# Patient Record
Sex: Male | Born: 1955 | Race: Black or African American | Hispanic: No | Marital: Married | Smoking: Current some day smoker
Health system: Southern US, Community
[De-identification: ages and names within clinical notes are randomized; demographics above are authoritative.]

## PROBLEM LIST (undated history)

## (undated) DIAGNOSIS — C3490 Malignant neoplasm of unspecified part of unspecified bronchus or lung: Secondary | ICD-10-CM

## (undated) DIAGNOSIS — N529 Male erectile dysfunction, unspecified: Secondary | ICD-10-CM

## (undated) DIAGNOSIS — I10 Essential (primary) hypertension: Secondary | ICD-10-CM

## (undated) DIAGNOSIS — E785 Hyperlipidemia, unspecified: Secondary | ICD-10-CM

## (undated) HISTORY — DX: Male erectile dysfunction, unspecified: N52.9

## (undated) HISTORY — DX: Hyperlipidemia, unspecified: E78.5

## (undated) HISTORY — DX: Essential (primary) hypertension: I10

## (undated) HISTORY — PX: APPENDECTOMY: SHX54

## (undated) HISTORY — PX: COLONOSCOPY: SHX5424

---

## 2004-08-13 ENCOUNTER — Observation Stay (HOSPITAL_COMMUNITY): Admission: AD | Admit: 2004-08-13 | Discharge: 2004-08-14 | Payer: Self-pay | Admitting: Family Medicine

## 2008-11-07 ENCOUNTER — Encounter: Payer: Self-pay | Admitting: Internal Medicine

## 2008-12-05 ENCOUNTER — Ambulatory Visit: Payer: Self-pay | Admitting: Internal Medicine

## 2008-12-05 ENCOUNTER — Ambulatory Visit (HOSPITAL_COMMUNITY): Admission: RE | Admit: 2008-12-05 | Discharge: 2008-12-05 | Payer: Self-pay | Admitting: Internal Medicine

## 2008-12-09 LAB — HM COLONOSCOPY

## 2010-11-09 NOTE — Op Note (Signed)
[name], [name]            ACCOUNT NO.:  [id number]   MEDICAL RECORD NO.:  [id number]          PATIENT TYPE:  AMB   LOCATION:  DAY                           FACILITY:  [facility]   PHYSICIAN:  R. Roetta Sessions, M.D. DATE OF BIRTH:  [birth year]   DATE OF PROCEDURE:  12/05/2008  DATE OF DISCHARGE:                                PROCEDURE NOTE   PROCEDURE:  Screening colonoscopy.   INDICATIONS FOR PROCEDURE:  [age]-year-old gentleman, referred out of  courtesy of Dr. Simone Curia for colorectal cancer screening.  He is  devoid of any lower GI tract symptoms.  There is no family history of  colon polyps or colon cancer.  He has never had his lower GI tract  evaluated previously.  He is here for standard screening.   Risks, benefits, alternatives and limitations have been discussed,  questions answered.  He is agreeable.  Please see the documentation in  the medical record.   PROCEDURE NOTE:  O2 saturation, blood pressure, pulse, respirations were  monitored throughout the entire procedure.  Conscious sedation:  Versed  4 mg IV, Demerol 100 mg IV in divided doses.  Instrument:  Pentax video  chip system.   FINDINGS:  Digital rectal exam revealed no abnormalities on scout  findings.  Prep was good.  However, it was relatively poor on the right  side.   Colon:  Colonic mucosa was surveyed from the rectosigmoid junction to  the left transverse, right colon, to the appendiceal orifice, ileocecal  valve, and cecum.  These structures were seen and photographed for the  record.  From this level, the scope was slowly and cautiously withdrawn.  All previously-mentioned mucosal surfaces were again seen.  The patient  had a long, tortuous colon, requiring external abdominal pressure to  reach the cecum to prevent looping.  There was a thin coating of  tenacious stool on the ascending segment, which had to be copiously  washed and lavaged to gain adequate visualization.  The colonic mucosa  appeared normal.  The scope was pulled down into the rectum, where  thorough examination of the rectal mucosa, including retroflexed view of  the anal verge, demonstrated no abnormalities.  The patient tolerated  the procedure well and was reacted in endoscopy.   Cecal withdrawal time:  8 minutes.   IMPRESSION:  Long, tortuous, but otherwise normal-appearing colon.  Normal rectum.   RECOMMENDATIONS:  Repeat screening colonoscopy ten years.      Jonathon Bellows, M.D.  Electronically Signed     RMR/MEDQ  D:  12/05/2008  T:  12/05/2008  Job:  [id number]   cc:   Donna Bernard, M.D.  Fax: [phone]

## 2010-11-12 NOTE — Op Note (Signed)
[name], [name] NO.:  [id number]   MEDICAL RECORD NO.:  [id number]          PATIENT TYPE:  INP   LOCATION:  A322                          FACILITY:  [facility]   PHYSICIAN:  Dalia Heading, M.D.  DATE OF BIRTH:  1955-08-04   DATE OF PROCEDURE:  08/13/2004  DATE OF DISCHARGE:                                 OPERATIVE REPORT   PREOPERATIVE DIAGNOSIS:  Acute appendicitis.   POSTOPERATIVE DIAGNOSIS:  Acute appendicitis.   PROCEDURE:  Laparoscopic appendectomy.   SURGEON:  Dr. Franky Macho.   ANESTHESIA:  General endotracheal.   INDICATIONS:  The patient is a 55 year old white male who presents with  right lower quadrant abdominal pain. CT scan of the pelvis reveals acute  appendicitis. Risks and benefits of the procedure, including bleeding,  infection, the possibility of an open procedure were fully explained to the  patient, who gave informed consent.   PROCEDURE NOTE:  The patient placed in supine position. After induction of  general endotracheal anesthesia, the abdomen was prepped and draped in the  usual sterile technique with Betadine. Surgical site confirmation was  performed.   The supraumbilical incision was made down to the fascia. Veress needle was  introduced into the abdominal cavity and confirmation of placement was done  using the saline drop test. The abdomen was then insufflated to 16 mmHg  pressure. An 11 mm trocar was introduced into the abdominal cavity under  direct visualization without difficulty. The patient was placed in deep  Trendelenburg position. An additional 12-mm trocar was placed a suprapubic  region, and 5 mm trocars placed in the left lower quadrant region. The  appendix was visualized and noted to be acutely inflamed in the distal half.  It was an elongated, retrocecal appendix. The mesoappendix was divided using  the harmonic scalpel. A vascular Endo-GIA was placed across the base of the  appendix and fired. The staple  line was noted to be intact. No abnormal  bleeding was noted at the end of procedure. The right lower quadrant was  copiously irrigated with normal saline. All fluid and air was evacuated from  the abdominal cavity. The appendix was removed using EndoCatch bag without  difficulty. All trocars were removed without difficulty.   The wounds was irrigated with normal saline. The supraumbilical fascia as  well as suprapubic fascia were reapproximated using single Vicryl  interrupted sutures. All skin incisions were closed using staples, and 0.5  cm Sensorcaine was instilled into the surrounding wound. Betadine ointment,  dry sterile dressings were applied.   All tape and needle counts were correct at the end of the procedure. The  patient was extubated in the operating room and went back to recovery room  awake in stable condition.   COMPLICATIONS:  None.   SPECIMEN:  Appendix.   BLOOD LOSS:  Minimal.      MAJ/MEDQ  D:  08/13/2004  T:  08/13/2004  Job:  [id number]   cc:   Lorin Picket A. Gerda Diss, MD  [address]  [place]  [place] [address]  Fax: [phone]

## 2010-11-12 NOTE — H&P (Signed)
NAMEKENTLEY, [name]            ACCOUNT NO.:  [id number]   MEDICAL RECORD NO.:  [id number]          PATIENT TYPE:  INP   LOCATION:  A322                          FACILITY:  [facility]   PHYSICIAN:  Scott A. Gerda Diss, MD    DATE OF BIRTH:  July 30, 1955   DATE OF ADMISSION:  08/13/2004  DATE OF DISCHARGE:  LH                                HISTORY & PHYSICAL   CHIEF COMPLAINT:  Abdominal pain.   HISTORY OF PRESENT ILLNESS:  The patient states he started yesterday with  abdominal pain that started in the mid abdominal area for about 3 to 4  hours.  He then had progressive nausea and then vomiting and a loss of  appetite.  He did have a little bit of low-grade fever today, no diarrhea,  he denies cough, congestion, runny nose. Denies dysuria, hematuria, bloody  stools. He states that the abdominal pain is fairly intense, more in the  right lower quadrant at this point and it is hard for him to stand and walk  because of the pain.   FAMILY HISTORY:  Hypertension.   PAST MEDICAL HISTORY:  Negative.   SURGICAL HISTORY:  Negative.   HEALTH PROBLEMS:  Overall in good health. Does not come to the doctor often.  Has had regular physicals once a year over the past several years and other  than smoking and slight elevation of cholesterol has been negative.   SOCIAL HISTORY:  He does smoke approximately a pack or less per day.   ALLERGIES:  None.   MEDICATIONS:  None.   LABORATORY DATA:  CBC 9.9 thousand white count, 89 segs. Met-7 overall good.  Amylase normal.   PHYSICAL EXAMINATION:  HEENT:  Tympanic membranes normal.  NECK:  Supple.  CHEST:  Clear to auscultation. No crackles.  HEART:  Regular, no murmurs.  ABDOMEN:  Soft with moderate tenderness in the right lower quadrant.  EXTREMITIES:  No edema.  SKIN:  Warm and dry.  NEUROLOGIC:  Grossly normal.   It should be noted that he does have a family history of what appears to be  a possibility of Huntington's chorea but he is not  exhibiting any of these  neurologic signs.   ASSESSMENT/PLAN:  Abdominal pain - concerning for the possibility of  appendicitis. I recommend observation, cover with antibiotics. CBC although  the white count looks good there is a left shift. We are awaiting CT scan to  see what that shows. Dr. Lovell Sheehan has been spoken with and will be following  along as well.      SAL/MEDQ  D:  08/13/2004  T:  08/13/2004  Job:  [id number]

## 2012-10-29 ENCOUNTER — Other Ambulatory Visit: Payer: Self-pay | Admitting: Family Medicine

## 2012-10-30 ENCOUNTER — Encounter: Payer: Self-pay | Admitting: *Deleted

## 2013-05-18 ENCOUNTER — Other Ambulatory Visit: Payer: Self-pay | Admitting: Family Medicine

## 2013-06-24 ENCOUNTER — Other Ambulatory Visit: Payer: Self-pay | Admitting: Family Medicine

## 2013-06-28 ENCOUNTER — Encounter: Payer: Self-pay | Admitting: Family Medicine

## 2013-06-28 ENCOUNTER — Ambulatory Visit (INDEPENDENT_AMBULATORY_CARE_PROVIDER_SITE_OTHER): Payer: BC Managed Care – PPO | Admitting: Family Medicine

## 2013-06-28 ENCOUNTER — Ambulatory Visit: Payer: Self-pay | Admitting: Nurse Practitioner

## 2013-06-28 VITALS — BP 140/90 | Temp 98.8°F | Ht 74.0 in | Wt 280.2 lb

## 2013-06-28 DIAGNOSIS — J019 Acute sinusitis, unspecified: Secondary | ICD-10-CM

## 2013-06-28 DIAGNOSIS — B349 Viral infection, unspecified: Secondary | ICD-10-CM

## 2013-06-28 DIAGNOSIS — B9789 Other viral agents as the cause of diseases classified elsewhere: Secondary | ICD-10-CM

## 2013-06-28 MED ORDER — LEVOFLOXACIN 500 MG PO TABS: 500.0000 mg | ORAL_TABLET | Freq: Every day | ORAL | Status: AC

## 2013-06-28 NOTE — Progress Notes (Signed)
   Subjective:    Patient ID: [name], male    DOB: [birth year], 58 y.o.   MRN: [id number]  Cough This is a new problem. The current episode started in the past 7 days. Associated symptoms include myalgias, nasal congestion, a sore throat and sweats.  started 3 days ago with throat and drainage Some body aches This am felt better No fever but felt chilled, felt warm last night No fatigue He states is actually down a little bit better. PMH benign  Review of Systems  HENT: Positive for sore throat.   Respiratory: Positive for cough.   Musculoskeletal: Positive for myalgias.       Objective:   Physical Exam  Nursing note and vitals reviewed. Constitutional: He appears well-developed.  HENT:  Head: Normocephalic.  Mouth/Throat: Oropharynx is clear and moist. No oropharyngeal exudate.  Neck: Normal range of motion.  Cardiovascular: Normal rate, regular rhythm and normal heart sounds.   No murmur heard. Pulmonary/Chest: Effort normal and breath sounds normal. He has no wheezes.  Lymphadenopathy:    He has no cervical adenopathy.  Neurological: He exhibits normal muscle tone.  Skin: Skin is warm and dry.          Assessment & Plan:  Viral syndrome with secondary sinusitis antibiotics prescribed warning signs discussed he'll get these antibiotics filled if he does not improve over the course of next few days if he gets better there is no need to do the antibiotics.

## 2013-07-12 ENCOUNTER — Ambulatory Visit (INDEPENDENT_AMBULATORY_CARE_PROVIDER_SITE_OTHER): Payer: BC Managed Care – PPO | Admitting: Family Medicine

## 2013-07-12 ENCOUNTER — Encounter: Payer: Self-pay | Admitting: Family Medicine

## 2013-07-12 VITALS — BP 142/90 | Ht 74.0 in | Wt 287.2 lb

## 2013-07-12 DIAGNOSIS — Z79899 Other long term (current) drug therapy: Secondary | ICD-10-CM

## 2013-07-12 DIAGNOSIS — Z125 Encounter for screening for malignant neoplasm of prostate: Secondary | ICD-10-CM

## 2013-07-12 DIAGNOSIS — I10 Essential (primary) hypertension: Secondary | ICD-10-CM | POA: Insufficient documentation

## 2013-07-12 DIAGNOSIS — E785 Hyperlipidemia, unspecified: Secondary | ICD-10-CM | POA: Insufficient documentation

## 2013-07-12 DIAGNOSIS — M722 Plantar fascial fibromatosis: Secondary | ICD-10-CM

## 2013-07-12 DIAGNOSIS — E782 Mixed hyperlipidemia: Secondary | ICD-10-CM

## 2013-07-12 MED ORDER — LISINOPRIL 20 MG PO TABS: 20.0000 mg | ORAL_TABLET | Freq: Every day | ORAL | Status: DC

## 2013-07-12 NOTE — Progress Notes (Signed)
   Subjective:    Patient ID: [name], male    DOB: [birth year], 58 y.o.   MRN: [id number]  HPI Patient arrives for a follow up on blood pressure. In the end did not have to take the antibiotic. daught er also got sick  Compliant with bp med. Takes it mostly in th e morn, Could do bp cks elsewhere. No obvious side effects from the blood pressure medicine.  Not a big salt eater, not exercising  Wears heavy steel toe, hurt with the ball of the foot, mostly left foot. Mostly stands all day  atient also having left leg pain -states he stands a lot at work.  Patient had history of impressive hyperlipidemia on blood testing 1 year ago. Admits to fair compliance in this regard. Also fair compliance with exercise as noted. Review of Systems No chest pain no back pain no abdominal pain no change in bowel habits no blood in stool ROS otherwise negative    Objective:   Physical Exam Alert vitals stable blood pressure still elevated 142/90 4 repeat. HET normal. Lungs clear. Heart rare in rhythm. 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 Increase lisinopril at 20 mg daily. Appropriate blood work. Diet exercise discussed. Check every 6 months. WSL

## 2013-07-13 LAB — BASIC METABOLIC PANEL
BUN: 21 mg/dL (ref 6–23)
CHLORIDE: 106 meq/L (ref 96–112)
CO2: 27 meq/L (ref 19–32)
Calcium: 9.6 mg/dL (ref 8.4–10.5)
Creat: 1.1 mg/dL (ref 0.50–1.35)
GLUCOSE: 95 mg/dL (ref 70–99)
POTASSIUM: 4.5 meq/L (ref 3.5–5.3)
SODIUM: 140 meq/L (ref 135–145)

## 2013-07-13 LAB — LIPID PANEL
CHOL/HDL RATIO: 4 ratio
Cholesterol: 194 mg/dL (ref 0–200)
HDL: 48 mg/dL (ref >39)
LDL CALC: 133 mg/dL — AB (ref 0–99)
Triglycerides: 64 mg/dL (ref <150)
VLDL: 13 mg/dL (ref 0–40)

## 2013-07-13 LAB — HEPATIC FUNCTION PANEL
ALT: 29 U/L (ref 0–53)
AST: 26 U/L (ref 0–37)
Albumin: 4.6 g/dL (ref 3.5–5.2)
Alkaline Phosphatase: 68 U/L (ref 39–117)
BILIRUBIN DIRECT: 0.1 mg/dL (ref 0.0–0.3)
BILIRUBIN TOTAL: 0.5 mg/dL (ref 0.3–1.2)
Indirect Bilirubin: 0.4 mg/dL (ref 0.0–0.9)
Total Protein: 6.6 g/dL (ref 6.0–8.3)

## 2013-07-14 LAB — PSA: PSA: 1.81 ng/mL (ref <=4.00)

## 2013-07-22 ENCOUNTER — Encounter: Payer: Self-pay | Admitting: Family Medicine

## 2013-12-29 ENCOUNTER — Other Ambulatory Visit: Payer: Self-pay | Admitting: Family Medicine

## 2014-02-01 ENCOUNTER — Other Ambulatory Visit: Payer: Self-pay | Admitting: Family Medicine

## 2014-03-02 ENCOUNTER — Other Ambulatory Visit: Payer: Self-pay | Admitting: Family Medicine

## 2014-03-12 ENCOUNTER — Encounter: Payer: Self-pay | Admitting: Family Medicine

## 2014-03-12 ENCOUNTER — Ambulatory Visit (INDEPENDENT_AMBULATORY_CARE_PROVIDER_SITE_OTHER): Payer: BC Managed Care – PPO | Admitting: Family Medicine

## 2014-03-12 VITALS — BP 144/94 | Ht 74.0 in | Wt 272.0 lb

## 2014-03-12 DIAGNOSIS — I10 Essential (primary) hypertension: Secondary | ICD-10-CM

## 2014-03-12 DIAGNOSIS — E785 Hyperlipidemia, unspecified: Secondary | ICD-10-CM

## 2014-03-12 DIAGNOSIS — R21 Rash and other nonspecific skin eruption: Secondary | ICD-10-CM

## 2014-03-12 MED ORDER — LISINOPRIL 20 MG PO TABS: ORAL_TABLET | ORAL | Status: DC

## 2014-03-12 NOTE — Progress Notes (Signed)
   Subjective:    Patient ID: [name], male    DOB: 08-11-1955, 58 y.o.   MRN: [id number]  HPI Patient is here today for 6 month follow up.   Patient would like to discuss removal of mole on left cheek.  stick with bp meds and exercise  Exercise poor   hese days   Day numb call the house,    Review of Systems No chest pain no back pain no abdominal pain no change in bowel habits no blood in stool ROS otherwise negative    Objective:   Physical Exam  Alert no acute distress blood pressure good on repeat 1 3482 H&T normal. Lungs clear. Heart rare in rhythm. Ankles edema. 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 #3 hyperlipidemia evident. Has been working so so on diet. Plan diet discussed. Compliance with meds discussed. Patient to get flu shot at work. Dermatology referral rationale discussed. WSL

## 2014-03-24 ENCOUNTER — Encounter: Payer: Self-pay | Admitting: Family Medicine

## 2014-09-26 ENCOUNTER — Other Ambulatory Visit: Payer: Self-pay | Admitting: Family Medicine

## 2014-10-09 ENCOUNTER — Telehealth: Payer: Self-pay | Admitting: Family Medicine

## 2014-10-09 DIAGNOSIS — Z79899 Other long term (current) drug therapy: Secondary | ICD-10-CM

## 2014-10-09 DIAGNOSIS — E785 Hyperlipidemia, unspecified: Secondary | ICD-10-CM

## 2014-10-09 DIAGNOSIS — Z125 Encounter for screening for malignant neoplasm of prostate: Secondary | ICD-10-CM

## 2014-10-09 NOTE — Telephone Encounter (Signed)
Rep all same

## 2014-10-09 NOTE — Telephone Encounter (Signed)
Pt is requesting lab orders to be sent over. Last labs per epic were: Lipid,hepatic,bmp,psa on 07/13/13

## 2014-10-10 NOTE — Telephone Encounter (Signed)
bw orders ready. Pt notified on voicemail.

## 2014-10-20 LAB — HEPATIC FUNCTION PANEL
ALK PHOS: 77 IU/L (ref 39–117)
ALT: 32 IU/L (ref 0–44)
AST: 29 IU/L (ref 0–40)
Albumin: 4.6 g/dL (ref 3.5–5.5)
Bilirubin Total: 0.5 mg/dL (ref 0.0–1.2)
Bilirubin, Direct: 0.18 mg/dL (ref 0.00–0.40)
Total Protein: 6.5 g/dL (ref 6.0–8.5)

## 2014-10-20 LAB — LIPID PANEL
CHOLESTEROL TOTAL: 218 mg/dL — AB (ref 100–199)
Chol/HDL Ratio: 4 ratio units (ref 0.0–5.0)
HDL: 55 mg/dL (ref >39)
LDL Calculated: 150 mg/dL — ABNORMAL HIGH (ref 0–99)
Triglycerides: 67 mg/dL (ref 0–149)
VLDL Cholesterol Cal: 13 mg/dL (ref 5–40)

## 2014-10-20 LAB — BASIC METABOLIC PANEL
BUN/Creatinine Ratio: 12 (ref 9–20)
BUN: 13 mg/dL (ref 6–24)
CO2: 25 mmol/L (ref 18–29)
CREATININE: 1.09 mg/dL (ref 0.76–1.27)
Calcium: 9 mg/dL (ref 8.7–10.2)
Chloride: 100 mmol/L (ref 97–108)
GFR calc Af Amer: 87 mL/min/{1.73_m2} (ref >59)
GFR calc non Af Amer: 75 mL/min/{1.73_m2} (ref >59)
GLUCOSE: 103 mg/dL — AB (ref 65–99)
POTASSIUM: 4.7 mmol/L (ref 3.5–5.2)
Sodium: 141 mmol/L (ref 134–144)

## 2014-10-20 LAB — PSA: PSA: 2.1 ng/mL (ref 0.0–4.0)

## 2014-10-22 ENCOUNTER — Encounter: Payer: Self-pay | Admitting: Family Medicine

## 2014-10-22 ENCOUNTER — Ambulatory Visit (INDEPENDENT_AMBULATORY_CARE_PROVIDER_SITE_OTHER): Payer: BLUE CROSS/BLUE SHIELD | Admitting: Family Medicine

## 2014-10-22 VITALS — Ht 74.0 in

## 2014-10-22 DIAGNOSIS — I10 Essential (primary) hypertension: Secondary | ICD-10-CM

## 2014-10-22 DIAGNOSIS — E782 Mixed hyperlipidemia: Secondary | ICD-10-CM

## 2014-10-22 DIAGNOSIS — Z0000 Encounter for general adult medical examination without abnormal findings: Secondary | ICD-10-CM

## 2014-10-22 MED ORDER — PRAVASTATIN SODIUM 20 MG PO TABS: 20.0000 mg | ORAL_TABLET | Freq: Every day | ORAL | Status: DC

## 2014-10-22 MED ORDER — LISINOPRIL 20 MG PO TABS: ORAL_TABLET | ORAL | Status: DC

## 2014-10-22 NOTE — Progress Notes (Signed)
Subjective:    Patient ID: [name], male    DOB: [birth year], 57 y.o.   MRN: [id number]  HPI  The patient comes in today for a wellness visit.  Working at [employer]  12 hr day time shift'  Results for orders placed or performed in visit on 10/09/14  Lipid panel  Result Value Ref Range   Cholesterol, Total 218 (H) 100 - 199 mg/dL   Triglycerides 67 0 - 149 mg/dL   HDL 55 >39 mg/dL   VLDL Cholesterol Cal 13 5 - 40 mg/dL   LDL Calculated 150 (H) 0 - 99 mg/dL   Chol/HDL Ratio 4.0 0.0 - 5.0 ratio units  Hepatic function panel  Result Value Ref Range   Total Protein 6.5 6.0 - 8.5 g/dL   Albumin 4.6 3.5 - 5.5 g/dL   Bilirubin Total 0.5 0.0 - 1.2 mg/dL   Bilirubin, Direct 0.18 0.00 - 0.40 mg/dL   Alkaline Phosphatase 77 39 - 117 IU/L   AST 29 0 - 40 IU/L   ALT 32 0 - 44 IU/L  Basic metabolic panel  Result Value Ref Range   Glucose 103 (H) 65 - 99 mg/dL   BUN 13 6 - 24 mg/dL   Creatinine, Ser 1.09 0.76 - 1.27 mg/dL   GFR calc non Af Amer 75 >59 mL/min/1.73   GFR calc Af Amer 87 >59 mL/min/1.73   BUN/Creatinine Ratio 12 9 - 20   Sodium 141 134 - 144 mmol/L   Potassium 4.7 3.5 - 5.2 mmol/L   Chloride 100 97 - 108 mmol/L   CO2 25 18 - 29 mmol/L   Calcium 9.0 8.7 - 10.2 mg/dL  PSA  Result Value Ref Range   PSA 2.1 0.0 - 4.0 ng/mL   Eats b fast well  BP meds faithfully. cks auto cuff regulalry usually good  A review of their health history was completed.  A review of medications was also completed.  Any needed refills; no  Eating habits: try to eat healthy  Falls/  MVA accidents in past few months: no  Regular exercise: rarely  Specialist pt sees on regular basis: no  Preventative health issues were discussed.   Additional concerns: none Colon done 20110 give n ten yr block   Patient has long-standing history of high blood pressure. Compliant with medicine obvious side effects. Next  Patient has history of hyperlipidemia. Has tried to cut down  fats in the past on his own. Positive family history of stroke  Review of Systems  Constitutional: Negative for fever, activity change and appetite change.  HENT: Negative for congestion and rhinorrhea.   Eyes: Negative for discharge.  Respiratory: Negative for cough and wheezing.   Cardiovascular: Negative for chest pain.  Gastrointestinal: Negative for vomiting, abdominal pain and blood in stool.  Genitourinary: Negative for frequency and difficulty urinating.  Musculoskeletal: Negative for neck pain.  Skin: Negative for rash.  Allergic/Immunologic: Negative for environmental allergies and food allergies.  Neurological: Negative for weakness and headaches.  Psychiatric/Behavioral: Negative for agitation.  All other systems reviewed and are negative.      Objective:   Physical Exam  Constitutional: He appears well-developed and well-nourished.  HENT:  Head: Normocephalic and atraumatic.  Right Ear: External ear normal.  Left Ear: External ear normal.  Nose: Nose normal.  Mouth/Throat: Oropharynx is clear and moist.  Eyes: EOM are normal. Pupils are equal, round, and reactive to light.  Neck: Normal range of motion. Neck supple. No  thyromegaly present.  Cardiovascular: Normal rate, regular rhythm and normal heart sounds.   No murmur heard. Pulmonary/Chest: Effort normal and breath sounds normal. No respiratory distress. He has no wheezes.  Abdominal: Soft. Bowel sounds are normal. He exhibits no distension and no mass. There is no tenderness.  Genitourinary: Prostate normal and penis normal.  Musculoskeletal: Normal range of motion. He exhibits no edema.  Lymphadenopathy:    He has no cervical adenopathy.  Neurological: He is alert. He exhibits normal muscle tone.  Skin: Skin is warm and dry. No erythema.  Psychiatric: He has a normal mood and affect. His behavior is normal. Judgment normal.  Vitals reviewed.         Assessment & Plan:  Impression 1 wellness exam  up-to-date on colonoscopy #2 hypertension good control #3 hyperlipidemia discussed at length time to initiate medication. Plan add Pravachol rationale discussed. Maintain same blood pressure medicine. Blood work reviewed. Diet exercise discussed. Recheck in 6 months. Blood work then [employer]

## 2015-04-09 ENCOUNTER — Other Ambulatory Visit: Payer: Self-pay | Admitting: Family Medicine

## 2015-04-23 ENCOUNTER — Ambulatory Visit: Payer: BLUE CROSS/BLUE SHIELD | Admitting: Family Medicine

## 2015-05-05 ENCOUNTER — Other Ambulatory Visit: Payer: Self-pay | Admitting: Family Medicine

## 2015-05-12 ENCOUNTER — Ambulatory Visit (INDEPENDENT_AMBULATORY_CARE_PROVIDER_SITE_OTHER): Payer: BLUE CROSS/BLUE SHIELD | Admitting: Family Medicine

## 2015-05-12 ENCOUNTER — Encounter: Payer: Self-pay | Admitting: Family Medicine

## 2015-05-12 VITALS — BP 130/86 | Ht 74.0 in | Wt 267.4 lb

## 2015-05-12 DIAGNOSIS — E785 Hyperlipidemia, unspecified: Secondary | ICD-10-CM

## 2015-05-12 DIAGNOSIS — I10 Essential (primary) hypertension: Secondary | ICD-10-CM

## 2015-05-12 MED ORDER — LISINOPRIL 20 MG PO TABS: ORAL_TABLET | ORAL | Status: DC

## 2015-05-12 MED ORDER — PRAVASTATIN SODIUM 20 MG PO TABS: ORAL_TABLET | ORAL | Status: DC

## 2015-05-12 NOTE — Progress Notes (Signed)
   Subjective:    Patient ID: [name], male    DOB: 18-Jun-1956, 59 y.o.   MRN: [id number]  Hypertension This is a chronic problem. The current episode started more than 1 year ago. Risk factors for coronary artery disease include dyslipidemia and male gender. Treatments tried: lisinopril. There are no compliance problems.    Patient also arrives office with blood work. Obtained fasting in nature.  Compliant with cholesterol medication. No obvious side effects. Watching diet closely.    Blood work thru work  Review of Systems No headache no chest pain no back pain ROS otherwise negative    Objective:   Physical Exam Alert vitals stable blood pressure good on repeat HEENT normal. Lungs clear. Heart regular in rhythm.       Assessment & Plan:  Impression 1 hypertension good control meds reviewed discussed #2 hyperlipidemia good control measures discussed plan maintain same medications. Blood work reviewed. Follow-up in 6 months. WSL

## 2015-10-22 DIAGNOSIS — H40053 Ocular hypertension, bilateral: Secondary | ICD-10-CM | POA: Diagnosis not present

## 2015-11-05 ENCOUNTER — Ambulatory Visit (INDEPENDENT_AMBULATORY_CARE_PROVIDER_SITE_OTHER): Payer: BLUE CROSS/BLUE SHIELD | Admitting: Family Medicine

## 2015-11-05 ENCOUNTER — Encounter: Payer: Self-pay | Admitting: Family Medicine

## 2015-11-05 VITALS — BP 150/92 | Ht 73.25 in | Wt 274.0 lb

## 2015-11-05 DIAGNOSIS — I10 Essential (primary) hypertension: Secondary | ICD-10-CM | POA: Diagnosis not present

## 2015-11-05 DIAGNOSIS — E785 Hyperlipidemia, unspecified: Secondary | ICD-10-CM

## 2015-11-05 DIAGNOSIS — Z0000 Encounter for general adult medical examination without abnormal findings: Secondary | ICD-10-CM

## 2015-11-05 DIAGNOSIS — Z79899 Other long term (current) drug therapy: Secondary | ICD-10-CM | POA: Diagnosis not present

## 2015-11-05 MED ORDER — LISINOPRIL 20 MG PO TABS: ORAL_TABLET | ORAL | Status: DC

## 2015-11-05 MED ORDER — PRAVASTATIN SODIUM 20 MG PO TABS: ORAL_TABLET | ORAL | Status: DC

## 2015-11-05 NOTE — Progress Notes (Signed)
   Subjective:    Patient ID: [name], male    DOB: [birth year], 60 y.o.   MRN: [id number]  HPI The patient comes in today for a wellness visit.  Last colonoscopy 7 years ago  A review of their health history was completed.  A review of medications was also completed.  Any needed refills; yes all meds  Eating habits: health conscious  Falls/  MVA accidents in past few months: none  Regular exercise: rides bike some  Specialist pt sees on regular basis: none  Preventative health issues were discussed.   Additional concerns:   Rides a bike at work, none off and on  Numbers decent wen cked elsewhere  Chol med compliant. Diet pretty good, eats healthy   No fam hx of colon ca or prost ca  Needs lip and liver prof done   Right handed notes shoulde pain, morn pain and discomfort in the shoulder  Elbow has usee a copper brace and has helped  wiill take aleave and it helps  Patient compliant with his blood pressure medication. States does not miss a dose. Claims one blood pressure checked elsewhere that it's in good control. Next  Claims compliance with lipid medication no obvious side effects from trying to watch his diet  Review of Systems  Constitutional: Negative for fever, activity change and appetite change.  HENT: Negative for congestion and rhinorrhea.   Eyes: Negative for discharge.  Respiratory: Negative for cough and wheezing.   Cardiovascular: Negative for chest pain.  Gastrointestinal: Negative for vomiting, abdominal pain and blood in stool.  Genitourinary: Negative for frequency and difficulty urinating.  Musculoskeletal: Negative for neck pain.  Skin: Negative for rash.  Allergic/Immunologic: Negative for environmental allergies and food allergies.  Neurological: Negative for weakness and headaches.  Psychiatric/Behavioral: Negative for agitation.  All other systems reviewed and are negative.      Objective:   Physical Exam    Constitutional: He appears well-developed and well-nourished.  HENT:  Head: Normocephalic and atraumatic.  Right Ear: External ear normal.  Left Ear: External ear normal.  Nose: Nose normal.  Mouth/Throat: Oropharynx is clear and moist.  Eyes: EOM are normal. Pupils are equal, round, and reactive to light.  Neck: Normal range of motion. Neck supple. No thyromegaly present.  Cardiovascular: Normal rate, regular rhythm and normal heart sounds.   No murmur heard. Pulmonary/Chest: Effort normal and breath sounds normal. No respiratory distress. He has no wheezes.  Abdominal: Soft. Bowel sounds are normal. He exhibits no distension and no mass. There is no tenderness.  Genitourinary: Penis normal.  Musculoskeletal: Normal range of motion. He exhibits no edema.  Lymphadenopathy:    He has no cervical adenopathy.  Neurological: He is alert. He exhibits normal muscle tone.  Skin: Skin is warm and dry. No erythema.  Psychiatric: He has a normal mood and affect. His behavior is normal. Judgment normal.  Vitals reviewed.         Assessment & Plan:  Impression 1 wellness exam #2 hypertension generally good control though not good here today. Will obtain maintain medicine for same for now #3 hyperlipidemia exact status uncertain old blood work reviewed to maintain same pending blood work #4 right shoulder impingement sign discussed plan diet exercise discussed. Hemoccult cards. Appropriate blood work. Maintain all medications. Recheck in 6 months.

## 2015-11-19 DIAGNOSIS — Z79899 Other long term (current) drug therapy: Secondary | ICD-10-CM | POA: Diagnosis not present

## 2015-11-19 DIAGNOSIS — E785 Hyperlipidemia, unspecified: Secondary | ICD-10-CM | POA: Diagnosis not present

## 2015-11-20 ENCOUNTER — Telehealth: Payer: Self-pay | Admitting: Family Medicine

## 2015-11-20 LAB — LIPID PANEL
CHOL/HDL RATIO: 3.1 ratio (ref 0.0–5.0)
Cholesterol, Total: 174 mg/dL (ref 100–199)
HDL: 56 mg/dL (ref >39)
LDL CALC: 107 mg/dL — AB (ref 0–99)
TRIGLYCERIDES: 56 mg/dL (ref 0–149)
VLDL Cholesterol Cal: 11 mg/dL (ref 5–40)

## 2015-11-20 LAB — HEPATIC FUNCTION PANEL
ALT: 30 IU/L (ref 0–44)
AST: 32 IU/L (ref 0–40)
Albumin: 4.5 g/dL (ref 3.5–5.5)
Alkaline Phosphatase: 74 IU/L (ref 39–117)
BILIRUBIN TOTAL: 0.3 mg/dL (ref 0.0–1.2)
BILIRUBIN, DIRECT: 0.13 mg/dL (ref 0.00–0.40)
TOTAL PROTEIN: 6.5 g/dL (ref 6.0–8.5)

## 2015-11-20 NOTE — Telephone Encounter (Signed)
Left message to return call 

## 2015-11-20 NOTE — Telephone Encounter (Signed)
Add amlodipine 5 mg 1 daily, #30, patient needs office visit preferably next week with Dr. Richardson Landry, continue other medications, bring all medications to the office visit I am willing to see him this week if he can come in to be seen today, if excessive dizziness or lightheadedness I do not recommend driving or operating dangerous equipment while feeling this way

## 2015-11-20 NOTE — Telephone Encounter (Signed)
Pt seen for PE on 5/11 discussed his elevated BP at that time Was told to keep track he is calling back today to address the  Fact that he feels it is still running high. Is feeling dizzy headed, Swimmy headed feeling. High 198/or 100 something he can't  Remember the bottom number   Please advise   Pt is working in [place] and not sure he can get here today

## 2015-11-21 ENCOUNTER — Encounter: Payer: Self-pay | Admitting: Family Medicine

## 2015-11-23 ENCOUNTER — Encounter: Payer: Self-pay | Admitting: Family Medicine

## 2015-11-26 ENCOUNTER — Other Ambulatory Visit: Payer: Self-pay

## 2015-11-26 DIAGNOSIS — Z0000 Encounter for general adult medical examination without abnormal findings: Secondary | ICD-10-CM

## 2015-11-26 LAB — POC HEMOCCULT BLD/STL (HOME/3-CARD/SCREEN)
Card #3 Fecal Occult Blood, POC: NEGATIVE
FECAL OCCULT BLD: NEGATIVE
Fecal Occult Blood, POC: NEGATIVE

## 2015-11-27 ENCOUNTER — Encounter: Payer: Self-pay | Admitting: Family Medicine

## 2015-11-27 ENCOUNTER — Ambulatory Visit (INDEPENDENT_AMBULATORY_CARE_PROVIDER_SITE_OTHER): Payer: BLUE CROSS/BLUE SHIELD | Admitting: Family Medicine

## 2015-11-27 VITALS — BP 142/90 | Ht 74.5 in | Wt 277.2 lb

## 2015-11-27 DIAGNOSIS — I10 Essential (primary) hypertension: Secondary | ICD-10-CM

## 2015-11-27 MED ORDER — LISINOPRIL 40 MG PO TABS: 40.0000 mg | ORAL_TABLET | Freq: Every day | ORAL | Status: DC

## 2015-11-27 NOTE — Progress Notes (Signed)
   Subjective:    Patient ID: PHU RECORD, male    DOB: 13-May-1956, 60 y.o.   MRN: [id number]  HPI Patient arrives for a follow up on blood pressure. Patient states his BP was elevated at work last Friday. Numb were quite high at work,  Slight pressure nasal and frontal, feelin a little light headed   Patient brings in some blood pre numbers reviewed. Several numbers quite elevated. Claims complete compliance with med Review of Systems No headache, no major weight loss or weight gain, no chest pain no back pain abdominal pain no change in bowel habits complete ROS otherwise negative     Objective:   Physical Exam Alert no apparent distress. H&T normal lungs clear.  REGULAR RATE AND RHYTHM> Blood preon repeat 152/90       Assessment & Plan:  Impression hypertension suboptimum options discussed plan double ACE inhibitor. Warning signs discussed follow-up as WSL

## 2016-04-21 DIAGNOSIS — H40053 Ocular hypertension, bilateral: Secondary | ICD-10-CM | POA: Diagnosis not present

## 2016-05-09 ENCOUNTER — Encounter: Payer: Self-pay | Admitting: Family Medicine

## 2016-05-09 ENCOUNTER — Ambulatory Visit (INDEPENDENT_AMBULATORY_CARE_PROVIDER_SITE_OTHER): Payer: BLUE CROSS/BLUE SHIELD | Admitting: Family Medicine

## 2016-05-09 VITALS — BP 140/82 | Ht 74.0 in | Wt 271.6 lb

## 2016-05-09 DIAGNOSIS — M25511 Pain in right shoulder: Secondary | ICD-10-CM

## 2016-05-09 DIAGNOSIS — E785 Hyperlipidemia, unspecified: Secondary | ICD-10-CM | POA: Diagnosis not present

## 2016-05-09 DIAGNOSIS — I10 Essential (primary) hypertension: Secondary | ICD-10-CM | POA: Diagnosis not present

## 2016-05-09 DIAGNOSIS — Z125 Encounter for screening for malignant neoplasm of prostate: Secondary | ICD-10-CM

## 2016-05-09 MED ORDER — PRAVASTATIN SODIUM 20 MG PO TABS: ORAL_TABLET | ORAL | 5 refills | Status: DC

## 2016-05-09 MED ORDER — LISINOPRIL 40 MG PO TABS: 40.0000 mg | ORAL_TABLET | Freq: Every day | ORAL | 1 refills | Status: DC

## 2016-05-09 NOTE — Progress Notes (Signed)
   Subjective:    Patient ID: [name], male    DOB: Sep 15, 1955, 60 y.o.   MRN: [id number]  Hypertension  This is a chronic problem. The current episode started more than 1 year ago. Risk factors for coronary artery disease include dyslipidemia and male gender. Treatments tried: lisinopril. There are no compliance problems.    Blood pressure medicine and blood pressure levels reviewed today with patient. Compliant with blood pressure medicine. States does not miss a dose. No obvious side effects. Blood pressure generally good when checked elsewhere. Watching salt intake.   Patient continues to take lipid medication regularly. No obvious side effects from it. Generally does not miss a dose. Prior blood work results are reviewed with patient. Patient continues to work on fat intake in diet  Patient with c/o right shoulder stiffness Still going on since last spring. Worse with certain motions. Comes and goes. Sharp at times. Recalls no sudden injury.   flus hot given at work   [employer] closely, working on things welll  Mostly good diet  BP usulaly up a  Bit, top numb around 150 or so , low numb low 90 to upper 80  Review of Systems No headache, no major weight loss or weight gain, no chest pain no back pain abdominal pain no change in bowel habits complete ROS otherwise negative     Objective:   Physical Exam Alert vitals stable, NAD. Blood pressure good on repeat. HEENT normal. Lungs clear. Heart regular rate and rhythm. Shoulder positive impingement sign on right deltoid normal no before meals tenderness no bicipital tenderness no deformity       Assessment & Plan:  Impression 1 hypertension discussed good control to maintain same on repeat numbers were excellent #2 hyperlipidemia status uncertain discussed maintain same pending #3 right shoulder impingement likely rotator cuff discussed patient wishes to hold off on major intervention at this time plan range of  motion exercises discussed encourage. Appropriate blood work. Medications refilled diet exercise discussed recheck in 6 months

## 2016-05-13 DIAGNOSIS — E785 Hyperlipidemia, unspecified: Secondary | ICD-10-CM | POA: Diagnosis not present

## 2016-05-13 DIAGNOSIS — I10 Essential (primary) hypertension: Secondary | ICD-10-CM | POA: Diagnosis not present

## 2016-05-13 DIAGNOSIS — Z125 Encounter for screening for malignant neoplasm of prostate: Secondary | ICD-10-CM | POA: Diagnosis not present

## 2016-05-14 LAB — HEPATIC FUNCTION PANEL
ALT: 20 IU/L (ref 0–44)
AST: 22 IU/L (ref 0–40)
Albumin: 4.6 g/dL (ref 3.5–5.5)
Alkaline Phosphatase: 70 IU/L (ref 39–117)
Bilirubin Total: 0.5 mg/dL (ref 0.0–1.2)
Bilirubin, Direct: 0.19 mg/dL (ref 0.00–0.40)
TOTAL PROTEIN: 6.5 g/dL (ref 6.0–8.5)

## 2016-05-14 LAB — BASIC METABOLIC PANEL
BUN/Creatinine Ratio: 8 — ABNORMAL LOW (ref 9–20)
BUN: 10 mg/dL (ref 6–24)
CALCIUM: 9.3 mg/dL (ref 8.7–10.2)
CHLORIDE: 102 mmol/L (ref 96–106)
CO2: 25 mmol/L (ref 18–29)
Creatinine, Ser: 1.27 mg/dL (ref 0.76–1.27)
GFR calc Af Amer: 72 mL/min/{1.73_m2} (ref >59)
GFR, EST NON AFRICAN AMERICAN: 62 mL/min/{1.73_m2} (ref >59)
Glucose: 98 mg/dL (ref 65–99)
POTASSIUM: 4.5 mmol/L (ref 3.5–5.2)
Sodium: 142 mmol/L (ref 134–144)

## 2016-05-14 LAB — LIPID PANEL
Chol/HDL Ratio: 3.2 ratio units (ref 0.0–5.0)
Cholesterol, Total: 164 mg/dL (ref 100–199)
HDL: 51 mg/dL (ref >39)
LDL Calculated: 102 mg/dL — ABNORMAL HIGH (ref 0–99)
TRIGLYCERIDES: 56 mg/dL (ref 0–149)
VLDL Cholesterol Cal: 11 mg/dL (ref 5–40)

## 2016-05-14 LAB — PSA: Prostate Specific Ag, Serum: 2 ng/mL (ref 0.0–4.0)

## 2016-05-15 ENCOUNTER — Encounter: Payer: Self-pay | Admitting: Family Medicine

## 2016-07-08 ENCOUNTER — Encounter: Payer: Self-pay | Admitting: Family Medicine

## 2016-07-08 ENCOUNTER — Ambulatory Visit (INDEPENDENT_AMBULATORY_CARE_PROVIDER_SITE_OTHER): Payer: BLUE CROSS/BLUE SHIELD | Admitting: Family Medicine

## 2016-07-08 VITALS — BP 130/88 | Temp 98.3°F | Ht 74.0 in | Wt 274.5 lb

## 2016-07-08 DIAGNOSIS — B9689 Other specified bacterial agents as the cause of diseases classified elsewhere: Secondary | ICD-10-CM | POA: Diagnosis not present

## 2016-07-08 DIAGNOSIS — J019 Acute sinusitis, unspecified: Secondary | ICD-10-CM

## 2016-07-08 MED ORDER — AMOXICILLIN-POT CLAVULANATE 875-125 MG PO TABS: 1.0000 | ORAL_TABLET | Freq: Two times a day (BID) | ORAL | 0 refills | Status: DC

## 2016-07-08 NOTE — Progress Notes (Signed)
   Subjective:    Patient ID: MACKEY VARRICCHIO, male    DOB: [birth year], 61 y.o.   MRN: [id number]  Sinusitis  This is a new problem. The current episode started in the past 7 days. The problem is unchanged. There has been no fever. The pain is moderate. Associated symptoms include congestion, coughing and headaches. (Wheezing) Treatments tried: Mucinex. The treatment provided no relief.   Patient presents today with significant head congestion drainage coughing symptoms been going on for a good week progressively worse with sinus pressure and pain   Review of Systems  HENT: Positive for congestion.   Respiratory: Positive for cough.   Neurological: Positive for headaches.       Objective:   Physical Exam Mild sinus tenderness ears normal throat is normal lungs are clear no crackles heart is regular no respiratory distress       Assessment & Plan:  Viral syndrome I do not find evidence of the flu Secondary sinus infection related to viral illness Antibiotic prescribed warning signs discuss in detail follow-up if any problems

## 2016-11-07 ENCOUNTER — Encounter: Payer: Self-pay | Admitting: Family Medicine

## 2016-11-07 ENCOUNTER — Ambulatory Visit (INDEPENDENT_AMBULATORY_CARE_PROVIDER_SITE_OTHER): Payer: BLUE CROSS/BLUE SHIELD | Admitting: Family Medicine

## 2016-11-07 VITALS — BP 152/108 | Ht 73.0 in | Wt 270.0 lb

## 2016-11-07 DIAGNOSIS — Z0000 Encounter for general adult medical examination without abnormal findings: Secondary | ICD-10-CM

## 2016-11-07 DIAGNOSIS — E7800 Pure hypercholesterolemia, unspecified: Secondary | ICD-10-CM

## 2016-11-07 DIAGNOSIS — I10 Essential (primary) hypertension: Secondary | ICD-10-CM | POA: Diagnosis not present

## 2016-11-07 MED ORDER — LISINOPRIL 40 MG PO TABS: 40.0000 mg | ORAL_TABLET | Freq: Every day | ORAL | 1 refills | Status: DC

## 2016-11-07 MED ORDER — PRAVASTATIN SODIUM 20 MG PO TABS: ORAL_TABLET | ORAL | 1 refills | Status: DC

## 2016-11-07 NOTE — Progress Notes (Signed)
   Subjective:    Patient ID: [name], male    DOB: 31-May-1956, 61 y.o.   MRN: [id number]  HPI The patient comes in today for a wellness visit.    A review of their health history was completed.  A review of medications was also completed.  Any needed refills; none today  Eating habits: eats healthy breaksfast, not so good at lunch and dinner  Falls/  MVA accidents in past few months: none  Regular exercise: very little  Specialist pt sees on regular basis: none  Preventative health issues were discussed.   Additional concerns: right shoulder pain. Started over one year ago.   Had bloodwork done at work. Has not gotten results yet. Pt states he will drop off copy of labs.   Blood pressure medicine and blood pressure levels reviewed today with patient. Compliant with blood pressure medicine. States does not miss a dose. No obvious side effects. Blood pressure generally good when checked elsewhere. Watching salt intake.  Patient continues to take lipid medication regularly. No obvious side effects from it. Generally does not miss a dose. Prior blood work results are reviewed with patient. Patient continues to work on fat intake in diet  Rides bike around the plant.  Overall diet is good   Review of Systems  Constitutional: Negative for activity change, appetite change and fever.  HENT: Negative for congestion and rhinorrhea.   Eyes: Negative for discharge.  Respiratory: Negative for cough and wheezing.   Cardiovascular: Negative for chest pain.  Gastrointestinal: Negative for abdominal pain, blood in stool and vomiting.  Genitourinary: Negative for difficulty urinating and frequency.  Musculoskeletal: Negative for neck pain.  Skin: Negative for rash.  Allergic/Immunologic: Negative for environmental allergies and food allergies.  Neurological: Negative for weakness and headaches.  Psychiatric/Behavioral: Negative for agitation.  All other systems reviewed and  are negative.      Objective:   Physical Exam  Constitutional: He appears well-developed and well-nourished.  HENT:  Head: Normocephalic and atraumatic.  Right Ear: External ear normal.  Left Ear: External ear normal.  Nose: Nose normal.  Mouth/Throat: Oropharynx is clear and moist.  Eyes: EOM are normal. Pupils are equal, round, and reactive to light.  Neck: Normal range of motion. Neck supple. No thyromegaly present.  Cardiovascular: Normal rate, regular rhythm and normal heart sounds.   No murmur heard. Pulmonary/Chest: Effort normal and breath sounds normal. No respiratory distress. He has no wheezes.  Abdominal: Soft. Bowel sounds are normal. He exhibits no distension and no mass. There is no tenderness.  Genitourinary: Penis normal.  Musculoskeletal: Normal range of motion. He exhibits no edema.  Lymphadenopathy:    He has no cervical adenopathy.  Neurological: He is alert. He exhibits normal muscle tone.  Skin: Skin is warm and dry. No erythema.  Psychiatric: He has a normal mood and affect. His behavior is normal. Judgment normal.  Vitals reviewed.   Due colon at age 60   Flu shot to do at 89  And shingles       Assessment & Plan:  Impression 1 well adult exam.Anticipatory guidance given. Diet discussed exercise discussed #2 hyperlipidemia good control discussed prior blood work discussed #3 hypertension good control discussed maintain same meds. Plan medications refilled. Diet exercise discussed. Patient does manifest right shoulder impingement sign. Discussed no need for further intervention. Follow-up in 6 months.

## 2016-12-23 ENCOUNTER — Other Ambulatory Visit: Payer: Self-pay | Admitting: Family Medicine

## 2017-05-09 ENCOUNTER — Ambulatory Visit: Payer: BLUE CROSS/BLUE SHIELD | Admitting: Family Medicine

## 2017-05-09 ENCOUNTER — Encounter: Payer: Self-pay | Admitting: Family Medicine

## 2017-05-09 ENCOUNTER — Telehealth: Payer: Self-pay | Admitting: Family Medicine

## 2017-05-09 VITALS — BP 146/92 | Ht 73.0 in | Wt 270.0 lb

## 2017-05-09 DIAGNOSIS — E7800 Pure hypercholesterolemia, unspecified: Secondary | ICD-10-CM

## 2017-05-09 DIAGNOSIS — I10 Essential (primary) hypertension: Secondary | ICD-10-CM | POA: Diagnosis not present

## 2017-05-09 DIAGNOSIS — J069 Acute upper respiratory infection, unspecified: Secondary | ICD-10-CM

## 2017-05-09 MED ORDER — LISINOPRIL 40 MG PO TABS: 40.0000 mg | ORAL_TABLET | Freq: Every day | ORAL | 1 refills | Status: DC

## 2017-05-09 MED ORDER — PRAVASTATIN SODIUM 20 MG PO TABS: ORAL_TABLET | ORAL | 5 refills | Status: DC

## 2017-05-09 NOTE — Telephone Encounter (Signed)
Pt dropped off recent lab results. In folder in office.

## 2017-05-09 NOTE — Progress Notes (Signed)
   Subjective:    Patient ID: [name], male    DOB: 11/03/1955, 61 y.o.   MRN: [id number] Patient arrives with several distinct concern Hypertension  This is a chronic problem. The current episode started more than 1 year ago. Risk factors for coronary artery disease include dyslipidemia and male gender. Treatments tried: lisinopril. There are no compliance problems.    Blood pressure medicine and blood pressure levels reviewed today with patient. Compliant with blood pressure medicine. States does not miss a dose. No obvious side effects. Blood pressure generally good when checked elsewhere. Watching salt intake.  Patient continues to take lipid medication regularly. No obvious side effects from it. Generally does not miss a dose. Prior blood work results are reviewed with patient. Patient continues to work on fat intake in diet   throat causing irritation last three or four days  Felt scratch , sore  No major runny nose'  occas cough, with throat sratch yy   Mouth dry ,,  Exercising regulaly  Rides ikes thru out the day,    fairm amnt of walking and eercise with the jo  Some exercise euip at house      Review of Systems No headache, no major weight loss or weight gain, no chest pain no back pain abdominal pain no change in bowel habits complete ROS otherwise negative     Objective:   Physical Exam  Alert and oriented, vitals reviewed and stable, NAD ENT-TM's and ext canals slight nasal congestion otherwise pharynx minimally erythematous discharge WNL bilat via otoscopic exam Soft palate, tonsils and post pharynx WNL via oropharyngeal exam Neck-symmetric, no masses; thyroid nonpalpable and nontender Pulmonary-no tachypnea or accessory muscle use; Clear without wheezes via auscultation Card--no abnrml murmurs, rhythm reg and rate WNL Carotid pulses symmetric, without bruits       Assessment & Plan:  ll impression 1 hypertension good control repeat  discussed to maintain same  2.  Hyperlipidemia.  Status uncertain.  Patient promises he will bring blood work results medications  3.  URI with pharyngitis highly likely viral.  Rationale discussed.  Symptom care discussed.  Call if persists beyond 1 week

## 2017-05-13 ENCOUNTER — Encounter: Payer: Self-pay | Admitting: Family Medicine

## 2017-05-15 MED ORDER — AMOXICILLIN-POT CLAVULANATE 875-125 MG PO TABS: 1.0000 | ORAL_TABLET | Freq: Two times a day (BID) | ORAL | 0 refills | Status: DC

## 2017-09-26 ENCOUNTER — Encounter: Payer: Self-pay | Admitting: Family Medicine

## 2017-09-27 NOTE — Telephone Encounter (Signed)
Left msg to return call to get more info on symptoms

## 2017-09-27 NOTE — Telephone Encounter (Signed)
Called pt and notified him he would need office visit. offerred appt tomorrow. He could not make it due to work. Offered Friday and told pt if worse needs to go to ED or hospital. No fever or sob at this time. pt verbalized understanding.

## 2017-09-29 ENCOUNTER — Encounter: Payer: Self-pay | Admitting: Family Medicine

## 2017-09-29 ENCOUNTER — Ambulatory Visit: Payer: BLUE CROSS/BLUE SHIELD | Admitting: Family Medicine

## 2017-09-29 VITALS — BP 168/100 | Temp 98.7°F | Ht 74.0 in | Wt 268.1 lb

## 2017-09-29 DIAGNOSIS — J019 Acute sinusitis, unspecified: Secondary | ICD-10-CM | POA: Diagnosis not present

## 2017-09-29 DIAGNOSIS — B9689 Other specified bacterial agents as the cause of diseases classified elsewhere: Secondary | ICD-10-CM

## 2017-09-29 MED ORDER — AMOXICILLIN-POT CLAVULANATE 875-125 MG PO TABS: 1.0000 | ORAL_TABLET | Freq: Two times a day (BID) | ORAL | 0 refills | Status: DC

## 2017-09-29 NOTE — Progress Notes (Signed)
   Subjective:    Patient ID: [name], male    DOB: 07-09-1955, 62 y.o.   MRN: [id number]  HPI Patient is here today with complaints of a cough and sinus drainage,sore throat,abd pain for a week and a half. He has been using otc cough and cold. Significant head congestion drainage coughing sinus pressure denies wheezing difficulty breathing sweats chills PMH benign Review of Systems  Constitutional: Negative for activity change, chills and fever.  HENT: Positive for congestion and rhinorrhea. Negative for ear pain.   Eyes: Negative for discharge.  Respiratory: Positive for cough. Negative for wheezing.   Cardiovascular: Negative for chest pain.  Gastrointestinal: Negative for nausea and vomiting.  Musculoskeletal: Negative for arthralgias.       Objective:   Physical Exam  Constitutional: He appears well-developed.  HENT:  Head: Normocephalic and atraumatic.  Mouth/Throat: Oropharynx is clear and moist. No oropharyngeal exudate.  Eyes: Right eye exhibits no discharge. Left eye exhibits no discharge.  Neck: Normal range of motion.  Cardiovascular: Normal rate, regular rhythm and normal heart sounds.  No murmur heard. Pulmonary/Chest: Effort normal and breath sounds normal. No respiratory distress. He has no wheezes. He has no rales.  Lymphadenopathy:    He has no cervical adenopathy.  Neurological: He exhibits normal muscle tone.  Skin: Skin is warm and dry.  Nursing note and vitals reviewed.         Assessment & Plan:  Patient was seen today for upper respiratory illness. It is felt that the patient is dealing with sinusitis. Antibiotics were prescribed today. Importance of compliance with medication was discussed. Symptoms should gradually resolve over the course of the next several days. If high fevers, progressive illness, difficulty breathing, worsening condition or failure for symptoms to improve over the next several days then the patient is to follow-up. If  any emergent conditions the patient is to follow-up in the emergency department otherwise to follow-up in the office. Augmentin prescribed Allergy medicine recommended Follow-up if progressive troubles

## 2017-11-06 ENCOUNTER — Ambulatory Visit: Payer: BLUE CROSS/BLUE SHIELD | Admitting: Family Medicine

## 2017-11-06 ENCOUNTER — Encounter: Payer: Self-pay | Admitting: Family Medicine

## 2017-11-06 VITALS — BP 170/98 | Temp 98.7°F | Ht 73.0 in | Wt 268.0 lb

## 2017-11-06 DIAGNOSIS — R601 Generalized edema: Secondary | ICD-10-CM

## 2017-11-06 DIAGNOSIS — M722 Plantar fascial fibromatosis: Secondary | ICD-10-CM | POA: Diagnosis not present

## 2017-11-06 DIAGNOSIS — M79662 Pain in left lower leg: Secondary | ICD-10-CM

## 2017-11-06 DIAGNOSIS — S8992XA Unspecified injury of left lower leg, initial encounter: Secondary | ICD-10-CM

## 2017-11-06 DIAGNOSIS — S86002A Unspecified injury of left Achilles tendon, initial encounter: Secondary | ICD-10-CM | POA: Diagnosis not present

## 2017-11-06 NOTE — Telephone Encounter (Signed)
Called pt and scheduled appointment for this afternoon.

## 2017-11-06 NOTE — Progress Notes (Signed)
   Subjective:    Patient ID: [name], male    DOB: 01-13-1956, 62 y.o.   MRN: [id number] Patient arrives with concerns, somewhat complicated HPI  Patient is here today with complaints of left leg pain.Had a fall on Oct 29, 2017. He states it is swollen again,proped it up.Aches some.  Approximately 8 days ago was pivoting and pushing off on one leg.  He felt a sudden hard popping sensation.  The back part of the calf towards the ankle.  Developed subsequent pain.  Over the next day developed subsequent swelling.  Is continued to limp with this.  After few days the swelling seemed to improve somewhat.  Now the last 2 days much worse.  Patient notes ongoing substantial pain.  Posterior calf and also posterior upper ankle region.  No history of injuring this regard.  Patient went back to work but had a lot of difficulty doing this.  Note substantially more swelling the last 24 hours.  But also was up on his feet the last 2 days with work           +  Review of Systems No headache, no major weight loss or weight gain, no chest pain no back pain abdominal pain no change in bowel habits complete ROS otherwise negative     Objective:   Physical Exam  Alert and oriented, vitals reviewed and stable, NAD ENT-TM's and ext canals WNL bilat via otoscopic exam Soft palate, tonsils and post pharynx WNL via oropharyngeal exam Neck-symmetric, no masses; thyroid nonpalpable and nontender Pulmonary-no tachypnea or accessory muscle use; Clear without wheezes via auscultation Card--no abnrml murmurs, rhythm reg and rate WNL Carotid pulses symmetric, without bruits Left ankle substantial weakness with plantar substantial edema. Flexion substantially weak.  Palpable abnormality in posterior Achilles tendon left     Assessment & Plan:  Impression 1 reemergence of edema after lower leg musculoskeletal injury.  Need to assess veins with ultrasound rationale discussed.  Injury  regarding substantial weakness and pain and swelling palpable defect in the Achilles tendon.  Need MRI to visualize extent of injury.  With plantar flexion still slightly present but diminished, may represent partial tear of the tendon.  Await MRI results.  Orthopedic referral

## 2017-11-07 ENCOUNTER — Telehealth: Payer: Self-pay

## 2017-11-07 ENCOUNTER — Telehealth: Payer: Self-pay | Admitting: Family Medicine

## 2017-11-07 ENCOUNTER — Ambulatory Visit (HOSPITAL_COMMUNITY)
Admission: RE | Admit: 2017-11-07 | Discharge: 2017-11-07 | Disposition: A | Discharge status: Home / Self Care | Payer: BLUE CROSS/BLUE SHIELD | Source: Ambulatory Visit | Attending: Family Medicine | Admitting: Family Medicine

## 2017-11-07 DIAGNOSIS — M722 Plantar fascial fibromatosis: Secondary | ICD-10-CM | POA: Insufficient documentation

## 2017-11-07 DIAGNOSIS — M7989 Other specified soft tissue disorders: Secondary | ICD-10-CM | POA: Diagnosis not present

## 2017-11-07 DIAGNOSIS — S86019A Strain of unspecified Achilles tendon, initial encounter: Secondary | ICD-10-CM

## 2017-11-07 NOTE — Telephone Encounter (Signed)
Need 11/06/17 OV note complete so that I may process MRI prior auth & referral to ortho

## 2017-11-07 NOTE — Telephone Encounter (Signed)
ok 

## 2017-11-07 NOTE — Telephone Encounter (Signed)
Note done btw referral is for probable achilles tndon rupture not plantar fascitis/be sure they ar e aware of that needs urgent refer

## 2017-11-07 NOTE — Telephone Encounter (Signed)
New referral with correct diagnosis put in

## 2017-11-07 NOTE — Telephone Encounter (Signed)
Per Izora Ribas she set the pt up for an appt with the ortho Dr. Maxie Better for tomorrow am 11/08/2017 at 9 am. He states he could not make that appt,but changed it to this Friday as he could not make due to work.

## 2017-11-10 DIAGNOSIS — S86012A Strain of left Achilles tendon, initial encounter: Secondary | ICD-10-CM | POA: Diagnosis not present

## 2017-12-08 DIAGNOSIS — S86012A Strain of left Achilles tendon, initial encounter: Secondary | ICD-10-CM | POA: Diagnosis not present

## 2017-12-31 ENCOUNTER — Other Ambulatory Visit: Payer: Self-pay | Admitting: Family Medicine

## 2018-01-05 DIAGNOSIS — S86012A Strain of left Achilles tendon, initial encounter: Secondary | ICD-10-CM | POA: Diagnosis not present

## 2018-01-25 ENCOUNTER — Other Ambulatory Visit: Payer: Self-pay

## 2018-01-25 ENCOUNTER — Encounter (HOSPITAL_COMMUNITY): Payer: Self-pay | Admitting: Physical Therapy

## 2018-01-25 ENCOUNTER — Ambulatory Visit (HOSPITAL_COMMUNITY): Payer: BLUE CROSS/BLUE SHIELD | Attending: Orthopedic Surgery | Admitting: Physical Therapy

## 2018-01-25 DIAGNOSIS — M25672 Stiffness of left ankle, not elsewhere classified: Secondary | ICD-10-CM | POA: Diagnosis not present

## 2018-01-25 NOTE — Therapy (Signed)
[facility] [place], [place], [address] Phone: [phone]   Fax:  [phone]  Physical Therapy Evaluation  Patient Details  Name: [name] MRN: [id number] Date of Birth: [birth year] Referring Provider: Victorino December    Encounter Date: 01/25/2018  PT End of Session - 01/25/18 1202    Visit Number  1    Number of Visits  4    Date for PT Re-Evaluation  02/24/18    Authorization Type  [employer]    Authorization - Visit Number  4    Authorization - Number of Visits  60    PT Start Time  1110    PT Stop Time  1200    PT Time Calculation (min)  50 min    Activity Tolerance  Patient tolerated treatment well    Behavior During Therapy  [facility] for tasks assessed/performed       Past Medical History:  Diagnosis Date  . ED (erectile dysfunction)   . Hyperlipidemia   . Hypertension     Past Surgical History:  Procedure Laterality Date  . APPENDECTOMY    . COLONOSCOPY      There were no vitals filed for this visit.   Subjective Assessment - 01/25/18 1056    Subjective  [name] states that on 10/29/2017 he was pushing off with his left foot when her heard a "pop".  He had immediate pain and swelling followed.  He went to his MD who referred him to an orthopedic MD, who placed him in a cam boot for about six weeks.  He was taken out of the boot for about three weeks now and is in an ankle brace.   He is continuing to have difficulty  bending  his foot therefore he has been referred to skilled physical therapy.    Pertinent History  HTN     Limitations  Lifting;Standing;Walking    How long can you sit comfortably?  no problem    How long can you stand comfortably?  No problem     How long can you walk comfortably?  stiffness but able to walk as he wants     Patient Stated Goals  less stiffness, less swelling     Currently in Pain?  No/denies         [facility] PT Assessment - 01/25/18 0001      Assessment   Medical  Diagnosis  Lt achilles tendon rupture     Referring Provider  Victorino December     Onset Date/Surgical Date  10/29/17    Next MD Visit  02/23/2018    Prior Therapy  none      Precautions   Precautions  Other (comment) hx of ruptured achilles tendon     Required Braces or Orthoses  Other Brace/Splint    Other Brace/Splint  ankle support       Restrictions   Weight Bearing Restrictions  No      Balance Screen   Has the patient fallen in the past 6 months  No    Has the patient had a decrease in activity level because of a fear of falling?   Yes    Is the patient reluctant to leave their home because of a fear of falling?   No      Home Film/video [employer] residence      Prior Function   Level of Independence  Independent    Vocation  Full time employment    Vocation Requirements  [employer]; on feet all day going up and down steps. Not having any problems with his work activities     Leisure  hard to do any with his work schedule       Cognition   Overall Cognitive Status  Within Functional Limits for tasks assessed      Observation/Other Assessments   Focus on Therapeutic Outcomes (FOTO)   59      Observation/Other Assessments-Edema    Edema  Circumferential Calf: largest RT  42 LT  43.8 ankle smallest RT: 27  LT:27.5      Functional Tests   Functional tests  Single leg stance      Single Leg Stance   Comments  Rt: 35; LT 8      ROM / Strength   AROM / PROM / Strength  AROM;Strength      AROM   AROM Assessment Site  Ankle    Right/Left Ankle  Right;Left    Right Ankle Dorsiflexion  14    Right Ankle Plantar Flexion  42    Right Ankle Inversion  20    Right Ankle Eversion  10    Left Ankle Dorsiflexion  14    Left Ankle Plantar Flexion  35    Left Ankle Inversion  12    Left Ankle Eversion  8      Strength   Overall Strength Comments  -- functional:  5/5 except plantarflexion 3-/5       Palpation   Palpation comment  increased  edema noted in posterior aspect of Lt LE       Ambulation/Gait   Ambulation Distance (Feet)  678 Feet    Assistive device  None    Gait Pattern  Decreased step length - left;Decreased dorsiflexion - left    Stairs  Yes    Stairs Assistance  6: Modified independent (Device/Increase time)    Stair Management Technique  No rails    Number of Stairs  4    Height of Stairs  7 Lt foot ER with decreased ankle motion    Gait Comments  3 minute decreased ankle DF and PLF                 Objective measurements completed on examination: See above findings.      [facility] Adult PT Treatment/Exercise - 01/25/18 0001      Exercises   Exercises  Ankle      Manual Therapy   Manual Therapy  Edema management    Manual therapy comments  done seperate from all other aspects of treatment     Edema Management  retro massage       Ankle Exercises: Standing   SLS  x3    Heel Raises  Both;5 reps             PT Education - 01/25/18 1201    Education Details  weaning of ankle brace, increasing DF with walking, HEP    Person(s) Educated  Patient    Methods  Explanation;Handout    Comprehension  Verbalized understanding;Returned demonstration       PT Short Term Goals - 01/25/18 1209      PT SHORT TERM GOAL #1   Title  PT to no longer be wearing his ankle brace at home     Time  2    Period  Weeks    Status  New    Target Date  02/08/18      PT SHORT TERM GOAL #2   Title  PT to be able to demonstrate a normalized gait pattern to decrease stress on knee and hip jts.     Time  2    Period  Weeks    Status  New      PT SHORT TERM GOAL #3   Title  PT to be able to single leg stance on his left foot for 15 seconds to reduce risk of fall     Time  2    Period  Weeks        PT Long Term Goals - 01/25/18 1211      PT LONG TERM GOAL #1   Title  Pt to be able to ascend and descend 10 steps with no hand assist with hip in neutral position.     Time  4    Period  Weeks     Status  New    Target Date  02/23/18      PT LONG TERM GOAL #2   Title  PT to Lt ankle flexibility and strength to be at least 4+/5 to allow pt to go into a squatted postion and arise to pick items off of the floor.     Time  4    Period  Weeks    Status  New             Plan - 01/25/18 1203    Clinical Impression Statement  [name] is a 62 yo male who has been referred to skilled physical therapy following a left achilles tear rupture.  He has been taken out of a boot that he was in for six weeks and now is wearing an ankle brace.  Examination demonstrates decreased ROM, decreased strength, decreased balance and increased swelling. [name] will benefit from skilled physical therapy to address these issues and normalize his gaite.     Clinical Presentation  Stable    Clinical Decision Making  Low    Rehab Potential  Good    PT Frequency  1x / week    PT Duration  4 weeks    PT Treatment/Interventions  ADLs/Self Care Home Management;Manual techniques;Therapeutic activities;Therapeutic exercise;Stair training;Gait training;Functional mobility training    PT Next Visit Plan  Begin heel toe gait training, normalize ascending/descending steps.  Add toe raises and see if pt is able to descend on Lt leg only when doing heel raises.  Add to HEP as pt will only be coming once a week.  Continue with manual   PT Home Exercise Plan  heel raises; SLS    Consulted and Agree with Plan of Care  Patient       Patient will benefit from skilled therapeutic intervention in order to improve the following deficits and impairments:  Abnormal gait, Increased edema, Decreased strength, Decreased range of motion  Visit Diagnosis: Stiffness of left ankle, not elsewhere classified - Plan: PT plan of care cert/re-cert     Problem List Patient Active Problem List   Diagnosis Date Noted  . Essential hypertension, benign 07/12/2013  . Other and unspecified hyperlipidemia 07/12/2013  .  Plantar fasciitis of left foot 07/12/2013    Marylee Belzer,CINDY 01/25/2018, 12:16 PM  [place], [place], [address] Phone: [phone]   Fax:  [phone]  Name: [name] MRN: [id number] Date of Birth: 10-31-1955

## 2018-01-25 NOTE — Patient Instructions (Addendum)
Balance: Unilateral    Attempt to balance on left leg, eyes open. Hold __10-40__ seconds. Repeat _3-5__ times per set. Do _1___ sets per session. Do _2___ sessions per day. Perform exercise with eyes closed.  http://orth.exer.us/28   Copyright  VHI. All rights reserved.  Heel Raise: Bilateral (Standing)    Rise on balls of feet. Repeat _10___ times per set. Do __1__ sets per session. Do __3__ sessions per day.  http://orth.exer.us/38   Copyright  VHI. All rights reserved.

## 2018-01-30 ENCOUNTER — Telehealth: Payer: Self-pay | Admitting: Family Medicine

## 2018-01-30 ENCOUNTER — Ambulatory Visit: Payer: BLUE CROSS/BLUE SHIELD | Admitting: Family Medicine

## 2018-01-30 ENCOUNTER — Encounter: Payer: Self-pay | Admitting: Family Medicine

## 2018-01-30 VITALS — BP 170/104 | Ht 73.0 in | Wt 265.0 lb

## 2018-01-30 DIAGNOSIS — E7800 Pure hypercholesterolemia, unspecified: Secondary | ICD-10-CM | POA: Diagnosis not present

## 2018-01-30 DIAGNOSIS — S86019A Strain of unspecified Achilles tendon, initial encounter: Secondary | ICD-10-CM

## 2018-01-30 DIAGNOSIS — M79672 Pain in left foot: Secondary | ICD-10-CM | POA: Diagnosis not present

## 2018-01-30 DIAGNOSIS — I10 Essential (primary) hypertension: Secondary | ICD-10-CM

## 2018-01-30 MED ORDER — PRAVASTATIN SODIUM 20 MG PO TABS: ORAL_TABLET | ORAL | 5 refills | Status: DC

## 2018-01-30 MED ORDER — HYDROCHLOROTHIAZIDE 25 MG PO TABS: ORAL_TABLET | ORAL | 1 refills | Status: DC

## 2018-01-30 MED ORDER — LISINOPRIL 40 MG PO TABS: 40.0000 mg | ORAL_TABLET | Freq: Every day | ORAL | 5 refills | Status: DC

## 2018-01-30 NOTE — Progress Notes (Signed)
   Subjective:    Patient ID: [name], male    DOB: February 20, 1956, 62 y.o.   MRN: [id number]  HPI  Patient is here today to follow up on his chronic health issues.He tries to eat healthy and exercises,but not as he should.  Blood pressure medicine and blood pressure levels reviewed today with patient. Compliant with blood pressure medicine. States does not miss a dose. No obvious side effects. Blood pressure generally good when checked elsewhere. Watching salt intake.   Patient continues to take lipid medication regularly. No obvious side effects from it. Generally does not miss a dose. Prior blood work results are reviewed with patient. Patient continues to work on fat intake in diet  Has sen ortho doc of left achilles tendon, not needing surgery  Taking meds faithfully   States die overall pretty good, watching diet , eating well      Review of Systems No headache, no major weight loss or weight gain, no chest pain no back pain abdominal pain no change in bowel habits complete ROS otherwise negative     Objective:   Physical Exam  Alert and oriented, vitals reviewed and stable, NAD ENT-TM's and ext canals WNL bilat via otoscopic exam Soft palate, tonsils and post pharynx WNL via oropharyngeal exam Neck-symmetric, no masses; thyroid nonpalpable and nontender Pulmonary-no tachypnea or accessory muscle use; Clear without wheezes via auscultation Card--no abnrml murmurs, rhythm reg and rate WNL Carotid pulses symmetric, without bruits       Assessment & Plan:  Impression hypertension.  Suboptimal control.  Discussed.  Strong family history of stroke.  Blood pressures been too high the past year  2.  Hyperlipidemia.  Prior blood work reviewed.  Compliance discussed importance of sticking with meds discussed  3.  Status post Achilles tendon rupture.  Discussed with patient.  Ongoing pain and swelling.  Multiple questions answered regarding physical therapy and  nonsurgical intervention  Importance of blood yearly blood work encouraged.  Add hydrochlorothiazide 25 daily.  Maintain other medications.  Diet exercise discussed.  Follow-up in 6 months.  [facility]

## 2018-01-30 NOTE — Telephone Encounter (Signed)
Error

## 2018-02-02 ENCOUNTER — Encounter (HOSPITAL_COMMUNITY): Payer: Self-pay

## 2018-02-02 ENCOUNTER — Ambulatory Visit (HOSPITAL_COMMUNITY): Payer: BLUE CROSS/BLUE SHIELD

## 2018-02-02 DIAGNOSIS — M25672 Stiffness of left ankle, not elsewhere classified: Secondary | ICD-10-CM

## 2018-02-02 NOTE — Therapy (Signed)
[place], [place], [address] Phone: [phone]   Fax:  [phone]  Physical Therapy Treatment  Patient Details  Name: [name] MRN: [id number] Date of Birth: Oct 23, 1955 Referring Provider: Victorino December    Encounter Date: 02/02/2018  PT End of Session - 02/02/18 1311    Visit Number  2    Number of Visits  4    Date for PT Re-Evaluation  02/24/18    Authorization Type  BCBS    Authorization - Visit Number  5    Authorization - Number of Visits  60    PT Start Time  1301    PT Stop Time  1343    PT Time Calculation (min)  42 min    Activity Tolerance  Patient tolerated treatment well    Behavior During Therapy  [facility] for tasks assessed/performed       Past Medical History:  Diagnosis Date  . ED (erectile dysfunction)   . Hyperlipidemia   . Hypertension     Past Surgical History:  Procedure Laterality Date  . APPENDECTOMY    . COLONOSCOPY      There were no vitals filed for this visit.  Subjective Assessment - 02/02/18 1306    Subjective  Pt stated he has began heel raises and felt a painful pop, increased soreness following.  No reports of current pain.      Patient Stated Goals  less stiffness, less swelling     Currently in Pain?  No/denies                       [facility] Adult PT Treatment/Exercise - 02/02/18 0001      Manual Therapy   Manual Therapy  Edema management    Manual therapy comments  done seperate from all other aspects of treatment     Edema Management  retro massage       Ankle Exercises: Standing   SLS  Rt 36", Lt 8" max of 3    Rocker Board  2 minutes;Limitations   lateral   Heel Raises  Both;10 reps   2 sets   Toe Raise  15 reps;3 seconds   1 HHA to improve mechanics   Other Standing Ankle Exercises  heel to toe and equal stance phase gait training x 270ft      Ankle Exercises: Stretches   Slant Board Stretch  2 reps;30 seconds              PT Education - 02/02/18 1318    Education Details  reviewed goals, assured compliance iwth HEP and copy of eval given to pt.  Educated on weaning of ankle brace    Person(s) Educated  Patient    Methods  Explanation;Demonstration;Handout    Comprehension  Returned demonstration;Verbalized understanding       PT Short Term Goals - 01/25/18 1209      PT SHORT TERM GOAL #1   Title  PT to no longer be wearing his ankle brace at home     Time  2    Period  Weeks    Status  New    Target Date  02/08/18      PT SHORT TERM GOAL #2   Title  PT to be able to demonstrate a normalized gait pattern to decrease stress on knee and hip jts.     Time  2    Period  Weeks  Status  New      PT SHORT TERM GOAL #3   Title  PT to be able to single leg stance on his left foot for 15 seconds to reduce risk of fall     Time  2    Period  Weeks        PT Long Term Goals - 01/25/18 1211      PT LONG TERM GOAL #1   Title  Pt to be able to ascend and descend 10 steps with no hand assist with hip in neutral position.     Time  4    Period  Weeks    Status  New    Target Date  02/22/18      PT LONG TERM GOAL #2   Title  PT to Lt ankle flexibility and strength to be at least 4+/5 to allow pt to go into a squatted postion and arise to pick items off of the floor.     Time  4    Period  Weeks    Status  New            Plan - 02/02/18 1343    Clinical Impression Statement  Reviewed goals, assured compliance wiht HEP and copy of eval given to pt.  Reviewed form/mechanics with HEP, pt able to complete all heel raises with no report of "popping" through session, pt fearful to complete Bil up and Lt only descending this session, progress next session.  Session complete without brace on ankle, discussed wearing of brace.  Gait training with cueing for heel to toe mechanics and equal stance phase.  Added toe raises to HEP, able to demonstrate good mechanics following initial  instruction.  Pt continues to have difficulty with SLS activities.  EOS with manual to address edema present proximal ankle.  No reports of pain through session.      Rehab Potential  Good    PT Frequency  1x / week    PT Duration  4 weeks    PT Treatment/Interventions  ADLs/Self Care Home Management;Manual techniques;Therapeutic activities;Therapeutic exercise;Stair training;Gait training;Functional mobility training    PT Next Visit Plan  Next session progress to Bil heel raise and descend LT leg only.  Add side step and vector stance to improve balance.  Continue gait training and manual to improve mechanics and reduce edema.      PT Home Exercise Plan  heel raises; SLS; toe raises       Patient will benefit from skilled therapeutic intervention in order to improve the following deficits and impairments:  Abnormal gait, Increased edema, Decreased strength, Decreased range of motion  Visit Diagnosis: Stiffness of left ankle, not elsewhere classified     Problem List Patient Active Problem List   Diagnosis Date Noted  . Essential hypertension, benign 07/12/2013  . Other and unspecified hyperlipidemia 07/12/2013  . Plantar fasciitis of left foot 07/12/2013   Ihor Austin, LPTA; CBIS [phone]  Aldona Lento 02/02/2018, 1:50 PM  [facility] [address] [place], [place], [address] Phone: [phone]   Fax:  [phone]  Name: [name] MRN: [id number] Date of Birth: [birth year]

## 2018-02-02 NOTE — Patient Instructions (Signed)
Toe / Heel Raise (Standing)    Standing with support, raise heels, then rock back on heels and raise toes. Repeat 15 times.  Copyright  VHI. All rights reserved.

## 2018-02-07 ENCOUNTER — Encounter (HOSPITAL_COMMUNITY): Payer: Self-pay

## 2018-02-07 ENCOUNTER — Ambulatory Visit (HOSPITAL_COMMUNITY): Payer: BLUE CROSS/BLUE SHIELD

## 2018-02-07 DIAGNOSIS — M25672 Stiffness of left ankle, not elsewhere classified: Secondary | ICD-10-CM | POA: Diagnosis not present

## 2018-02-07 NOTE — Therapy (Addendum)
[place], [place], [address] Phone: [phone]   Fax:  [phone]  Physical Therapy Treatment  Patient Details  Name: [name] MRN: [id number] Date of Birth: 1956-02-10 Referring Provider: Victorino December    Encounter Date: 02/07/2018  PT End of Session - 02/07/18 1258    Visit Number  3    Number of Visits  4    Date for PT Re-Evaluation  02/24/18    Authorization Type  BCBS    Authorization - Visit Number  6    Authorization - Number of Visits  60    PT Start Time  6195    PT Stop Time  1346    PT Time Calculation (min)  43 min    Activity Tolerance  Patient tolerated treatment well    Behavior During Therapy  [facility] for tasks assessed/performed       Past Medical History:  Diagnosis Date  . ED (erectile dysfunction)   . Hyperlipidemia   . Hypertension     Past Surgical History:  Procedure Laterality Date  . APPENDECTOMY    . COLONOSCOPY      There were no vitals filed for this visit.  Subjective Assessment - 02/07/18 1258    Subjective  Not wearing brace for 2-3 hours per 12 hour work day.  Typically doesn't wear brace at night until the next morning when going to work. No reports of current pain.      Patient Stated Goals  less stiffness, less swelling     Currently in Pain?  No/denies   some stiffness                      [facility] Adult PT Treatment/Exercise - 02/07/18 0001      Ambulation/Gait   Stair Management Technique  No rails;Alternating pattern;Forwards    Number of Stairs  4   3 RT   Height of Stairs  7      Balance   Balance Assessed  Yes      Manual Therapy   Manual Therapy  Edema management    Manual therapy comments  done seperate from all other aspects of treatment     Edema Management  retro massage       Ankle Exercises: Standing   SLS  x3, max 10 sec    Rocker Board  2 minutes;Limitations   lateral, AP   Heel Raises  Both;10 reps   2 sets;  2nd set B LE up w/ Lt LE descending   Toe Raise  15 reps;3 seconds   1 HHA to improve mechanics   Other Standing Ankle Exercises  heel to toe phase of gait mechanics      Ankle Exercises: Stretches   Slant Board Stretch  2 reps;30 seconds          Balance Exercises - 02/07/18 1323      Balance Exercises: Standing   Tandem Stance  Eyes open;Intermittent upper extremity support;2 reps;30 secs        PT Education - 02/07/18 1316    Education Details  reviewed HEP, clniical reasoning for exercises, educated on proper gait cycle and exercise performance    Person(s) Educated  Patient    Methods  Explanation;Demonstration;Verbal cues    Comprehension  Verbalized understanding;Returned demonstration;Verbal cues required;Need further instruction       PT Short Term Goals - 02/07/18 1300      PT SHORT TERM  GOAL #1   Title  PT to no longer be wearing his ankle brace at home     Time  2    Period  Weeks    Status  Achieved      PT SHORT TERM GOAL #2   Title  PT to be able to demonstrate a normalized gait pattern to decrease stress on knee and hip jts.     Time  2    Period  Weeks    Status  On-going      PT SHORT TERM GOAL #3   Title  PT to be able to single leg stance on his left foot for 15 seconds to reduce risk of fall     Time  2    Period  Weeks    Status  On-going        PT Long Term Goals - 02/07/18 1301      PT LONG TERM GOAL #1   Title  Pt to be able to ascend and descend 10 steps with no hand assist with hip in neutral position.     Time  4    Period  Weeks    Status  On-going      PT LONG TERM GOAL #2   Title  PT to Lt ankle flexibility and strength to be at least 4+/5 to allow pt to go into a squatted postion and arise to pick items off of the floor.     Time  4    Period  Weeks    Status  On-going            Plan - 02/07/18 1300    Clinical Impression Statement  Session focused on functional strengthening, balance and gait training.  Retrograde massage performed during manual therapy portion. Patient may benefit from knee high compression stockings to help control swelling during work day on his feet. Reviewed form/mechanics of therex. No reports of '"popping" through session. Performed bilateral heel raise with Lt LE eccentrically without c/o of pain or "popping" but did exhibit weakness. Session completed without brace on ankle. Gait training for cueing heel to toe mechanics and equal stride length. Continue with current plan, progressing as able. Re-assess next visit to determine continuance or DC from PT.     Rehab Potential  Good    PT Frequency  1x / week    PT Duration  4 weeks    PT Treatment/Interventions  ADLs/Self Care Home Management;Manual techniques;Therapeutic activities;Therapeutic exercise;Stair training;Gait training;Functional mobility training    PT Next Visit Plan  Next session progress to Bil heel raise and descend LT leg only.  Add side step and vector stance to improve balance.  Continue gait training and manual to improve mechanics and reduce edema.  Re-assess next visit to determine continuance or DC from PT.  Patient may benefit from knee high compression stockings to help control swelling during work day on his feet.    PT Home Exercise Plan  heel raises; SLS; toe raises       Patient will benefit from skilled therapeutic intervention in order to improve the following deficits and impairments:  Abnormal gait, Increased edema, Decreased strength, Decreased range of motion  Visit Diagnosis: Stiffness of left ankle, not elsewhere classified     Problem List Patient Active Problem List   Diagnosis Date Noted  . Essential hypertension, benign 07/12/2013  . Other and unspecified hyperlipidemia 07/12/2013  . Plantar fasciitis of left foot 07/12/2013    Floria Raveling.  Hartnett-Rands, MS, PT [facility] [place] #[id number] 02/07/2018, 3:49 PM  [facility] [address] [place], [place], [address] Phone: [phone]   Fax:  [phone]  Name: [name] MRN: [id number] Date of Birth: 08/14/1955

## 2018-02-08 ENCOUNTER — Encounter (HOSPITAL_COMMUNITY): Payer: BLUE CROSS/BLUE SHIELD

## 2018-02-12 ENCOUNTER — Ambulatory Visit (HOSPITAL_COMMUNITY): Payer: BLUE CROSS/BLUE SHIELD | Admitting: Physical Therapy

## 2018-02-12 ENCOUNTER — Encounter (HOSPITAL_COMMUNITY): Payer: Self-pay | Admitting: Physical Therapy

## 2018-02-12 DIAGNOSIS — M25672 Stiffness of left ankle, not elsewhere classified: Secondary | ICD-10-CM | POA: Diagnosis not present

## 2018-02-12 NOTE — Patient Instructions (Addendum)
Dorsiflexion: Resisted    Facing anchor, tubing around left foot, pull toward face.  Repeat _15___ times per set. Do __1__ sets per session. Do _1___ sessions per day.  http://orth.exer.us/8   Copyright  VHI. All rights reserved.  Eversion: Resisted    With right foot in tubing loop, hold tubing around other foot to resist and turn foot out. Repeat _15___ times per set. Do __1__ sets per session. Do ___1_ sessions per day.  http://orth.exer.us/14   Copyright  VHI. All rights reserved.  Inversion: Resisted    Cross legs with right leg underneath, foot in tubing loop. Hold tubing around other foot to resist and turn foot in. Repeat _15___ times per set. Do __1__ sets per session. Do ___1_ sessions per day.  http://orth.exer.us/12   Copyright  VHI. All rights reserved.  Plantar Flexion: Resisted    Anchor behind, tubing around left foot, press down. Repeat _15___ times per set. Do ___1_ sets per session. Do __1__ sessions per day.  http://orth.exer.us/10   Copyright  VHI. All rights reserved.  PRE: Plantar Flexion - Knee Flexed (Prone)    Lying on stomach with right knee bent and __5__ pound weight around foot, point foot toward ceiling. Repeat _10___ times per set. Do _1___ sets per session. Do _2___ sessions per day.  http://orth.exer.us/54   Copyright  VHI. All rights reserved.

## 2018-02-12 NOTE — Therapy (Signed)
[facility] [address] [place], [place], [address] Phone: [phone]   Fax:  [phone]  Physical Therapy Treatment  Patient Details  Name: [name] MRN: [id number] Date of Birth: [birth year] Referring Provider: Victorino December    Encounter Date: 02/12/2018  PHYSICAL THERAPY DISCHARGE SUMMARY  Visits from Start of Care: 4 Current functional level related to goals / functional outcomes: See below Remaining deficits: See below   Education / Equipment: HEP Plan: Patient agrees to discharge.  Patient goals were not met. Patient is being discharged due to meeting the stated rehab goals.  ?????      PT End of Session - 02/12/18 1516    Visit Number  4    Number of Visits  4    Date for PT Re-Evaluation  02/24/18    Authorization Type  BCBS    Authorization - Visit Number  7    Authorization - Number of Visits  60    PT Start Time  3976    PT Stop Time  1516    PT Time Calculation (min)  38 min    Activity Tolerance  Patient tolerated treatment well    Behavior During Therapy  WFL for tasks assessed/performed       Past Medical History:  Diagnosis Date  . ED (erectile dysfunction)   . Hyperlipidemia   . Hypertension     Past Surgical History:  Procedure Laterality Date  . APPENDECTOMY    . COLONOSCOPY      There were no vitals filed for this visit.  Subjective Assessment - 02/12/18 1437    Subjective  Pt states that he has no pain with his achilles.  He is more comfortable walking without his brace.      Pertinent History  HTN     Limitations  Lifting;Standing;Walking    How long can you sit comfortably?  no problem    How long can you stand comfortably?  No problem     How long can you walk comfortably?  stiffness is a lot less when he walk now     Patient Stated Goals  less stiffness, less swelling     Currently in Pain?  No/denies         [facility] PT Assessment - 02/12/18 0001      Assessment   Medical Diagnosis  Lt achilles tendon rupture     Referring Provider  Victorino December     Onset Date/Surgical Date  10/29/17    Next MD Visit  02/23/2018    Prior Therapy  none      Precautions   Precautions  Other (comment)   hx of ruptured achilles tendon    Required Braces or Orthoses  Other Brace/Splint    Other Brace/Splint  ankle support       Restrictions   Weight Bearing Restrictions  No      [place] residence      Prior Function   Level of Independence  Independent    Vocation  Full time employment    Vocation Requirements  [employer]; on feet all day going up and down steps. Not having any problems with his work activities     Leisure  hard to do any with his work schedule       Cognition   Overall Cognitive Status  Within Functional Limits for tasks assessed      Observation/Other Assessments   Focus  on Therapeutic Outcomes (FOTO)   30   was 14     Observation/Other Assessments-Edema    Edema  Circumferential   Calf: largest RT  42 LT  42.8 ankle smallest RT: 27  LT:27.5     Functional Tests   Functional tests  Single leg stance      Single Leg Stance   Comments  Rt: 48; LT 12   RT was 35; LT was 8      AROM   Right Ankle Dorsiflexion  14    Right Ankle Plantar Flexion  42    Right Ankle Inversion  20    Right Ankle Eversion  10    Left Ankle Dorsiflexion  14    Left Ankle Plantar Flexion  40   was 35   Left Ankle Inversion  15   was 12    Left Ankle Eversion  8      Strength   Overall Strength Comments  --   functional:  5/5 except plantarflexion 3-/5 remains 3-/5      Palpation   Palpation comment  increased edema noted in posterior aspect of Lt LE       Ambulation/Gait   Ambulation Distance (Feet)  678 Feet    Assistive device  None    Gait Pattern  Decreased step length - left;Decreased dorsiflexion - left    Stairs  Yes    Stairs Assistance  6: Modified independent (Device/Increase time)     Stair Management Technique  No rails    Number of Stairs  4    Height of Stairs  7   Lt foot now in neutral was ER    Gait Comments  3 minute decreased ankle DF and PLF                    [facility] Adult PT Treatment/Exercise - 02/12/18 0001      Exercises   Exercises  Ankle      Ankle Exercises: Standing   SLS  3x B     Heel Raises  15 reps      Ankle Exercises: Supine   T-Band  All motions Blue t band x 15 LT LE       Ankle Exercises: Sidelying   Other Sidelying Ankle Exercises  Prone ankle plantarflexion/dorsiflexion with 5# x 10               PT Short Term Goals - 02/12/18 1456      PT SHORT TERM GOAL #1   Title  PT to no longer be wearing his ankle brace at home     Time  2    Period  Weeks    Status  Achieved      PT SHORT TERM GOAL #2   Title  PT to be able to demonstrate a normalized gait pattern to decrease stress on knee and hip jts.     Time  2    Period  Weeks    Status  On-going   Has heel toe gait until he fatigues.      PT SHORT TERM GOAL #3   Title  PT to be able to single leg stance on his left foot for 15 seconds to reduce risk of fall     Time  2    Period  Weeks    Status  On-going        PT Long Term Goals - 02/12/18 1457  PT LONG TERM GOAL #1   Title  Pt to be able to ascend and descend 10 steps with no hand assist with hip in neutral position.     Time  4    Period  Weeks    Status  Achieved      PT LONG TERM GOAL #2   Title  PT to Lt ankle flexibility and strength to be at least 4+/5 to allow pt to go into a squatted postion and arise to pick items off of the floor.     Time  4    Period  Weeks    Status  Not Met            Plan - 02/12/18 1516    Clinical Impression Statement  Pt reassessed.  Pt has improved in gait mechanics, and proprioception but continues to have deficits in these arreas and has not improved in plantarflexion strength at all.  Pt able to complete all functional tasks that he wants  to do and requests to be discharge at this time.      Rehab Potential  Good    PT Frequency  1x / week    PT Duration  4 weeks    PT Treatment/Interventions  ADLs/Self Care Home Management;Manual techniques;Therapeutic activities;Therapeutic exercise;Stair training;Gait training;Functional mobility training    PT Next Visit Plan  Discharge     PT Home Exercise Plan  heel raises; SLS; toe raises    Consulted and Agree with Plan of Care  Patient       Patient will benefit from skilled therapeutic intervention in order to improve the following deficits and impairments:  Abnormal gait, Increased edema, Decreased strength, Decreased range of motion  Visit Diagnosis: Stiffness of left ankle, not elsewhere classified     Problem List Patient Active Problem List   Diagnosis Date Noted  . Essential hypertension, benign 07/12/2013  . Other and unspecified hyperlipidemia 07/12/2013  . Plantar fasciitis of left foot 07/12/2013    Rayetta Humphrey, PT CLT [phone] 02/12/2018, 3:18 PM  [facility] [place], [place], [address] Phone: [phone]   Fax:  [phone]  Name: [name] MRN: [id number] Date of Birth: Nov 13, 1955

## 2018-02-21 ENCOUNTER — Ambulatory Visit (HOSPITAL_COMMUNITY): Payer: BLUE CROSS/BLUE SHIELD | Admitting: Physical Therapy

## 2018-03-02 DIAGNOSIS — S86012A Strain of left Achilles tendon, initial encounter: Secondary | ICD-10-CM | POA: Diagnosis not present

## 2018-07-30 ENCOUNTER — Other Ambulatory Visit: Payer: Self-pay | Admitting: Family Medicine

## 2018-08-26 ENCOUNTER — Other Ambulatory Visit: Payer: Self-pay | Admitting: Family Medicine

## 2018-08-31 ENCOUNTER — Ambulatory Visit: Payer: BLUE CROSS/BLUE SHIELD | Admitting: Family Medicine

## 2018-08-31 ENCOUNTER — Encounter: Payer: Self-pay | Admitting: Family Medicine

## 2018-08-31 VITALS — BP 132/80 | Temp 98.6°F | Ht 74.0 in | Wt 260.0 lb

## 2018-08-31 DIAGNOSIS — J019 Acute sinusitis, unspecified: Secondary | ICD-10-CM

## 2018-08-31 MED ORDER — AMOXICILLIN-POT CLAVULANATE 875-125 MG PO TABS: 1.0000 | ORAL_TABLET | Freq: Two times a day (BID) | ORAL | 0 refills | Status: DC

## 2018-08-31 NOTE — Progress Notes (Signed)
   Subjective:    Patient ID: [name], male    DOB: Jan 27, 1956, 63 y.o.   MRN: [id number]  HPI  Patient is here today with complaints of a cough,runny nose,sinus drainage.On going for the  Last few weeks. Significant sinus congestion drainage coughing no wheezing difficulty breathing symptoms been going on for several weeks no sweats chills or vomiting PMH benign Over the counter cold medication.  Review of Systems  Constitutional: Negative for activity change, chills and fever.  HENT: Positive for congestion and rhinorrhea. Negative for ear pain.   Eyes: Negative for discharge.  Respiratory: Positive for cough. Negative for wheezing.   Cardiovascular: Negative for chest pain.  Gastrointestinal: Negative for nausea and vomiting.  Musculoskeletal: Negative for arthralgias.       Objective:   Physical Exam Vitals signs and nursing note reviewed.  Constitutional:      Appearance: He is well-developed.  HENT:     Head: Normocephalic.     Mouth/Throat:     Pharynx: No oropharyngeal exudate.  Neck:     Musculoskeletal: Normal range of motion.  Cardiovascular:     Rate and Rhythm: Normal rate and regular rhythm.     Heart sounds: Normal heart sounds. No murmur.  Pulmonary:     Effort: Pulmonary effort is normal.     Breath sounds: Normal breath sounds. No wheezing.  Lymphadenopathy:     Cervical: No cervical adenopathy.  Skin:    General: Skin is warm and dry.  Neurological:     Motor: No abnormal muscle tone.     15 minutes was spent with patient today discussing healthcare issues which they came.  More than 50% of this visit-total duration of visit-was spent in counseling and coordination of care.  Please see diagnosis regarding the focus of this coordination and care       Assessment & Plan:  Acute rhinosinusitis antibiotics prescribed warning signs discussed follow-up if progressive troubles or worse

## 2018-09-05 ENCOUNTER — Encounter: Payer: Self-pay | Admitting: Family Medicine

## 2018-09-05 ENCOUNTER — Other Ambulatory Visit: Payer: Self-pay

## 2018-09-05 ENCOUNTER — Ambulatory Visit: Payer: BLUE CROSS/BLUE SHIELD | Admitting: Family Medicine

## 2018-09-05 VITALS — BP 134/82 | Ht 74.0 in | Wt 261.0 lb

## 2018-09-05 DIAGNOSIS — I10 Essential (primary) hypertension: Secondary | ICD-10-CM | POA: Diagnosis not present

## 2018-09-05 DIAGNOSIS — J019 Acute sinusitis, unspecified: Secondary | ICD-10-CM | POA: Diagnosis not present

## 2018-09-05 DIAGNOSIS — E7800 Pure hypercholesterolemia, unspecified: Secondary | ICD-10-CM

## 2018-09-05 MED ORDER — HYDROCHLOROTHIAZIDE 25 MG PO TABS: ORAL_TABLET | ORAL | 5 refills | Status: DC

## 2018-09-05 MED ORDER — LISINOPRIL 40 MG PO TABS: 40.0000 mg | ORAL_TABLET | Freq: Every day | ORAL | 5 refills | Status: DC

## 2018-09-05 MED ORDER — PRAVASTATIN SODIUM 20 MG PO TABS: ORAL_TABLET | ORAL | 5 refills | Status: DC

## 2018-09-05 NOTE — Progress Notes (Signed)
   Subjective:    Patient ID: [name], male    DOB: [birth year], 63 y.o.   MRN: [id number]  HPI  Patient is here today to follow up on his chronic health issues.  Hypertension: Hctz 25 mg once per day, Lisinopril 40 mg once per day  Unspecified Hyperlipidemia:Pravastatin 20 mg once per day.  He eats healthy and does some exercises.  He does not see any specialist.  He has brought in with him today his blood work. Was seen last week by Dr. Nicki Reaper and was given an antibiotic for sinus infection. Still has some drainage at night.  Patient continues to take lipid medication regularly. No obvious side effects from it. Generally does not miss a dose. Prior blood work results are reviewed with patient. Patient continues to work on fat intake in diet  Blood pressure medicine and blood pressure levels reviewed today with patient. Compliant with blood pressure medicine. States does not miss a dose. No obvious side effects. Blood pressure generally good when checked elsewhere. Watching salt intake.    Review of Systems No headache, no major weight loss or weight gain, no chest pain no back pain abdominal pain no change in bowel habits complete ROS otherwise negative     Objective:   Physical Exam  Alert and oriented, vitals reviewed and stable, NAD ENT-TM's and ext canals WNL bilat via otoscopic exam Soft palate, tonsils and post pharynx WNL via oropharyngeal exam Neck-symmetric, no masses; thyroid nonpalpable and nontender Pulmonary-no tachypnea or accessory muscle use; Clear without wheezes via auscultation Card--no abnrml murmurs, rhythm reg and rate WNL Carotid pulses symmetric, without bruits  Results for orders placed or performed in visit on 11/07/16  [facility] COLONOSCOPY  Result Value Ref Range   [facility] Colonoscopy Patient Reported See Report (in chart), Patient Reported         Assessment & Plan:  Impression hypertension.  Control good discussed maintain same meds  2.   Hyperlipidemia.  Prior blood work reviewed to maintain same pending blood work  3.  Residual rhinosinusitis.  Discussed patient to maintain same  4.  Screening blood work.  Results reviewed.  Follow-up in 6 months for wellness plus chronic

## 2018-12-04 ENCOUNTER — Encounter: Payer: Self-pay | Admitting: Internal Medicine

## 2019-03-11 ENCOUNTER — Encounter: Payer: Self-pay | Admitting: Family Medicine

## 2019-03-11 ENCOUNTER — Other Ambulatory Visit: Payer: Self-pay

## 2019-03-11 ENCOUNTER — Ambulatory Visit (INDEPENDENT_AMBULATORY_CARE_PROVIDER_SITE_OTHER): Payer: BC Managed Care – PPO | Admitting: Family Medicine

## 2019-03-11 VITALS — BP 134/86 | Temp 98.3°F | Ht 74.0 in | Wt 262.8 lb

## 2019-03-11 DIAGNOSIS — Z23 Encounter for immunization: Secondary | ICD-10-CM | POA: Diagnosis not present

## 2019-03-11 DIAGNOSIS — I10 Essential (primary) hypertension: Secondary | ICD-10-CM | POA: Diagnosis not present

## 2019-03-11 DIAGNOSIS — Z0000 Encounter for general adult medical examination without abnormal findings: Secondary | ICD-10-CM | POA: Diagnosis not present

## 2019-03-11 DIAGNOSIS — E7800 Pure hypercholesterolemia, unspecified: Secondary | ICD-10-CM

## 2019-03-11 DIAGNOSIS — Z125 Encounter for screening for malignant neoplasm of prostate: Secondary | ICD-10-CM

## 2019-03-11 DIAGNOSIS — Z79899 Other long term (current) drug therapy: Secondary | ICD-10-CM

## 2019-03-11 MED ORDER — PRAVASTATIN SODIUM 20 MG PO TABS: ORAL_TABLET | ORAL | 5 refills | Status: AC

## 2019-03-11 MED ORDER — LISINOPRIL 40 MG PO TABS: 40.0000 mg | ORAL_TABLET | Freq: Every day | ORAL | 5 refills | Status: AC

## 2019-03-11 MED ORDER — HYDROCHLOROTHIAZIDE 25 MG PO TABS: ORAL_TABLET | ORAL | 5 refills | Status: AC

## 2019-03-11 NOTE — Progress Notes (Signed)
Subjective:    Patient ID: [name], male    DOB: [birth year], 63 y.o.   MRN: [id number]  HPI The patient comes in today for a wellness visit.    A review of their health history was completed.  A review of medications was also completed.  Any needed refills; pt does not need refills at this time  Eating habits: tries to be healthy, sometimes 'falls off the wagon"  Falls/  MVA accidents in past few months: none  Regular exercise: does lots of walking at work   Specialist pt sees on regular basis: none  Preventative health issues were discussed.   Additional concerns: right elbow pain; mostly at night. 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 Pt states he has not been able to check BP daily. No problems with BP.    Blood pressure medicine and blood pressure levels reviewed today with patient. Compliant with blood pressure medicine. States does not miss a dose. No obvious side effects. Blood pressure generally good when checked elsewhere. Watching salt intake.    Patient continues to take lipid medication regularly. No obvious side effects from it. Generally does not miss a dose. Prior blood work results are reviewed with patient. Patient continues to work on fat intake in diet  Elbow pain substantial. Right handed. Deep ache in the elbow at times. No sig overime        Stays very active with work       Review of Systems  Constitutional: Negative for activity change, appetite change and fever.  HENT: Negative for congestion and rhinorrhea.   Eyes: Negative for discharge.  Respiratory: Negative for cough and wheezing.   Cardiovascular: Negative for chest pain.  Gastrointestinal: Negative for abdominal pain, blood in stool and vomiting.  Genitourinary: Negative for difficulty urinating and frequency.  Musculoskeletal: Negative for neck pain.  Skin: Negative for rash.  Allergic/Immunologic: Negative for  environmental allergies and food allergies.  Neurological: Negative for weakness and headaches.  Psychiatric/Behavioral: Negative for agitation.  All other systems reviewed and are negative.      Objective:   Physical Exam Constitutional:      Appearance: He is well-developed.  HENT:     Head: Normocephalic and atraumatic.     Right Ear: External ear normal.     Left Ear: External ear normal.     Nose: Nose normal.  Eyes:     Pupils: Pupils are equal, round, and reactive to light.  Neck:     Musculoskeletal: Normal range of motion and neck supple.     Thyroid: No thyromegaly.  Cardiovascular:     Rate and Rhythm: Normal rate and regular rhythm.     Heart sounds: Normal heart sounds. No murmur.  Pulmonary:     Effort: Pulmonary effort is normal. No respiratory distress.     Breath sounds: Normal breath sounds. No wheezing.  Abdominal:     General: Bowel sounds are normal. There is no distension.     Palpations: Abdomen is soft. There is no mass.     Tenderness: There is no abdominal tenderness.  Genitourinary:    Penis: Normal.   Musculoskeletal: Normal range of motion.  Lymphadenopathy:     Cervical: No cervical adenopathy.  Skin:    General: Skin is warm and dry.     Findings: No erythema.  Neurological:     Mental Status: He is alert.  Motor: No abnormal muscle tone.  Psychiatric:        Behavior: Behavior normal.        Judgment: Judgment normal.           Assessment & Plan:  Impression wellness exam.  Diet discussed.  Exercise discussed vaccines discussed.  Flu shot given.  Appropriate blood work last colonoscopy done ten yrs ago, having to coordinate covid 19  2.  Hypertension good control discussed maintain same meds  3.  Hyperlipidemia status uncertain maintain same meds diet discussed  4.  Lateral epicondylitis forearm strap recommended  Follow-up in 6 months

## 2019-03-12 DIAGNOSIS — Z79899 Other long term (current) drug therapy: Secondary | ICD-10-CM | POA: Diagnosis not present

## 2019-03-12 DIAGNOSIS — I10 Essential (primary) hypertension: Secondary | ICD-10-CM | POA: Diagnosis not present

## 2019-03-12 DIAGNOSIS — E7800 Pure hypercholesterolemia, unspecified: Secondary | ICD-10-CM | POA: Diagnosis not present

## 2019-03-12 DIAGNOSIS — Z0000 Encounter for general adult medical examination without abnormal findings: Secondary | ICD-10-CM | POA: Diagnosis not present

## 2019-03-12 DIAGNOSIS — Z125 Encounter for screening for malignant neoplasm of prostate: Secondary | ICD-10-CM | POA: Diagnosis not present

## 2019-03-13 LAB — BASIC METABOLIC PANEL
BUN/Creatinine Ratio: 13 (ref 10–24)
BUN: 15 mg/dL (ref 8–27)
CO2: 29 mmol/L (ref 20–29)
Calcium: 9.5 mg/dL (ref 8.6–10.2)
Chloride: 101 mmol/L (ref 96–106)
Creatinine, Ser: 1.13 mg/dL (ref 0.76–1.27)
GFR calc Af Amer: 81 mL/min/{1.73_m2} (ref >59)
GFR calc non Af Amer: 70 mL/min/{1.73_m2} (ref >59)
Glucose: 103 mg/dL — ABNORMAL HIGH (ref 65–99)
Potassium: 4.9 mmol/L (ref 3.5–5.2)
Sodium: 143 mmol/L (ref 134–144)

## 2019-03-13 LAB — HEPATIC FUNCTION PANEL
ALT: 20 IU/L (ref 0–44)
AST: 26 IU/L (ref 0–40)
Albumin: 4.6 g/dL (ref 3.8–4.8)
Alkaline Phosphatase: 74 IU/L (ref 39–117)
Bilirubin Total: 0.7 mg/dL (ref 0.0–1.2)
Bilirubin, Direct: 0.21 mg/dL (ref 0.00–0.40)
Total Protein: 6.7 g/dL (ref 6.0–8.5)

## 2019-03-13 LAB — LIPID PANEL
Chol/HDL Ratio: 3.3 ratio (ref 0.0–5.0)
Cholesterol, Total: 169 mg/dL (ref 100–199)
HDL: 52 mg/dL (ref >39)
LDL Chol Calc (NIH): 106 mg/dL — ABNORMAL HIGH (ref 0–99)
Triglycerides: 58 mg/dL (ref 0–149)
VLDL Cholesterol Cal: 11 mg/dL (ref 5–40)

## 2019-03-13 LAB — PSA: Prostate Specific Ag, Serum: 2.7 ng/mL (ref 0.0–4.0)

## 2019-03-18 ENCOUNTER — Encounter: Payer: Self-pay | Admitting: Family Medicine

## 2019-05-06 ENCOUNTER — Encounter: Payer: Self-pay | Admitting: Family Medicine

## 2019-05-07 ENCOUNTER — Other Ambulatory Visit: Payer: Self-pay

## 2019-05-07 ENCOUNTER — Ambulatory Visit (INDEPENDENT_AMBULATORY_CARE_PROVIDER_SITE_OTHER): Payer: BC Managed Care – PPO | Admitting: Family Medicine

## 2019-05-07 DIAGNOSIS — J329 Chronic sinusitis, unspecified: Secondary | ICD-10-CM

## 2019-05-07 DIAGNOSIS — J310 Chronic rhinitis: Secondary | ICD-10-CM | POA: Diagnosis not present

## 2019-05-07 MED ORDER — AMOXICILLIN-POT CLAVULANATE 875-125 MG PO TABS: 1.0000 | ORAL_TABLET | Freq: Two times a day (BID) | ORAL | 0 refills | Status: AC

## 2019-05-07 NOTE — Progress Notes (Signed)
   Subjective:  Audio  Patient ID: [name], male    DOB: Aug 07, 1955, 63 y.o.   MRN: [id number]  Sinus Problem This is a new problem. The current episode started in the past 7 days. Associated symptoms include congestion, coughing and sinus pressure. Treatments tried: otc sinus meds.      Review of Systems  HENT: Positive for congestion and sinus pressure.   Respiratory: Positive for cough.    Virtual Visit via Video Note  I connected with [name] on 05/07/19 at  1:10 PM EST by a video enabled telemedicine application and verified that I am speaking with the correct person using two identifiers.  Location: Patient: home Provider: office   I discussed the limitations of evaluation and management by telemedicine and the availability of in person appointments. The patient expressed understanding and agreed to proceed.  History of Present Illness:    Observations/Objective:   Assessment and Plan:   Follow Up Instructions:    I discussed the assessment and treatment plan with the patient. The patient was provided an opportunity to ask questions and all were answered. The patient agreed with the plan and demonstrated an understanding of the instructions.   The patient was advised to call back or seek an in-person evaluation if the symptoms worsen or if the condition fails to improve as anticipated.  I provided 18 minutes of non-face-to-face time during this encounter.  Patient notes that this presentation is very much like his usual sinus.  Some pressure.  Congestion.  Frontal headache.  No fever.  No sore throat.  No cough.  No shortness of breath.  No achiness.  No chills.  No known exposure to anyone else sick      Objective:   Physical Exam   Virtual     Assessment & Plan:  Impression probable sinusitis.  Antibiotics prescribed.  Warning signs discussed carefully.  If develops further symptoms or others in the family gets sick call us back right  away.  Doubt coronavirus rationale discussed

## 2019-05-12 ENCOUNTER — Encounter: Payer: Self-pay | Admitting: Family Medicine

## 2019-06-12 ENCOUNTER — Encounter: Payer: Self-pay | Admitting: Family Medicine

## 2019-06-17 ENCOUNTER — Other Ambulatory Visit: Payer: Self-pay

## 2019-06-17 ENCOUNTER — Ambulatory Visit (INDEPENDENT_AMBULATORY_CARE_PROVIDER_SITE_OTHER): Payer: BC Managed Care – PPO | Admitting: Family Medicine

## 2019-06-17 DIAGNOSIS — J310 Chronic rhinitis: Secondary | ICD-10-CM

## 2019-06-17 DIAGNOSIS — J329 Chronic sinusitis, unspecified: Secondary | ICD-10-CM

## 2019-06-17 DIAGNOSIS — R490 Dysphonia: Secondary | ICD-10-CM

## 2019-06-17 MED ORDER — CEFDINIR 300 MG PO CAPS: 300.0000 mg | ORAL_CAPSULE | Freq: Two times a day (BID) | ORAL | 0 refills | Status: DC

## 2019-06-17 NOTE — Progress Notes (Signed)
   Subjective:    Patient ID: [name], male    DOB: 1955-08-06, 63 y.o.   MRN: [id number]  HPI  Patient calls with abdominal discomfort and gas for awhile. Patient also having mucus to rise in his throat and hoarse-occassional cough- had recent  Antibiotic for sinus infection but this did not go away.Patient states the abdominal discomfort comes and goes and has eased up since started  Of note  Virtual Visit via Video Note  I connected with [name] on 06/17/19 at 10:00 AM EST by a video enabled telemedicine application and verified that I am speaking with the correct person using two identifiers.  Location: Patient: home Provider: office   I discussed the limitations of evaluation and management by telemedicine and the availability of in person appointments. The patient expressed understanding and agreed to proceed.  History of Present Illness:    Observations/Objective:   Assessment and Plan:   Follow Up Instructions:    I discussed the assessment and treatment plan with the patient. The patient was provided an opportunity to ask questions and all were answered. The patient agreed with the plan and demonstrated an understanding of the instructions.   The patient was advised to call back or seek an in-person evaluation if the symptoms worsen or if the condition fails to improve as anticipated.  I provided 20 minutes of non-face-to-face time during this encounter.   Pt had a couple dark stools   Voice has been hoarse for one mo  Pt smoked up until lately  Pt has contd to work  Poss exposures  No fevers in recent wks  Temp good   bm s not every single day  Of note the dark stools have improved.  Occur particularly when patient was taking Pepto-Bismol.  In the process of getting his colonoscopy has been delayed due to COVID-19  Hoarseness last several weeks productive cough at times.  Patient is a non-smoker  Review of Systems No vomiting  no diarrhea no rash    Objective:   Physical Exam  Virtual      Assessment & Plan:  Impression 1 subacute rhinosinusitis with substantial hoarseness discussed.  Trial of antibiotics.  Follow-up in the office in several weeks.  Importance of follow-up discussed  2.  GI symptoms/concerns clinically improved.  Colonoscopy pending we will also rediscuss at that visit  As per orders

## 2019-07-01 ENCOUNTER — Telehealth: Payer: Self-pay | Admitting: Family Medicine

## 2019-07-01 NOTE — Telephone Encounter (Signed)
Please advise. Thank you

## 2019-07-01 NOTE — Telephone Encounter (Signed)
Left message to return call 

## 2019-07-01 NOTE — Telephone Encounter (Signed)
Patient has appointment tomorrow for follow up on a cough.He states last 3 visit were by phone and wanting to come in office to discuss instead by phone. He still has cough and doesn't want a phone visit or virtual visit .Tried to explain with symptoms we can only do phone or video. Please advise. His appointment is at 8:30

## 2019-07-01 NOTE — Telephone Encounter (Signed)
I had actually requested pt be seen in office as f u at last visit (in my note)so plz do

## 2019-07-02 ENCOUNTER — Encounter: Payer: Self-pay | Admitting: Family Medicine

## 2019-07-02 ENCOUNTER — Telehealth: Payer: Self-pay | Admitting: Family Medicine

## 2019-07-02 ENCOUNTER — Ambulatory Visit: Payer: BC Managed Care – PPO | Admitting: Family Medicine

## 2019-07-02 ENCOUNTER — Ambulatory Visit (HOSPITAL_COMMUNITY)
Admission: RE | Admit: 2019-07-02 | Discharge: 2019-07-02 | Disposition: A | Discharge status: Home / Self Care | Payer: BC Managed Care – PPO | Source: Ambulatory Visit | Attending: Family Medicine | Admitting: Family Medicine

## 2019-07-02 ENCOUNTER — Other Ambulatory Visit: Payer: Self-pay

## 2019-07-02 VITALS — BP 132/80 | Temp 97.1°F | Ht 74.0 in | Wt 234.0 lb

## 2019-07-02 DIAGNOSIS — R490 Dysphonia: Secondary | ICD-10-CM | POA: Diagnosis not present

## 2019-07-02 DIAGNOSIS — R05 Cough: Secondary | ICD-10-CM | POA: Diagnosis not present

## 2019-07-02 DIAGNOSIS — R634 Abnormal weight loss: Secondary | ICD-10-CM | POA: Diagnosis not present

## 2019-07-02 DIAGNOSIS — F17200 Nicotine dependence, unspecified, uncomplicated: Secondary | ICD-10-CM | POA: Insufficient documentation

## 2019-07-02 DIAGNOSIS — R9389 Abnormal findings on diagnostic imaging of other specified body structures: Secondary | ICD-10-CM

## 2019-07-02 DIAGNOSIS — R059 Cough, unspecified: Secondary | ICD-10-CM

## 2019-07-02 MED ORDER — DOXYCYCLINE HYCLATE 100 MG PO TABS: 100.0000 mg | ORAL_TABLET | Freq: Two times a day (BID) | ORAL | 0 refills | Status: DC

## 2019-07-02 NOTE — Progress Notes (Signed)
   Subjective:    Patient ID: [name], male    DOB: 02-08-1956, 65 y.o.   MRN: [id number]  HPIfollow up on hoarseness. About the same as when he was seen 2 weeks ago.   Pt also concerned he has been losing weight. States he use to be 270 lbs.   Feels more sob recently  Has been hoarse for the past two months  Cont to work full time   Has been thru a couple arounds of antibiotic   First one helped the sinus aspect   The second one did not duo much   Has not smoked for over one week now     Review of Systems No headache no chest pain no shortness of breath    Objective:   Physical Exam  Alert active no acute distress HEENT neck normal no lymphadenopathy no thyromegaly lungs clear to auscultation rare cough during exam heart regular rate and rhythm.  Abdomen nontender.  Ankles without edema.      Assessment & Plan:  Impression #1 hoarseness for 2 months.  Unresponsive to antibiotics.  Occasional cough worse nocturnal.  Associated with weight loss of 30 pounds over recent months.  Very concerning.  Discussed at length with patient.  Blood work x-rays and ENT referral ordered.  Addendum.  Unfortunately the chest x-ray returned with evidence of an apex potential mass versus infiltrate.  Patient has been started on antibiotics.  CT scan of the chest necessary.  Discussed with patient.  Will order proceeding further recommendations based on results  Greater than 50% of this 25 minute non-face to face visit was spent in counseling and discussion and coordination of care regarding the above diagnosis/diagnosies

## 2019-07-02 NOTE — Telephone Encounter (Signed)
Diane from [facility] contacted office to make sure we were able to see pt chest xray results in chart. Please advise. Thank you

## 2019-07-03 LAB — BASIC METABOLIC PANEL
BUN/Creatinine Ratio: 18 (ref 10–24)
BUN: 17 mg/dL (ref 8–27)
CO2: 31 mmol/L — ABNORMAL HIGH (ref 20–29)
Calcium: 10.1 mg/dL (ref 8.6–10.2)
Chloride: 94 mmol/L — ABNORMAL LOW (ref 96–106)
Creatinine, Ser: 0.92 mg/dL (ref 0.76–1.27)
GFR calc Af Amer: 103 mL/min/{1.73_m2} (ref >59)
GFR calc non Af Amer: 89 mL/min/{1.73_m2} (ref >59)
Glucose: 115 mg/dL — ABNORMAL HIGH (ref 65–99)
Potassium: 4.6 mmol/L (ref 3.5–5.2)
Sodium: 139 mmol/L (ref 134–144)

## 2019-07-03 LAB — HEPATIC FUNCTION PANEL
ALT: 25 IU/L (ref 0–44)
AST: 23 IU/L (ref 0–40)
Albumin: 4.2 g/dL (ref 3.8–4.8)
Alkaline Phosphatase: 94 IU/L (ref 39–117)
Bilirubin Total: 0.5 mg/dL (ref 0.0–1.2)
Bilirubin, Direct: 0.23 mg/dL (ref 0.00–0.40)
Total Protein: 6.6 g/dL (ref 6.0–8.5)

## 2019-07-03 LAB — CBC WITH DIFFERENTIAL/PLATELET
Basophils Absolute: 0.1 10*3/uL (ref 0.0–0.2)
Basos: 1 %
EOS (ABSOLUTE): 0.2 10*3/uL (ref 0.0–0.4)
Eos: 2 %
Hematocrit: 43.9 % (ref 37.5–51.0)
Hemoglobin: 15.1 g/dL (ref 13.0–17.7)
Immature Grans (Abs): 0 10*3/uL (ref 0.0–0.1)
Immature Granulocytes: 0 %
Lymphocytes Absolute: 0.9 10*3/uL (ref 0.7–3.1)
Lymphs: 9 %
MCH: 31.1 pg (ref 26.6–33.0)
MCHC: 34.4 g/dL (ref 31.5–35.7)
MCV: 91 fL (ref 79–97)
Monocytes Absolute: 0.9 10*3/uL (ref 0.1–0.9)
Monocytes: 10 %
Neutrophils Absolute: 7.6 10*3/uL — ABNORMAL HIGH (ref 1.4–7.0)
Neutrophils: 78 %
Platelets: 326 10*3/uL (ref 150–450)
RBC: 4.85 x10E6/uL (ref 4.14–5.80)
RDW: 11.9 % (ref 11.6–15.4)
WBC: 9.6 10*3/uL (ref 3.4–10.8)

## 2019-07-03 LAB — TSH: TSH: 0.594 u[IU]/mL (ref 0.450–4.500)

## 2019-07-03 LAB — T4: T4, Total: 7.5 ug/dL (ref 4.5–12.0)

## 2019-07-05 ENCOUNTER — Encounter: Payer: Self-pay | Admitting: Family Medicine

## 2019-07-05 NOTE — Telephone Encounter (Signed)
Called pt and told him the scan is not scheduled yet and I would send [name] a message to see if scan had been approved yet.

## 2019-07-09 NOTE — Telephone Encounter (Signed)
Called pt and his wife states he is at work til 8 pm. I asked to let him know to check his mychart that brendale had sent him a message and she states she will.

## 2019-07-11 DIAGNOSIS — J3801 Paralysis of vocal cords and larynx, unilateral: Secondary | ICD-10-CM | POA: Diagnosis not present

## 2019-07-11 DIAGNOSIS — R490 Dysphonia: Secondary | ICD-10-CM | POA: Diagnosis not present

## 2019-07-12 ENCOUNTER — Ambulatory Visit
Admission: RE | Admit: 2019-07-12 | Discharge: 2019-07-12 | Disposition: A | Discharge status: Home / Self Care | Payer: BC Managed Care – PPO | Source: Ambulatory Visit | Attending: Family Medicine | Admitting: Family Medicine

## 2019-07-12 ENCOUNTER — Ambulatory Visit (INDEPENDENT_AMBULATORY_CARE_PROVIDER_SITE_OTHER): Payer: BC Managed Care – PPO | Admitting: Family Medicine

## 2019-07-12 ENCOUNTER — Other Ambulatory Visit: Payer: Self-pay

## 2019-07-12 DIAGNOSIS — R05 Cough: Secondary | ICD-10-CM

## 2019-07-12 DIAGNOSIS — C3412 Malignant neoplasm of upper lobe, left bronchus or lung: Secondary | ICD-10-CM | POA: Diagnosis not present

## 2019-07-12 DIAGNOSIS — R634 Abnormal weight loss: Secondary | ICD-10-CM

## 2019-07-12 DIAGNOSIS — R9389 Abnormal findings on diagnostic imaging of other specified body structures: Secondary | ICD-10-CM

## 2019-07-12 DIAGNOSIS — C3492 Malignant neoplasm of unspecified part of left bronchus or lung: Secondary | ICD-10-CM

## 2019-07-12 DIAGNOSIS — R490 Dysphonia: Secondary | ICD-10-CM

## 2019-07-12 DIAGNOSIS — C7951 Secondary malignant neoplasm of bone: Secondary | ICD-10-CM | POA: Diagnosis not present

## 2019-07-12 DIAGNOSIS — R059 Cough, unspecified: Secondary | ICD-10-CM

## 2019-07-12 DIAGNOSIS — C7800 Secondary malignant neoplasm of unspecified lung: Secondary | ICD-10-CM | POA: Diagnosis not present

## 2019-07-12 DIAGNOSIS — F17200 Nicotine dependence, unspecified, uncomplicated: Secondary | ICD-10-CM

## 2019-07-12 MED ORDER — IOPAMIDOL (ISOVUE-300) INJECTION 61%
75.0000 mL | Freq: Once | INTRAVENOUS | Status: AC | PRN
  Administered 2019-07-12: 75 mL via INTRAVENOUS

## 2019-07-13 ENCOUNTER — Encounter: Payer: Self-pay | Admitting: Family Medicine

## 2019-07-13 NOTE — Progress Notes (Signed)
Patient connects with the following this discussion.  To summarize he has lost weight in recent months.  And developed hoarseness.  Chest x-ray revealed an upper left lobe probable mass.  Patient had substantial hoarseness and saw the ENT doctor just yesterday.  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  There was no pharyngeal or laryngeal mass.  We are connecting today primarily because the results of his CT scan.  See results.  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  There is nodal involvement.  There is tracheal shift present.  There is even potential rib involvement  All of this is discussed with the patient today.  Patient was advised this almost certainly represents cancer and will need to be aggressively treated if the patient stands any hope of cure.  We discussed potential surgery/radiation/chemotherapy.  We discussed options in terms of resources  I advised him our local oncologist is top-notch, and with the standard of care endorsement of cancer registry is, he can be certain that he will be provided excellent care locally.  Questions answered  I also called and spoke with local hematology oncologist.  Greater than 50% of this 30 minute non-face to face visit was spent in counseling and discussion and coordination of care regarding the above diagnosis/diagnosies

## 2019-07-15 ENCOUNTER — Inpatient Hospital Stay (HOSPITAL_COMMUNITY): Payer: BC Managed Care – PPO | Attending: Hematology | Admitting: Hematology

## 2019-07-15 ENCOUNTER — Other Ambulatory Visit: Payer: Self-pay

## 2019-07-15 ENCOUNTER — Encounter (HOSPITAL_COMMUNITY): Payer: Self-pay | Admitting: Hematology

## 2019-07-15 ENCOUNTER — Encounter: Payer: Self-pay | Admitting: Family Medicine

## 2019-07-15 VITALS — BP 100/80 | HR 115 | Temp 97.3°F | Resp 20 | Ht 72.0 in | Wt 227.4 lb

## 2019-07-15 DIAGNOSIS — C772 Secondary and unspecified malignant neoplasm of intra-abdominal lymph nodes: Secondary | ICD-10-CM | POA: Insufficient documentation

## 2019-07-15 DIAGNOSIS — R05 Cough: Secondary | ICD-10-CM | POA: Diagnosis not present

## 2019-07-15 DIAGNOSIS — J90 Pleural effusion, not elsewhere classified: Secondary | ICD-10-CM | POA: Insufficient documentation

## 2019-07-15 DIAGNOSIS — F17210 Nicotine dependence, cigarettes, uncomplicated: Secondary | ICD-10-CM | POA: Insufficient documentation

## 2019-07-15 DIAGNOSIS — R634 Abnormal weight loss: Secondary | ICD-10-CM | POA: Diagnosis not present

## 2019-07-15 DIAGNOSIS — R490 Dysphonia: Secondary | ICD-10-CM | POA: Diagnosis not present

## 2019-07-15 DIAGNOSIS — C3412 Malignant neoplasm of upper lobe, left bronchus or lung: Secondary | ICD-10-CM | POA: Insufficient documentation

## 2019-07-15 DIAGNOSIS — I313 Pericardial effusion (noninflammatory): Secondary | ICD-10-CM | POA: Diagnosis not present

## 2019-07-15 DIAGNOSIS — R9389 Abnormal findings on diagnostic imaging of other specified body structures: Secondary | ICD-10-CM | POA: Diagnosis not present

## 2019-07-15 DIAGNOSIS — I10 Essential (primary) hypertension: Secondary | ICD-10-CM | POA: Insufficient documentation

## 2019-07-15 DIAGNOSIS — I2510 Atherosclerotic heart disease of native coronary artery without angina pectoris: Secondary | ICD-10-CM | POA: Diagnosis not present

## 2019-07-15 DIAGNOSIS — C7951 Secondary malignant neoplasm of bone: Secondary | ICD-10-CM | POA: Insufficient documentation

## 2019-07-15 DIAGNOSIS — R918 Other nonspecific abnormal finding of lung field: Secondary | ICD-10-CM

## 2019-07-15 DIAGNOSIS — Z79899 Other long term (current) drug therapy: Secondary | ICD-10-CM

## 2019-07-15 DIAGNOSIS — R0602 Shortness of breath: Secondary | ICD-10-CM

## 2019-07-15 DIAGNOSIS — I700 Atherosclerosis of aorta: Secondary | ICD-10-CM | POA: Diagnosis not present

## 2019-07-15 NOTE — Progress Notes (Signed)
Oncology Navigator Note: I met with patient and his wife during the visit today with Dr. Delton Coombes. I explained how I am involved in his care. I provided my contact information and advised for them to call me should they have any questions or concerns.

## 2019-07-15 NOTE — Patient Instructions (Signed)
[place] at [facility] Discharge Instructions  You were seen today by Dr. Delton Coombes. He went over your history, family history and how you've been feeling. He went over your scan results. He will schedule you for a MRI of your brain as well as a PET scan. He will also get you scheduled for a thoracentesis and a biopsy of the lung mass. He will see you back after your scans for follow up.   Thank you for choosing [facility] to provide your oncology and hematology care.  To afford each patient quality time with our provider, please arrive at least 15 minutes before your scheduled appointment time.   If you have a lab appointment with the [facility] please come in thru the  Main Entrance and check in at the main information desk  You need to re-schedule your appointment should you arrive 10 or more minutes late.  We strive to give you quality time with our providers, and arriving late affects you and other patients whose appointments are after yours.  Also, if you no show three or more times for appointments you may be dismissed from the clinic at the providers discretion.     Again, thank you for choosing [facility].  Our hope is that these requests will decrease the amount of time that you wait before being seen by our physicians.       _____________________________________________________________  Should you have questions after your visit to [facility], please contact our office at [phone] between the hours of 8:00 a.m. and 4:30 p.m.  Voicemails left after 4:00 p.m. will not be returned until the following business day.  For prescription refill requests, have your pharmacy contact our office and allow 72 hours.    Cancer Center Support Programs:   > Cancer Support Group  2nd Tuesday of the month 1pm-2pm, Journey Room

## 2019-07-15 NOTE — Progress Notes (Signed)
[facility] NOTE  Patient Care Team: Mikey Kirschner, MD as PCP - General (Family Medicine)  CHIEF COMPLAINTS/PURPOSE OF CONSULTATION:  Left upper lobe bronchogenic carcinoma.  HISTORY OF PRESENTING ILLNESS:  [name] 64 y.o. male is seen in consultation today at the request of Dr. Mickie Hillier for concerning findings on the CT of the chest.  This patient had hoarseness for the last couple of months with associated 30 pound weight loss.  Chest x-ray on 07/02/2019 showed left lung mass with pleural effusion.  CT of the chest with contrast on 07/12/2019 showed inferior left upper lobe/lingular central obstructing lung mass measuring 11.5 x 7.7 cm with a trace right and moderate left-sided pleural effusions.  Left pleural implants posteriorly at 2.2 cm and anteriorly at 2.6 cm.  Extensive mediastinal adenopathy.  Upper abdominal adenopathy with free aortic node measuring 1.4 cm.  Osseous destruction of the anterior left third rib.  Small pericardial effusion present.  He is also reporting shortness of breath while talking as well as while exerting.  Denies any headaches or vision changes.  He reports smoking cigarettes half pack per day for 42 years.  He reportedly quit 22 months ago.  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  He is accompanied by his wife today.  He reports that no immediate family member was diagnosed with cancer.  MEDICAL HISTORY:  Past Medical History:  Diagnosis Date  . ED (erectile dysfunction)   . Hyperlipidemia   . Hypertension     SURGICAL HISTORY: Past Surgical History:  Procedure Laterality Date  . APPENDECTOMY    . COLONOSCOPY      SOCIAL HISTORY: Social History   Socioeconomic History  . Marital status: Married    Spouse name: Not on file  . Number of children: Not on file  . Years of education: Not on file  . Highest education level: Not on file  Occupational History  . Not on file  Tobacco Use  . Smoking  status: Current Some Day Smoker  . Smokeless tobacco: Never Used  Substance and Sexual Activity  . Alcohol use: Not on file  . Drug use: Not on file  . Sexual activity: Not on file  Other Topics Concern  . Not on file  Social History Narrative  . Not on file   Social Determinants of Health   Financial Resource Strain: Low Risk   . Difficulty of Paying Living Expenses: Not hard at all  Food Insecurity: No Food Insecurity  . Worried About Charity fundraiser in the Last Year: Never true  . Ran Out of Food in the Last Year: Never true  Transportation Needs: No Transportation Needs  . Lack of Transportation (Medical): No  . Lack of Transportation (Non-Medical): No  Physical Activity:   . Days of Exercise per Week: Not on file  . Minutes of Exercise per Session: Not on file  Stress: No Stress Concern Present  . Feeling of Stress : Not at all  Social Connections: Slightly Isolated  . Frequency of Communication with Friends and Family: Once a week  . Frequency of Social Gatherings with Friends and Family: Never  . Attends Religious Services: More than 4 times per year  . Active Member of Clubs or Organizations: Yes  . Attends Archivist Meetings: 1 to 4 times per year  . Marital Status: Married  Human resources officer Violence: Not At Risk  . Fear of Current or Ex-Partner: No  .  Emotionally Abused: No  . Physically Abused: No  . Sexually Abused: No    FAMILY HISTORY: History reviewed. No pertinent family history.  ALLERGIES:  has No Known Allergies.  MEDICATIONS:  Current Outpatient Medications  Medication Sig Dispense Refill  . aspirin EC 81 MG tablet Take 81 mg by mouth daily.    . hydrochlorothiazide (HYDRODIURIL) 25 MG tablet One tablet daily in morning 30 tablet 5  . lisinopril (ZESTRIL) 40 MG tablet Take 1 tablet (40 mg total) by mouth daily. 30 tablet 5  . Multiple Vitamin (MULTIVITAMIN) tablet Take 1 tablet by mouth daily.    . pravastatin (PRAVACHOL) 20 MG  tablet TAKE 1 TABLET (20 MG TOTAL) BY MOUTH DAILY. 30 tablet 5  . TRAVATAN Z 0.004 % SOLN ophthalmic solution INSTILL 1 DROP TO BOTH EYES AT BEDTIME  6   No current facility-administered medications for this visit.    REVIEW OF SYSTEMS:   Constitutional: Denies fevers, chills or abnormal night sweats Eyes: Denies blurriness of vision, double vision or watery eyes Ears, nose, mouth, throat, and face: Denies mucositis or sore throat Respiratory: Positive for cough, shortness of breath.  Chest soreness from coughing. Cardiovascular: Denies palpitation, chest discomfort or lower extremity swelling Gastrointestinal:  Denies nausea, heartburn or change in bowel habits Skin: Denies abnormal skin rashes Lymphatics: Denies new lymphadenopathy or easy bruising Neurological:Denies numbness, tingling or new weaknesses Behavioral/Psych: Mood is stable, no new changes  All other systems were reviewed with the patient and are negative.  PHYSICAL EXAMINATION: ECOG PERFORMANCE STATUS: 1 - Symptomatic but completely ambulatory  Vitals:   07/15/19 1348  BP: 100/80  Pulse: (!) 115  Resp: 20  Temp: (!) 97.3 F (36.3 C)  SpO2: 95%   Filed Weights   07/15/19 1348  Weight: 227 lb 6.4 oz (103.1 kg)    GENERAL:alert, no distress and comfortable SKIN: skin color, texture, turgor are normal, no rashes or significant lesions EYES: normal, conjunctiva are pink and non-injected, sclera clear OROPHARYNX:no exudate, no erythema and lips, buccal mucosa, and tongue normal  NECK: supple, thyroid normal size, non-tender, without nodularity LYMPH:  no palpable lymphadenopathy in the cervical, axillary or inguinal LUNGS: Decreased breath sounds at the left base.  Occasional wheezing present. HEART: regular rate & rhythm and no murmurs and no lower extremity edema ABDOMEN:abdomen soft, non-tender and normal bowel sounds Musculoskeletal:no cyanosis of digits and no clubbing  PSYCH: alert & oriented x 3 with  fluent speech NEURO: no focal motor/sensory deficits  LABORATORY DATA:  I have reviewed the data as listed Lab Results  Component Value Date   WBC 9.6 07/02/2019   HGB 15.1 07/02/2019   HCT 43.9 07/02/2019   MCV 91 07/02/2019   PLT 326 07/02/2019     Chemistry      Component Value Date/Time   NA 139 07/02/2019 0938   K 4.6 07/02/2019 0938   CL 94 (L) 07/02/2019 0938   CO2 31 (H) 07/02/2019 0938   BUN 17 07/02/2019 0938   CREATININE 0.92 07/02/2019 0938   CREATININE 1.10 07/13/2013 0907      Component Value Date/Time   CALCIUM 10.1 07/02/2019 0938   ALKPHOS 94 07/02/2019 0938   AST 23 07/02/2019 0938   ALT 25 07/02/2019 0938   BILITOT 0.5 07/02/2019 0938       RADIOGRAPHIC STUDIES: I have personally reviewed the radiological images as listed and agreed with the findings in the report. CXR  Result Date: 07/02/2019 CLINICAL DATA:  Cough, weight loss,  smoking EXAM: CHEST - 2 VIEW COMPARISON:  None. FINDINGS: Moderate to large left-sided pleural effusion with associated left basilar opacity. There is a 1.9 cm irregular nodular density within the left lung apex. There is an additional 1.2 cm nodular density projecting over the right lung apex. Heart size is partially obscured. No pneumothorax. Osseous structures appear intact. IMPRESSION: Marked 1.9 cm irregular nodular density within the left lung apex and 1.2 cm nodular density projecting over the right lung apex. Moderate to large left-sided pleural effusion with associated left basilar opacity. Although these findings may be infectious or inflammatory, underlying malignancy is a concern given the patient's history. Further evaluation with contrast-enhanced CT of the chest is recommended. These results will be called to the ordering clinician or representative by the Radiologist Assistant, and communication documented in the PACS or zVision Dashboard. Electronically Signed   By: Davina Poke D.O.   On: 07/02/2019 11:27   CT  Chest W Contrast  Result Date: 07/12/2019 CLINICAL DATA:  Abnormal chest radiograph. Weight loss. Cough. Shortness of breath. Symptoms for 2.5 months. Hypertension. Ex-smoker, quitting in December. EXAM: CT CHEST WITH CONTRAST TECHNIQUE: Multidetector CT imaging of the chest was performed during intravenous contrast administration. CONTRAST:  75m ISOVUE-300 IOPAMIDOL (ISOVUE-300) INJECTION 61% COMPARISON:  Chest radiograph 07/02/2019.  Abdominal CT 08/14/2004. FINDINGS: Cardiovascular: Mild motion degradation throughout. Bovine arch. Aortic and branch vessel atherosclerosis. Tortuous thoracic aorta. Normal heart size with small pericardial effusion. Left main and LAD coronary artery atherosclerosis. Left upper lobe pulmonary artery branch involvement by tumor. No central pulmonary embolism, on this non-dedicated study. Mediastinum/Nodes: 8 mm left supraclavicular node on 10/02. Left subpectoral adenopathy at 1.0 cm on 24/2. Extensive mediastinal adenopathy, including a high left mediastinal node of 2.7 cm on 36/2. Subcarinal node measures 1.7 cm on 75/2. Right hilar adenopathy at 2.0 cm on 64/2. Mediastinal shift to the right, secondary to tumor burden on the left. Direct mediastinal extension in the AP window region on 58/2. Lungs/Pleura: Trace right and moderate left-sided pleural effusions. Left pleural implants, including posteriorly at 2.2 cm on 133/2 and in anteriorly at 2.6 cm on 58/2. Left upper lobe endobronchial displacement and obstruction, including on 77/3. Right-sided pulmonary nodules, including right upper lobe 9 mm nodule on 40/3 and 8 mm more inferior right upper lobe pulmonary nodule on 66/3. Inferior left upper lobe/lingular central obstructive lung mass, including on the order of 11.5 x 7.7 cm on 74/2. More cephalad left upper lobe pulmonary nodularity, including at 1.4 cm on 43/3. Smooth left apical septal thickening, suspicious for lymphangitic tumor spread. Upper Abdomen: Motion  degradation continuing into the upper abdomen, moderate in severity. Grossly normal imaged liver, spleen, stomach, gallbladder, pancreas, adrenal glands, kidneys. Upper abdominal adenopathy, with a preaortic node measuring 1.4 cm on 167/2. Musculoskeletal: Osseous destruction of the anterior left third rib on 55/2. IMPRESSION: 1. Left upper lobe/lingular primary bronchogenic carcinoma. 2. 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 3. Motion degraded exam. 4. Coronary artery atherosclerosis. Aortic Atherosclerosis (ICD10-I70.0). 5. Small pericardial effusion. Electronically Signed   By: KAbigail MiyamotoM.D.   On: 07/12/2019 09:13    ASSESSMENT & PLAN:  Mass of upper lobe of left lung 1.  Left upper lobe bronchogenic carcinoma: -Presenting symptoms: Hoarseness and 30 pound weight loss in the last 2 1/2 months.  Also reported shortness of breath on exertion. -Smoked cigarettes half pack per day for 42 years,  quit in the last 2 months. -Chest x-ray on 07/02/2019 showed left lung mass and pleural effusion. -CT of the chest with contrast on 07/12/2019 shows inferior left upper lobe/lingular central obstructive lung mass measuring 11.5 x 7.7 cm.  Trace right and moderate left-sided pleural effusions.  Left pleural implants posteriorly at 2.2 cm and anteriorly at 2.6 cm.  Extensive mediastinal adenopathy.  Upper abdominal adenopathy with preaortic node measuring 1.4 cm.  Osseous destruction of the anterior left third rib. Small pericardial effusion. -I have reviewed scans and images with the patient and his wife. -I have recommended biopsy and further testing for PD-L1 and Foundation 1.  We will reach out to interventional radiologist for core biopsies. -We will schedule MRI of the brain with and without gadolinium and a PET scan. -I will see him back after the biopsy to review results.  2.  Shortness of breath: -He is  getting short of breath when talking and also reports shortness of breath on exertion. -We will ask interventional radiology for thoracentesis.  Orders Placed This Encounter  Procedures  . MR Brain W Wo Contrast    Standing Status:   Future    Standing Expiration Date:   07/14/2020    Order Specific Question:   ** REASON FOR EXAM (FREE TEXT)    Answer:   lung mass    Order Specific Question:   If indicated for the ordered procedure, I authorize the administration of contrast media per Radiology protocol    Answer:   Yes    Order Specific Question:   What is the patient's sedation requirement?    Answer:   No Sedation    Order Specific Question:   Does the patient have a pacemaker or implanted devices?    Answer:   No    Order Specific Question:   Use SRS Protocol?    Answer:   No    Order Specific Question:   Radiology Contrast Protocol - do NOT remove file path    Answer:   \\[email or web address]    Order Specific Question:   Preferred imaging location?    Answer:   [facility] (table limit-350lbs)  . NM PET Image Initial (PI) Skull Base To Thigh    Standing Status:   Future    Standing Expiration Date:   07/14/2020    Order Specific Question:   ** REASON FOR EXAM (FREE TEXT)    Answer:   lung mass    Order Specific Question:   If indicated for the ordered procedure, I authorize the administration of a radiopharmaceutical per Radiology protocol    Answer:   Yes    Order Specific Question:   Preferred imaging location?    Answer:   [facility]    Order Specific Question:   Radiology Contrast Protocol - do NOT remove file path    Answer:   \\[email or web address]  . CT Biopsy    Standing Status:   Future    Standing Expiration Date:   07/14/2020    Order Specific Question:   Lab orders requested (DO NOT place separate lab orders, these will be automatically ordered during procedure specimen collection):    Answer:   Surgical  Pathology    Order Specific Question:   Reason for Exam (SYMPTOM  OR DIAGNOSIS REQUIRED)    Answer:   lung mass    Order Specific Question:   Preferred location?    Answer:   [facility]    Order Specific Question:  Radiology Contrast Protocol - do NOT remove file path    Answer:   \\[email or web address]  . CT THORACENTESIS    Standing Status:   Future    Standing Expiration Date:   09/11/2020    Order Specific Question:   Are labs required for specimen collection?    Answer:   No    Order Specific Question:   Reason for Exam (SYMPTOM  OR DIAGNOSIS REQUIRED)    Answer:   lung mass, plerual effusion    Order Specific Question:   Preferred imaging location?    Answer:   [facility]    Order Specific Question:   Radiology Contrast Protocol - do NOT remove file path    Answer:   \\[email or web address].[email or web address]    All questions were answered. The patient knows to call the clinic with any problems, questions or concerns.     Derek Jack, MD 07/15/2019 2:27 PM

## 2019-07-15 NOTE — Assessment & Plan Note (Addendum)
1.  Left upper lobe bronchogenic carcinoma: -Presenting symptoms: Hoarseness and 30 pound weight loss in the last 2 1/2 months.  Also reported shortness of breath on exertion. -Smoked cigarettes half pack per day for 42 years, quit in the last 2 months. -Chest x-ray on 07/02/2019 showed left lung mass and pleural effusion. -CT of the chest with contrast on 07/12/2019 shows inferior left upper lobe/lingular central obstructive lung mass measuring 11.5 x 7.7 cm.  Trace right and moderate left-sided pleural effusions.  Left pleural implants posteriorly at 2.2 cm and anteriorly at 2.6 cm.  Extensive mediastinal adenopathy.  Upper abdominal adenopathy with preaortic node measuring 1.4 cm.  Osseous destruction of the anterior left third rib. Small pericardial effusion. -I have reviewed scans and images with the patient and his wife. -I have recommended biopsy and further testing for PD-L1 and Foundation 1.  We will reach out to interventional radiologist for core biopsies. -We will schedule MRI of the brain with and without gadolinium and a PET scan. -I will see him back after the biopsy to review results.  2.  Shortness of breath: -He is getting short of breath when talking and also reports shortness of breath on exertion. -We will ask interventional radiology for thoracentesis.

## 2019-07-16 ENCOUNTER — Encounter (HOSPITAL_COMMUNITY): Payer: Self-pay | Admitting: Radiology

## 2019-07-16 NOTE — Progress Notes (Signed)
Tobiah L. Shere Male, 64 y.o., February 15, 1956 MRN:  [id number] Phone:  [phone] (H) PCP:  Mikey Kirschner, MD Coverage:  Sherre Poot [employer] Ppo Next Appt With Radiology ([facility] PET) 07/22/2019 at 1:00 PM  RE: CT Biopsy / CT Thoracentesis Received: Yesterday Message Contents  Markus Daft, MD  Garth Bigness D  CT guided core biopsy of left 3rd rib lesion / pleural lesion. Send pleural fluid at same time.   [place]       Previous Messages   ----- Message -----  From: Garth Bigness D  Sent: 07/15/2019  3:59 PM EST  To: [facility] Procedure Requests  Subject: CT Biopsy / CT Thoracentesis           Procedure:  CT Biopsy / CT Thoracentesis   Reason: Mass of upper lobe of left lung, lung mass, Pleural effusion, CT thoracentesis to be done same day as biopsy per provider   History: CT in computer, NM PET scheduled for 07/22/19, MR Brain scheduled on 07/24/19   Provider: Derek Jack   Provider Contact: [phone]

## 2019-07-16 NOTE — Telephone Encounter (Signed)
Daniel Kirschner, MD   We can do that . I recommend a 30 day initial leave while we are3 sorting out all the patient will need to have done for him. I anticipate this will likely stretch beyond that time frame

## 2019-07-18 ENCOUNTER — Telehealth: Payer: Self-pay | Admitting: Family Medicine

## 2019-07-18 DIAGNOSIS — Z029 Encounter for administrative examinations, unspecified: Secondary | ICD-10-CM

## 2019-07-18 NOTE — Telephone Encounter (Signed)
Patient had FMLA  Dropped off to be completed. I completed what I could,please review forms and complete highlighted areas,date and sign. Forms are in your yellow folder for review.

## 2019-07-19 ENCOUNTER — Encounter (HOSPITAL_COMMUNITY): Payer: Self-pay | Admitting: *Deleted

## 2019-07-19 ENCOUNTER — Telehealth: Payer: Self-pay | Admitting: Family Medicine

## 2019-07-19 NOTE — Telephone Encounter (Signed)
Patient had disability forms faxed over to be completed. I filled in what I could the rest has to be completed by physician. In your yellow folder to review and complete

## 2019-07-22 ENCOUNTER — Other Ambulatory Visit: Payer: Self-pay

## 2019-07-22 ENCOUNTER — Encounter (HOSPITAL_COMMUNITY)
Admission: RE | Admit: 2019-07-22 | Discharge: 2019-07-22 | Disposition: A | Discharge status: Home / Self Care | Payer: BC Managed Care – PPO | Source: Ambulatory Visit | Attending: Hematology | Admitting: Hematology

## 2019-07-22 DIAGNOSIS — R918 Other nonspecific abnormal finding of lung field: Secondary | ICD-10-CM | POA: Insufficient documentation

## 2019-07-22 DIAGNOSIS — C7951 Secondary malignant neoplasm of bone: Secondary | ICD-10-CM | POA: Diagnosis not present

## 2019-07-22 DIAGNOSIS — Z029 Encounter for administrative examinations, unspecified: Secondary | ICD-10-CM

## 2019-07-22 DIAGNOSIS — C3492 Malignant neoplasm of unspecified part of left bronchus or lung: Secondary | ICD-10-CM | POA: Diagnosis not present

## 2019-07-22 LAB — GLUCOSE, CAPILLARY: Glucose-Capillary: 104 mg/dL — ABNORMAL HIGH (ref 70–99)

## 2019-07-22 MED ORDER — FLUDEOXYGLUCOSE F - 18 (FDG) INJECTION
12.2500 | Freq: Once | INTRAVENOUS | Status: AC
  Administered 2019-07-22: 12.25 via INTRAVENOUS

## 2019-07-23 ENCOUNTER — Other Ambulatory Visit: Payer: Self-pay | Admitting: Radiology

## 2019-07-23 NOTE — Telephone Encounter (Signed)
Both forms are completed and up front for pick up. They have been faxed in.

## 2019-07-24 ENCOUNTER — Encounter: Payer: Self-pay | Admitting: Hematology

## 2019-07-24 ENCOUNTER — Ambulatory Visit (HOSPITAL_COMMUNITY): Admission: RE | Admit: 2019-07-24 | Payer: BC Managed Care – PPO | Source: Ambulatory Visit

## 2019-07-24 ENCOUNTER — Ambulatory Visit (HOSPITAL_COMMUNITY): Payer: BC Managed Care – PPO

## 2019-07-24 ENCOUNTER — Ambulatory Visit (HOSPITAL_COMMUNITY)
Admission: RE | Admit: 2019-07-24 | Discharge: 2019-07-24 | Disposition: A | Discharge status: Home / Self Care | Payer: BC Managed Care – PPO | Source: Ambulatory Visit | Attending: Radiology | Admitting: Radiology

## 2019-07-24 ENCOUNTER — Other Ambulatory Visit: Payer: Self-pay

## 2019-07-24 ENCOUNTER — Ambulatory Visit (HOSPITAL_COMMUNITY)
Admission: RE | Admit: 2019-07-24 | Discharge: 2019-07-24 | Disposition: A | Discharge status: Home / Self Care | Payer: BC Managed Care – PPO | Source: Ambulatory Visit | Attending: Hematology | Admitting: Hematology

## 2019-07-24 ENCOUNTER — Encounter (HOSPITAL_COMMUNITY): Payer: Self-pay

## 2019-07-24 DIAGNOSIS — C3412 Malignant neoplasm of upper lobe, left bronchus or lung: Secondary | ICD-10-CM | POA: Diagnosis not present

## 2019-07-24 DIAGNOSIS — F17200 Nicotine dependence, unspecified, uncomplicated: Secondary | ICD-10-CM | POA: Insufficient documentation

## 2019-07-24 DIAGNOSIS — U071 COVID-19: Secondary | ICD-10-CM | POA: Diagnosis not present

## 2019-07-24 DIAGNOSIS — C801 Malignant (primary) neoplasm, unspecified: Secondary | ICD-10-CM | POA: Diagnosis not present

## 2019-07-24 DIAGNOSIS — Z7982 Long term (current) use of aspirin: Secondary | ICD-10-CM | POA: Diagnosis not present

## 2019-07-24 DIAGNOSIS — R918 Other nonspecific abnormal finding of lung field: Secondary | ICD-10-CM | POA: Insufficient documentation

## 2019-07-24 DIAGNOSIS — C778 Secondary and unspecified malignant neoplasm of lymph nodes of multiple regions: Secondary | ICD-10-CM | POA: Insufficient documentation

## 2019-07-24 DIAGNOSIS — C7801 Secondary malignant neoplasm of right lung: Secondary | ICD-10-CM | POA: Insufficient documentation

## 2019-07-24 DIAGNOSIS — Z79899 Other long term (current) drug therapy: Secondary | ICD-10-CM | POA: Diagnosis not present

## 2019-07-24 DIAGNOSIS — C3490 Malignant neoplasm of unspecified part of unspecified bronchus or lung: Secondary | ICD-10-CM | POA: Diagnosis not present

## 2019-07-24 DIAGNOSIS — I10 Essential (primary) hypertension: Secondary | ICD-10-CM | POA: Diagnosis not present

## 2019-07-24 DIAGNOSIS — Z9889 Other specified postprocedural states: Secondary | ICD-10-CM | POA: Diagnosis not present

## 2019-07-24 DIAGNOSIS — E785 Hyperlipidemia, unspecified: Secondary | ICD-10-CM | POA: Insufficient documentation

## 2019-07-24 DIAGNOSIS — J3801 Paralysis of vocal cords and larynx, unilateral: Secondary | ICD-10-CM | POA: Diagnosis not present

## 2019-07-24 DIAGNOSIS — J90 Pleural effusion, not elsewhere classified: Secondary | ICD-10-CM | POA: Diagnosis not present

## 2019-07-24 DIAGNOSIS — C7951 Secondary malignant neoplasm of bone: Secondary | ICD-10-CM | POA: Diagnosis not present

## 2019-07-24 DIAGNOSIS — M899 Disorder of bone, unspecified: Secondary | ICD-10-CM | POA: Insufficient documentation

## 2019-07-24 DIAGNOSIS — I313 Pericardial effusion (noninflammatory): Secondary | ICD-10-CM | POA: Diagnosis not present

## 2019-07-24 DIAGNOSIS — R9389 Abnormal findings on diagnostic imaging of other specified body structures: Secondary | ICD-10-CM | POA: Insufficient documentation

## 2019-07-24 LAB — CBC WITH DIFFERENTIAL/PLATELET
Abs Immature Granulocytes: 0.06 10*3/uL (ref 0.00–0.07)
Basophils Absolute: 0 10*3/uL (ref 0.0–0.1)
Basophils Relative: 0 %
Eosinophils Absolute: 0 10*3/uL (ref 0.0–0.5)
Eosinophils Relative: 0 %
HCT: 45.1 % (ref 39.0–52.0)
Hemoglobin: 15 g/dL (ref 13.0–17.0)
Immature Granulocytes: 1 %
Lymphocytes Relative: 10 %
Lymphs Abs: 1 10*3/uL (ref 0.7–4.0)
MCH: 30.2 pg (ref 26.0–34.0)
MCHC: 33.3 g/dL (ref 30.0–36.0)
MCV: 90.7 fL (ref 80.0–100.0)
Monocytes Absolute: 0.9 10*3/uL (ref 0.1–1.0)
Monocytes Relative: 9 %
Neutro Abs: 8 10*3/uL — ABNORMAL HIGH (ref 1.7–7.7)
Neutrophils Relative %: 80 %
Platelets: 377 10*3/uL (ref 150–400)
RBC: 4.97 MIL/uL (ref 4.22–5.81)
RDW: 12.6 % (ref 11.5–15.5)
WBC: 10 10*3/uL (ref 4.0–10.5)
nRBC: 0 % (ref 0.0–0.2)

## 2019-07-24 LAB — GRAM STAIN

## 2019-07-24 LAB — RESPIRATORY PANEL BY RT PCR (FLU A&B, COVID)
Influenza A by PCR: NEGATIVE
Influenza B by PCR: NEGATIVE
SARS Coronavirus 2 by RT PCR: POSITIVE — AB

## 2019-07-24 LAB — GLUCOSE, PLEURAL OR PERITONEAL FLUID: Glucose, Fluid: 34 mg/dL

## 2019-07-24 LAB — BODY FLUID CELL COUNT WITH DIFFERENTIAL
Eos, Fluid: 0 %
Lymphs, Fluid: 70 %
Monocyte-Macrophage-Serous Fluid: 21 % — ABNORMAL LOW (ref 50–90)
Neutrophil Count, Fluid: 9 % (ref 0–25)
Total Nucleated Cell Count, Fluid: 1384 cu mm — ABNORMAL HIGH (ref 0–1000)

## 2019-07-24 LAB — PROTEIN, PLEURAL OR PERITONEAL FLUID: Total protein, fluid: 4.1 g/dL

## 2019-07-24 LAB — LACTATE DEHYDROGENASE, PLEURAL OR PERITONEAL FLUID: LD, Fluid: 1460 U/L — ABNORMAL HIGH (ref 3–23)

## 2019-07-24 LAB — PROTIME-INR
INR: 1.1 (ref 0.8–1.2)
Prothrombin Time: 13.8 seconds (ref 11.4–15.2)

## 2019-07-24 MED ORDER — FENTANYL CITRATE (PF) 100 MCG/2ML IJ SOLN
INTRAMUSCULAR | Status: AC | PRN
  Administered 2019-07-24 (×2): 50 ug via INTRAVENOUS

## 2019-07-24 MED ORDER — MIDAZOLAM HCL 2 MG/2ML IJ SOLN
INTRAMUSCULAR | Status: AC
  Filled 2019-07-24: qty 4

## 2019-07-24 MED ORDER — FLUMAZENIL 0.5 MG/5ML IV SOLN
INTRAVENOUS | Status: AC
  Filled 2019-07-24: qty 5

## 2019-07-24 MED ORDER — FENTANYL CITRATE (PF) 100 MCG/2ML IJ SOLN
INTRAMUSCULAR | Status: AC
  Filled 2019-07-24: qty 4

## 2019-07-24 MED ORDER — SODIUM CHLORIDE 0.9 % IV SOLN: INTRAVENOUS | Status: DC

## 2019-07-24 MED ORDER — NALOXONE HCL 0.4 MG/ML IJ SOLN
INTRAMUSCULAR | Status: AC
  Filled 2019-07-24: qty 1

## 2019-07-24 MED ORDER — LIDOCAINE-EPINEPHRINE 1 %-1:100000 IJ SOLN
INTRAMUSCULAR | Status: AC | PRN
  Administered 2019-07-24: 10 mL

## 2019-07-24 MED ORDER — LIDOCAINE HCL 1 % IJ SOLN
INTRAMUSCULAR | Status: AC
  Filled 2019-07-24: qty 20

## 2019-07-24 MED ORDER — MIDAZOLAM HCL 2 MG/2ML IJ SOLN
INTRAMUSCULAR | Status: AC | PRN
  Administered 2019-07-24 (×2): 1 mg via INTRAVENOUS

## 2019-07-24 NOTE — Consult Note (Signed)
Chief Complaint: Patient was seen in consultation today for image guided biopsy of left third rib lesion/pleural mass as well as left thoracentesis  Referring Physician(s): Vader  Supervising Physician: Corrie Mckusick  Patient Status: [facility] - Out-pt  History of Present Illness: [name] is a 64 y.o. male, ex-smoker, with history of hoarseness, productive cough, dyspnea, abdominal/back pain and recent PET scan revealing hypermetabolic left upper lobe lung mass with moderate left pleural effusion/pleural-based metastasis as well as contralateral pulmonary metastasis on the right, widespread thoracic nodal metastasis, associated mild upper abdominal /retroperitoneal nodal metastasis and mild left lower cervical nodal metastasis.  Also noted was osseous metastasis involving the left anterior third rib with focal hypermetabolism involving the right posterior vocal cord.  He presents today for image guided left third rib/pleural-based lesion biopsy and left thoracentesis for further evaluation.  Past Medical History:  Diagnosis Date  . ED (erectile dysfunction)   . Hyperlipidemia   . Hypertension     Past Surgical History:  Procedure Laterality Date  . APPENDECTOMY    . COLONOSCOPY      Allergies: Patient has no known allergies.  Medications: Prior to Admission medications   Medication Sig Start Date End Date Taking? Authorizing Provider  aspirin EC 81 MG tablet Take 81 mg by mouth daily.   Yes [provider]  hydrochlorothiazide (HYDRODIURIL) 25 MG tablet One tablet daily in morning 03/11/19  Yes Mikey Kirschner, MD  lisinopril (ZESTRIL) 40 MG tablet Take 1 tablet (40 mg total) by mouth daily. 03/11/19  Yes Mikey Kirschner, MD  Multiple Vitamin (MULTIVITAMIN) tablet Take 1 tablet by mouth daily.   Yes [provider]  pravastatin (PRAVACHOL) 20 MG tablet TAKE 1 TABLET (20 MG TOTAL) BY MOUTH DAILY. 03/11/19  Yes Mikey Kirschner, MD    TRAVATAN Z 0.004 % SOLN ophthalmic solution INSTILL 1 DROP TO BOTH EYES AT BEDTIME 10/05/15  Yes [provider]     History reviewed. No pertinent family history.  Social History   Socioeconomic History  . Marital status: Married    Spouse name: Not on file  . Number of children: Not on file  . Years of education: Not on file  . Highest education level: Not on file  Occupational History  . Not on file  Tobacco Use  . Smoking status: Current Some Day Smoker  . Smokeless tobacco: Never Used  Substance and Sexual Activity  . Alcohol use: Not on file  . Drug use: Not on file  . Sexual activity: Not on file  Other Topics Concern  . Not on file  Social History Narrative  . Not on file   Social Determinants of Health   Financial Resource Strain: Low Risk   . Difficulty of Paying Living Expenses: Not hard at all  Food Insecurity: No Food Insecurity  . Worried About Charity fundraiser in the Last Year: Never true  . Ran Out of Food in the Last Year: Never true  Transportation Needs: No Transportation Needs  . Lack of Transportation (Medical): No  . Lack of Transportation (Non-Medical): No  Physical Activity:   . Days of Exercise per Week: Not on file  . Minutes of Exercise per Session: Not on file  Stress: No Stress Concern Present  . Feeling of Stress : Not at all  Social Connections: Slightly Isolated  . Frequency of Communication with Friends and Family: Once a week  . Frequency of Social Gatherings with Friends and Family: Never  .  Attends Religious Services: More than 4 times per year  . Active Member of Clubs or Organizations: Yes  . Attends Archivist Meetings: 1 to 4 times per year  . Marital Status: Married     Review of Systems see above; denies fever, headache, chest pain, nausea, vomiting or bleeding  Vital Signs: Blood pressure 114/85, temperature 98.2, heart rate 119, respirations 20, O2 sat 95% room air   Physical Exam awake, alert.   Patient talks with hoarseness/whispered voice.  Chest with some inspiratory wheezing on right, diminished breath sounds left base; heart with tachycardic but regular rhythm.  Abdomen soft, positive bowel sounds, nontender.  No lower extremity edema.  Imaging: CXR  Result Date: 07/02/2019 CLINICAL DATA:  Cough, weight loss, smoking EXAM: CHEST - 2 VIEW COMPARISON:  None. FINDINGS: Moderate to large left-sided pleural effusion with associated left basilar opacity. There is a 1.9 cm irregular nodular density within the left lung apex. There is an additional 1.2 cm nodular density projecting over the right lung apex. Heart size is partially obscured. No pneumothorax. Osseous structures appear intact. IMPRESSION: Marked 1.9 cm irregular nodular density within the left lung apex and 1.2 cm nodular density projecting over the right lung apex. Moderate to large left-sided pleural effusion with associated left basilar opacity. Although these findings may be infectious or inflammatory, underlying malignancy is a concern given the patient's history. Further evaluation with contrast-enhanced CT of the chest is recommended. These results will be called to the ordering clinician or representative by the Radiologist Assistant, and communication documented in the PACS or zVision Dashboard. Electronically Signed   By: Davina Poke D.O.   On: 07/02/2019 11:27   CT Chest W Contrast  Result Date: 07/12/2019 CLINICAL DATA:  Abnormal chest radiograph. Weight loss. Cough. Shortness of breath. Symptoms for 2.5 months. Hypertension. Ex-smoker, quitting in December. EXAM: CT CHEST WITH CONTRAST TECHNIQUE: Multidetector CT imaging of the chest was performed during intravenous contrast administration. CONTRAST:  68mL ISOVUE-300 IOPAMIDOL (ISOVUE-300) INJECTION 61% COMPARISON:  Chest radiograph 07/02/2019.  Abdominal CT 08/14/2004. FINDINGS: Cardiovascular: Mild motion degradation throughout. Bovine arch. Aortic and branch vessel  atherosclerosis. Tortuous thoracic aorta. Normal heart size with small pericardial effusion. Left main and LAD coronary artery atherosclerosis. Left upper lobe pulmonary artery branch involvement by tumor. No central pulmonary embolism, on this non-dedicated study. Mediastinum/Nodes: 8 mm left supraclavicular node on 10/02. Left subpectoral adenopathy at 1.0 cm on 24/2. Extensive mediastinal adenopathy, including a high left mediastinal node of 2.7 cm on 36/2. Subcarinal node measures 1.7 cm on 75/2. Right hilar adenopathy at 2.0 cm on 64/2. Mediastinal shift to the right, secondary to tumor burden on the left. Direct mediastinal extension in the AP window region on 58/2. Lungs/Pleura: Trace right and moderate left-sided pleural effusions. Left pleural implants, including posteriorly at 2.2 cm on 133/2 and in anteriorly at 2.6 cm on 58/2. Left upper lobe endobronchial displacement and obstruction, including on 77/3. Right-sided pulmonary nodules, including right upper lobe 9 mm nodule on 40/3 and 8 mm more inferior right upper lobe pulmonary nodule on 66/3. Inferior left upper lobe/lingular central obstructive lung mass, including on the order of 11.5 x 7.7 cm on 74/2. More cephalad left upper lobe pulmonary nodularity, including at 1.4 cm on 43/3. Smooth left apical septal thickening, suspicious for lymphangitic tumor spread. Upper Abdomen: Motion degradation continuing into the upper abdomen, moderate in severity. Grossly normal imaged liver, spleen, stomach, gallbladder, pancreas, adrenal glands, kidneys. Upper abdominal adenopathy, with a preaortic node  measuring 1.4 cm on 167/2. Musculoskeletal: Osseous destruction of the anterior left third rib on 55/2. IMPRESSION: 1. Left upper lobe/lingular primary bronchogenic carcinoma. 2. 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 3. Motion degraded exam. 4. Coronary artery  atherosclerosis. Aortic Atherosclerosis (ICD10-I70.0). 5. Small pericardial effusion. Electronically Signed   By: Abigail Miyamoto M.D.   On: 07/12/2019 09:13   NM PET Image Initial (PI) Skull Base To Thigh  Result Date: 07/22/2019 CLINICAL DATA:  Initial treatment strategy for metastatic left lung cancer. EXAM: NUCLEAR MEDICINE PET SKULL BASE TO THIGH TECHNIQUE: 12.3 mCi F-18 FDG was injected intravenously. Full-ring PET imaging was performed from the skull base to thigh after the radiotracer. CT data was obtained and used for attenuation correction and anatomic localization. Fasting blood glucose: 104 mg/dl COMPARISON:  CT chest dated 07/12/2019 FINDINGS: Mediastinal blood pool activity: SUV max 2.7 Liver activity: SUV max NA NECK: 9 mm short axis left lower cervical node (series 4/image 38), max SUV 5.5. Paretic left vocal cord. Associated mild hypermetabolism of the posterior right vocal fold (PET image 48), max SUV 7.4, favored to be physiologic. If clinically warranted given the additional findings, direct inspection could be considered. Incidental CT findings: none CHEST: Left upper lobe mass, compatible primary bronchogenic neoplasm and better evaluated on recent CT chest, max SUV 22.5. Direct invasion of the mediastinum. Associated moderate left pleural effusion with multifocal pleural-based metastases, including a dominant 2.0 x 5.8 cm lesion inferiorly (series 4/image 119), max SUV 14.6. Left lower lobe compressive atelectasis. Widespread thoracic metastases, including left supraclavicular, left subpectoral/axillary, left superior mediastinal/prevascular/IMA, right paratracheal, subcarinal, retrocrural, and right hilar nodal metastases. Index 2.5 cm short axis node at the left thoracic inlet, max SUV 27.1. Scattered pulmonary nodules in the right lung, including a dominant 12 mm right upper lobe nodule (series 8/image 21), max SUV 5.8, compatible with pulmonary metastases. Incidental CT findings:  Atherosclerotic calcifications of the aortic arch. Coronary atherosclerosis of the LAD. ABDOMEN/PELVIS: No abnormal hypermetabolism in the liver, spleen, pancreas, or adrenal glands. Retroperitoneal/para-aortic lymphadenopathy in the upper abdomen, including a 1.9 cm short axis left anterior para-aortic node (series 4/image 127), max SUV 2.2. Incidental CT findings: Atherosclerotic calcifications the abdominal aorta and branch vessels. Suspected postsurgical changes related to prior TURP. SKELETON: 1.9 x 4.0 cm metastasis involving the left anterior 3rd rib (series 4/image 67), max SUV 25.6. Incidental CT findings: Degenerative changes of the lumbar spine. IMPRESSION: Left upper lobe mass, corresponding to primary bronchogenic neoplasm. Moderate left pleural effusion with pleural-based metastases, as above. Contralateral pulmonary metastases on the right. Widespread thoracic nodal metastases, as above. Associated mild upper abdominal/retroperitoneal nodal metastases. Mild left lower cervical nodal metastasis. Osseous metastasis involving the left anterior 3rd rib. 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 If clinically warranted given the additional findings, direct inspection could be considered. Electronically Signed   By: Julian Hy M.D.   On: 07/22/2019 14:44    Labs:  CBC: Recent Labs    07/02/19 0938 07/24/19 0730  WBC 9.6 10.0  HGB 15.1 15.0  HCT 43.9 45.1  PLT 326 377    COAGS: Recent Labs    07/24/19 0730  INR 1.1    BMP: Recent Labs    03/12/19 1041 07/02/19 0938  NA 143 139  K 4.9 4.6  CL 101 94*  CO2 29  31*  GLUCOSE 103* 115*  BUN 15 17  CALCIUM 9.5 10.1  CREATININE 1.13 0.92  GFRNONAA 70 89  GFRAA 81 103    LIVER FUNCTION TESTS: Recent Labs    03/12/19 1041 07/02/19 0938  BILITOT 0.7 0.5  AST 26 23  ALT 20 25  ALKPHOS 74 94  PROT 6.7 6.6  ALBUMIN 4.6 4.2      TUMOR MARKERS: No results for input(s): AFPTM, CEA, CA199, CHROMGRNA in the last 8760 hours.  Assessment and Plan: 64 y.o. male, ex-smoker, with history of hoarseness, productive cough, dyspnea, abdominal/back pain and recent PET scan revealing hypermetabolic left upper lobe lung mass with moderate left pleural effusion/pleural-based metastasis as well as contralateral pulmonary metastasis on the right, widespread thoracic nodal metastasis, associated mild upper abdominal /retroperitoneal nodal metastasis and mild left lower cervical nodal metastasis.  Also noted was osseous metastasis involving the left anterior third rib with focal hypermetabolism involving the right posterior vocal cord.  He presents today for image guided left third rib/pleural-based lesion biopsy and left thoracentesis for further evaluation. Risks and benefits of procedures was discussed with the patient  including, but not limited to bleeding, infection, damage to adjacent structures, pneumothorax,  or low yield requiring additional tests.  All of the questions were answered and there is agreement to proceed.  Consent signed and in chart.     Thank you for this interesting consult.  I greatly enjoyed meeting [name] and look forward to participating in their care.  A copy of this report was sent to the requesting provider on this date.  Electronically Signed: D. Rowe Robert, PA-C 07/24/2019, 8:24 AM   I spent a total of 25 minutes   in face to face in clinical consultation, greater than 50% of which was counseling/coordinating care for image guided left third rib lesion/pleural mass biopsy and left thoracentesis

## 2019-07-24 NOTE — Progress Notes (Signed)
Lennette Bihari, PA aware of covid + result ; no further orders received

## 2019-07-24 NOTE — Progress Notes (Signed)
Patient ID: [name], male   DOB: 31-May-1956, 64 y.o.   MRN: [id number] Patient, patient's spouse and oncologist made aware today of patient's Covid positive status. Per Dr. Delton Coombes order received to proceed with left thoracentesis today. Approximately 1.3 L of left pleural fluid was removed today using appropriate PPE protocol. CXR pend.  Patient is scheduled to follow-up with Dr. Delton Coombes on 2/1. Pt will be discharged home today (as long as CXR without any complications) with instructions to return to ED should he have any worsening symptoms or significant fever.

## 2019-07-24 NOTE — Discharge Instructions (Signed)
FOR ANY QUESTIONS OR CONCERNS CALL [phone]; for after hours call [phone] and ask for on call MD   Thoracentesis, Care After This sheet gives you information about how to care for yourself after your procedure. Your health care provider may also give you more specific instructions. If you have problems or questions, contact your health care provider. What can I expect after the procedure? After your procedure, it is common to have some pain at the site where the needle was inserted (puncture site). Follow these instructions at home:  Care of the puncture site  Follow instructions from your health care provider about how to take care of your puncture site. Make sure you: ? Wash your hands with soap and water before you change your bandage (dressing). If soap and water are not available, use hand sanitizer. ? Change your dressing as told by your health care provider.  Check the puncture site every day for signs of infection. Check for: ? Redness, swelling, or pain. ? Fluid or blood. ? Warmth. ? Pus or a bad smell.  Do not take baths, swim, or use a hot tub until your health care provider approves. General instructions  Take over-the-counter and prescription medicines only as told by your health care provider.  Do not drive for 24 hours if you were given a medicine to help you relax (sedative) during your procedure.  Drink enough fluid to keep your urine pale yellow.  You may return to your normal diet and normal activities as told by your health care provider.  Keep all follow-up visits as told by your health care provider. This is important. Contact a health care provider if you:  Have redness, swelling, or pain at your puncture site.  Have fluid or blood coming from your puncture site.  Notice that your puncture site feels warm to the touch.  Have pus or a bad smell coming from your puncture site.  Have a fever.  Have chills.  Have nausea or vomiting.  Have  trouble breathing.  Develop a worsening cough. Get help right away if you:  Have extreme shortness of breath.  Develop chest pain.  Faint or feel light-headed. Summary  After your procedure, it is common to have some pain at the site where the needle was inserted (puncture site).  Wash your hands with soap and water before you change your bandage (dressing).  Check your puncture site every day for signs of infection.  Take over-the-counter and prescription medicines only as told by your health care provider. This information is not intended to replace advice given to you by your health care provider. Make sure you discuss any questions you have with your health care provider. Document Revised: 05/26/2017 Document Reviewed: 05/08/2017 Elsevier Patient Education  [place]. Needle Biopsy, Care After These instructions tell you how to care for yourself after your procedure. Your doctor may also give you more specific instructions. Call your doctor if you have any problems or questions. What can I expect after the procedure? After the procedure, it is common to have:  Soreness.  Bruising.  Mild pain. Follow these instructions at home:   Return to your normal activities as told by your doctor. Ask your doctor what activities are safe for you.  Take over-the-counter and prescription medicines only as told by your doctor.  Wash your hands with soap and water before you change your bandage (dressing). If you cannot use soap and water, use hand sanitizer.  Follow instructions from your doctor  about: ? How to take care of your puncture site. ? When and how to change your bandage. ? When to remove your bandage.  Check your puncture site every day for signs of infection. Watch for: ? Redness, swelling, or pain. ? Fluid or blood. ? Pus or a bad smell. ? Warmth.  Do not take baths, swim, or use a hot tub until your doctor approves. Ask your doctor if you may take  showers. You may only be allowed to take sponge baths.  Keep all follow-up visits as told by your doctor. This is important. Contact a doctor if you have:  A fever.  Redness, swelling, or pain at the puncture site, and it lasts longer than a few days.  Fluid, blood, or pus coming from the puncture site.  Warmth coming from the puncture site. Get help right away if:  You have a lot of bleeding from the puncture site. Summary  After the procedure, it is common to have soreness, bruising, or mild pain at the puncture site.  Check your puncture site every day for signs of infection, such as redness, swelling, or pain.  Get help right away if you have severe bleeding from your puncture site. This information is not intended to replace advice given to you by your health care provider. Make sure you discuss any questions you have with your health care provider. Document Revised: 06/26/2017 Document Reviewed: 06/26/2017 Elsevier Patient Education  [place]. Moderate Conscious Sedation, Adult, Care After These instructions provide you with information about caring for yourself after your procedure. Your health care provider may also give you more specific instructions. Your treatment has been planned according to current medical practices, but problems sometimes occur. Call your health care provider if you have any problems or questions after your procedure. What can I expect after the procedure? After your procedure, it is common:  To feel sleepy for several hours.  To feel clumsy and have poor balance for several hours.  To have poor judgment for several hours.  To vomit if you eat too soon. Follow these instructions at home: For at least 24 hours after the procedure:   Do not: ? Participate in activities where you could fall or become injured. ? Drive. ? Use heavy machinery. ? Drink alcohol. ? Take sleeping pills or medicines that cause drowsiness. ? Make important  decisions or sign legal documents. ? Take care of children on your own.  Rest. Eating and drinking  Follow the diet recommended by your health care provider.  If you vomit: ? Drink water, juice, or soup when you can drink without vomiting. ? Make sure you have little or no nausea before eating solid foods. General instructions  Have a responsible adult stay with you until you are awake and alert.  Take over-the-counter and prescription medicines only as told by your health care provider.  If you smoke, do not smoke without supervision.  Keep all follow-up visits as told by your health care provider. This is important. Contact a health care provider if:  You keep feeling nauseous or you keep vomiting.  You feel light-headed.  You develop a rash.  You have a fever. Get help right away if:  You have trouble breathing. This information is not intended to replace advice given to you by your health care provider. Make sure you discuss any questions you have with your health care provider. Document Revised: 05/26/2017 Document Reviewed: 10/03/2015 Elsevier Patient Education  2020 [employer].

## 2019-07-24 NOTE — Procedures (Signed)
Interventional Radiology Procedure Note  Procedure: CT guided biopsy of left rib lesion.  Mx 18g core biopsy.   Complications: None  Recommendations:  - advance diet - pending COVID test, plan for left thora after complete - Do not submerge for 7 days - Routine care   Signed,  Dulcy Fanny. Earleen Newport, DO

## 2019-07-24 NOTE — Procedures (Addendum)
Ultrasound-guided diagnostic and therapeutic left thoracentesis performed yielding 1.3 liters of blood-tinged fluid. No immediate complications. Follow-up chest x-ray pending. The fluid was sent to the lab for preordered studies. EBL none.  Due to this being patient's initial thoracentesis only the above amount of fluid was removed today.

## 2019-07-24 NOTE — Progress Notes (Signed)
Per kevin, PA ok to discharge patient at this time

## 2019-07-25 ENCOUNTER — Telehealth: Payer: Self-pay | Admitting: Physician Assistant

## 2019-07-25 ENCOUNTER — Other Ambulatory Visit: Payer: Self-pay

## 2019-07-25 ENCOUNTER — Ambulatory Visit (INDEPENDENT_AMBULATORY_CARE_PROVIDER_SITE_OTHER): Payer: BC Managed Care – PPO | Admitting: Family Medicine

## 2019-07-25 DIAGNOSIS — U071 COVID-19: Secondary | ICD-10-CM

## 2019-07-25 DIAGNOSIS — C3492 Malignant neoplasm of unspecified part of left bronchus or lung: Secondary | ICD-10-CM | POA: Diagnosis not present

## 2019-07-25 DIAGNOSIS — I10 Essential (primary) hypertension: Secondary | ICD-10-CM

## 2019-07-25 NOTE — Telephone Encounter (Signed)
Called to discuss with [name] about Covid symptoms and the use of bamlanivimab, a monoclonal antibody infusion for those with mild to moderate Covid symptoms and at a high risk of hospitalization.    [name] is undergoing work up for metastatic lung cancer. Exposed to grand daughter who had COVID on 20th however going to multiple visit of cancer workup.   [name] has baseline SOB and cough with hoarsenes  for > 2 weeks that lead cancer evaluation.   Pt does not qualify for infusion therapy as [name] has asymptomatic infection. Isolation precautions discussed. Advised to contact back for consideration should [name] develop symptoms. Patient verbalized understanding.      Patient Active Problem List   Diagnosis Date Noted  . Mass of upper lobe of left lung 07/15/2019  . Essential hypertension, benign 07/12/2013  . Other and unspecified hyperlipidemia 07/12/2013  . Plantar fasciitis of left foot 07/12/2013     Leanor Kail, PA

## 2019-07-25 NOTE — Progress Notes (Signed)
   Subjective:  Audio  Patient ID: [name], male    DOB: Nov 08, 1955, 64 y.o.   MRN: [id number]  HPIpt had covid test for procedure and it came back positive. Pt states the symptoms he has have been going on for awhile. Sob with activity, coughing up mucus, no fever.  Virtual Visit via Telephone Note  I connected with [name] on 07/25/19 at  9:30 AM EST by telephone and verified that I am speaking with the correct person using two identifiers.  Location: Patient: home Provider: office   I discussed the limitations, risks, security and privacy concerns of performing an evaluation and management service by telephone and the availability of in person appointments. I also discussed with the patient that there may be a patient responsible charge related to this service. The patient expressed understanding and agreed to proceed.   History of Present Illness:    Observations/Objective:   Assessment and Plan:   Follow Up Instructions:    I discussed the assessment and treatment plan with the patient. The patient was provided an opportunity to ask questions and all were answered. The patient agreed with the plan and demonstrated an understanding of the instructions.   The patient was advised to call back or seek an in-person evaluation if the symptoms worsen or if the condition fails to improve as anticipated.  I provided 30 minutes of non-face-to-face time during this encounter.   Patient presents rather complicated presentation.  He has had an element of cough building for the last 2 months plus.  A scan 2 weeks ago revealed unfortunately metastatic lung cancer.  He is now in the midst of work-up.  Yesterday in anticipation of biopsy and thoracocentesis patient had a rapid test.  This returned positive.  Patient has not been a good year for over 2 weeks.  Patient feels he may have little more congestion and a little more cough and a little more shortness of  breath this week really not sure whether it is related to his chronic problem or anything you  On further history was around his grandkids last week.  They are now quarantine with known confirmed positive Covid test.    Review of Systems No current headache no fevers some mild weakness some soreness at site of thoracentesis    Objective:   Physical Exam  Virtual      Assessment & Plan:  Impression COVID-19 positive patient with numerous risk factors.  I think the most likely source is acute exposure last week via his children.  I am very concerned about this patient with his multiple risk factors.  I am also hopeful we can move forward with aggressive therapy so as not to delay his lung cancer interventions.  Long discussion held with patient this regard.  We have reached out to the monoclonal antibody folks.  I hope they will agree he is at high risk, and that is likely exposure was last week through his grandkids, and that is symptoms this week or at least an element from COVID-19.  This would likely qualify him for monoclonal antibody therapy which would improve our chances of keeping him out of the hospital and proceeding with his cancer therapy which is paramount

## 2019-07-26 ENCOUNTER — Encounter: Payer: Self-pay | Admitting: Family Medicine

## 2019-07-26 ENCOUNTER — Ambulatory Visit (HOSPITAL_COMMUNITY): Payer: BC Managed Care – PPO

## 2019-07-29 ENCOUNTER — Ambulatory Visit (HOSPITAL_COMMUNITY): Payer: BC Managed Care – PPO | Admitting: Hematology

## 2019-07-29 LAB — CULTURE, BODY FLUID W GRAM STAIN -BOTTLE: Culture: NO GROWTH

## 2019-07-29 LAB — CYTOLOGY - NON PAP

## 2019-07-29 LAB — SURGICAL PATHOLOGY

## 2019-08-01 ENCOUNTER — Encounter: Payer: Self-pay | Admitting: Family Medicine

## 2019-08-05 ENCOUNTER — Other Ambulatory Visit (HOSPITAL_COMMUNITY): Payer: Self-pay | Admitting: *Deleted

## 2019-08-05 ENCOUNTER — Encounter (HOSPITAL_COMMUNITY): Payer: Self-pay | Admitting: *Deleted

## 2019-08-05 MED ORDER — HYDROCODONE-ACETAMINOPHEN 5-325 MG PO TABS: 1.0000 | ORAL_TABLET | Freq: Three times a day (TID) | ORAL | 0 refills | Status: AC | PRN

## 2019-08-05 NOTE — Progress Notes (Signed)
Patient called the cancer center reporting new pain in his lower abdomen and his left side. He reports that it hurts worse when he is sitting up. He gets relief when he lays on that side.  He reports the pain is 7-8/10 at its worst but it does wax and wane.    Dr. Delton Coombes gave orders for hydrocodone 5/325mg  one tablet by mouth every 8 hours prn pain.   I have given the instructions to the patient and he verbalizes understanding.    Prescription sent to Dr. Delton Coombes to sign.

## 2019-08-07 ENCOUNTER — Encounter (HOSPITAL_COMMUNITY): Payer: Self-pay | Admitting: *Deleted

## 2019-08-07 ENCOUNTER — Observation Stay (HOSPITAL_COMMUNITY)
Admission: EM | Admit: 2019-08-07 | Discharge: 2019-08-08 | Disposition: A | Discharge status: Home / Self Care | Payer: BC Managed Care – PPO | Attending: Internal Medicine | Admitting: Internal Medicine

## 2019-08-07 ENCOUNTER — Emergency Department (HOSPITAL_COMMUNITY): Payer: BC Managed Care – PPO

## 2019-08-07 ENCOUNTER — Other Ambulatory Visit: Payer: Self-pay

## 2019-08-07 DIAGNOSIS — I10 Essential (primary) hypertension: Secondary | ICD-10-CM | POA: Diagnosis present

## 2019-08-07 DIAGNOSIS — C3412 Malignant neoplasm of upper lobe, left bronchus or lung: Secondary | ICD-10-CM | POA: Diagnosis not present

## 2019-08-07 DIAGNOSIS — J910 Malignant pleural effusion: Secondary | ICD-10-CM | POA: Insufficient documentation

## 2019-08-07 DIAGNOSIS — E785 Hyperlipidemia, unspecified: Secondary | ICD-10-CM | POA: Diagnosis not present

## 2019-08-07 DIAGNOSIS — R918 Other nonspecific abnormal finding of lung field: Secondary | ICD-10-CM | POA: Diagnosis present

## 2019-08-07 DIAGNOSIS — C7801 Secondary malignant neoplasm of right lung: Secondary | ICD-10-CM | POA: Diagnosis not present

## 2019-08-07 DIAGNOSIS — Z79899 Other long term (current) drug therapy: Secondary | ICD-10-CM | POA: Diagnosis not present

## 2019-08-07 DIAGNOSIS — Z9889 Other specified postprocedural states: Secondary | ICD-10-CM | POA: Diagnosis not present

## 2019-08-07 DIAGNOSIS — F17200 Nicotine dependence, unspecified, uncomplicated: Secondary | ICD-10-CM | POA: Diagnosis not present

## 2019-08-07 DIAGNOSIS — U071 COVID-19: Secondary | ICD-10-CM | POA: Diagnosis not present

## 2019-08-07 DIAGNOSIS — Z7982 Long term (current) use of aspirin: Secondary | ICD-10-CM | POA: Diagnosis not present

## 2019-08-07 DIAGNOSIS — K5900 Constipation, unspecified: Secondary | ICD-10-CM | POA: Diagnosis not present

## 2019-08-07 DIAGNOSIS — C778 Secondary and unspecified malignant neoplasm of lymph nodes of multiple regions: Secondary | ICD-10-CM | POA: Diagnosis not present

## 2019-08-07 DIAGNOSIS — J90 Pleural effusion, not elsewhere classified: Secondary | ICD-10-CM | POA: Diagnosis present

## 2019-08-07 DIAGNOSIS — C7951 Secondary malignant neoplasm of bone: Secondary | ICD-10-CM | POA: Diagnosis not present

## 2019-08-07 DIAGNOSIS — R000 Tachycardia, unspecified: Secondary | ICD-10-CM | POA: Diagnosis not present

## 2019-08-07 LAB — CBC WITH DIFFERENTIAL/PLATELET
Abs Immature Granulocytes: 0.04 10*3/uL (ref 0.00–0.07)
Basophils Absolute: 0 10*3/uL (ref 0.0–0.1)
Basophils Relative: 0 %
Eosinophils Absolute: 0 10*3/uL (ref 0.0–0.5)
Eosinophils Relative: 0 %
HCT: 47.7 % (ref 39.0–52.0)
Hemoglobin: 15.4 g/dL (ref 13.0–17.0)
Immature Granulocytes: 0 %
Lymphocytes Relative: 8 %
Lymphs Abs: 0.9 10*3/uL (ref 0.7–4.0)
MCH: 29.8 pg (ref 26.0–34.0)
MCHC: 32.3 g/dL (ref 30.0–36.0)
MCV: 92.3 fL (ref 80.0–100.0)
Monocytes Absolute: 0.9 10*3/uL (ref 0.1–1.0)
Monocytes Relative: 8 %
Neutro Abs: 9.4 10*3/uL — ABNORMAL HIGH (ref 1.7–7.7)
Neutrophils Relative %: 84 %
Platelets: 359 10*3/uL (ref 150–400)
RBC: 5.17 MIL/uL (ref 4.22–5.81)
RDW: 13.3 % (ref 11.5–15.5)
WBC: 11.2 10*3/uL — ABNORMAL HIGH (ref 4.0–10.5)
nRBC: 0 % (ref 0.0–0.2)

## 2019-08-07 LAB — BRAIN NATRIURETIC PEPTIDE: B Natriuretic Peptide: 129 pg/mL — ABNORMAL HIGH (ref 0.0–100.0)

## 2019-08-07 LAB — COMPREHENSIVE METABOLIC PANEL
ALT: 53 U/L — ABNORMAL HIGH (ref 0–44)
AST: 41 U/L (ref 15–41)
Albumin: 3.5 g/dL (ref 3.5–5.0)
Alkaline Phosphatase: 88 U/L (ref 38–126)
Anion gap: 16 — ABNORMAL HIGH (ref 5–15)
BUN: 19 mg/dL (ref 8–23)
CO2: 30 mmol/L (ref 22–32)
Calcium: 12 mg/dL — ABNORMAL HIGH (ref 8.9–10.3)
Chloride: 91 mmol/L — ABNORMAL LOW (ref 98–111)
Creatinine, Ser: 0.96 mg/dL (ref 0.61–1.24)
GFR calc Af Amer: >60 mL/min (ref >60)
GFR calc non Af Amer: >60 mL/min (ref >60)
Glucose, Bld: 125 mg/dL — ABNORMAL HIGH (ref 70–99)
Potassium: 4.1 mmol/L (ref 3.5–5.1)
Sodium: 137 mmol/L (ref 135–145)
Total Bilirubin: 1.3 mg/dL — ABNORMAL HIGH (ref 0.3–1.2)
Total Protein: 7.4 g/dL (ref 6.5–8.1)

## 2019-08-07 MED ORDER — MORPHINE SULFATE (PF) 2 MG/ML IV SOLN
2.0000 mg | Freq: Once | INTRAVENOUS | Status: AC
  Administered 2019-08-07: 2 mg via INTRAVENOUS
  Filled 2019-08-07: qty 1

## 2019-08-07 NOTE — ED Triage Notes (Signed)
Pt c/o increased sob over the last few days; pt has lung cancer and called his cancer doctor and was told to come here; pt had a thoracentesis on 1/27; wife states pt is unable to walk anywhere without becoming sob

## 2019-08-07 NOTE — H&P (Addendum)
History and Physical    [name] YWV:[id number] DOB: 05/15/1956 DOA: 08/07/2019  PCP: Mikey Kirschner, MD   Patient coming from: Home  I have personally briefly reviewed patient's old medical records in [place]  Chief Complaint: Difficulty breathing.  HPI: [name] is a 64 y.o. male with medical history significant for new left lung mass, hypertension.  Patient presented to the ED with complaints of 3 days of difficulty breathing, worse with exertion improved with rest and with lying on his left side.  Patient had similar symptoms with improvement after thoracentesis 1/27.  But symptoms quickly recurred.  Patient tested positive for Covid 07/24/19, so far he has been asymptomatic.  Patient was evaluated by his primary care provider for consideration for outpatient monoclonal antibody considering his high risk for more severe disease, but he was deemed not to be a candidate because he was asymptomatic.  He has a mild chronic cough, reductive of phlegm, that is unchanged.  No chest pain.  No myalgias or flulike symptoms.  ED Course: Tachycardic to 130, tachypneic,- 22, O2 sat 99% on room air.  Chest x-ray shows complete opacification of the left hemithorax, with rightward mediastinal shift.  BNP 129.  Hospitalist admit for further evaluation and management.  Review of Systems: As per HPI all other systems reviewed and negative.  Past Medical History:  Diagnosis Date  . ED (erectile dysfunction)   . Hyperlipidemia   . Hypertension     Past Surgical History:  Procedure Laterality Date  . APPENDECTOMY    . COLONOSCOPY       reports that he has been smoking. He has never used smokeless tobacco. No history on file for alcohol and drug.  No Known Allergies  No family history of lung cancer.  Prior to Admission medications   Medication Sig Start Date End Date Taking? Authorizing Provider  Ascorbic Acid (VITAMIN C PO) Take by mouth.    [provider]  aspirin EC 81 MG tablet Take 81 mg by mouth daily.    [provider]  hydrochlorothiazide (HYDRODIURIL) 25 MG tablet One tablet daily in morning 03/11/19   Mikey Kirschner, MD  HYDROcodone-acetaminophen (NORCO/VICODIN) 5-325 MG tablet Take 1 tablet by mouth every 8 (eight) hours as needed for moderate pain. 08/05/19   Derek Jack, MD  lisinopril (ZESTRIL) 40 MG tablet Take 1 tablet (40 mg total) by mouth daily. 03/11/19   Mikey Kirschner, MD  Multiple Vitamin (MULTIVITAMIN) tablet Take 1 tablet by mouth daily.    [provider]  pravastatin (PRAVACHOL) 20 MG tablet TAKE 1 TABLET (20 MG TOTAL) BY MOUTH DAILY. 03/11/19   Mikey Kirschner, MD  TRAVATAN Z 0.004 % SOLN ophthalmic solution INSTILL 1 DROP TO BOTH EYES AT BEDTIME 10/05/15   [provider]    Physical Exam: Vitals:   08/07/19 1929 08/07/19 1930  BP: 128/88   Pulse: (!) 130   Resp: (!) 22   Temp: 97.7 F (36.5 C)   TempSrc: Oral   SpO2: 99%   Weight:  95.3 kg  Height:  6\' 1"  (1.854 m)    Constitutional: NAD, calm, comfortable Vitals:   08/07/19 1929 08/07/19 1930  BP: 128/88   Pulse: (!) 130   Resp: (!) 22   Temp: 97.7 F (36.5 C)   TempSrc: Oral   SpO2: 99%   Weight:  95.3 kg  Height:  6\' 1"  (1.854 m)   Eyes: PERRL, lids and conjunctivae normal  ENMT: Mucous membranes are moist. Posterior pharynx clear of any exudate or lesions.Normal dentition.  Neck: normal, supple, no masses, no thyromegaly Respiratory:  Normal respiratory effort. No accessory muscle use.  Cardiovascular: Tachycardic, regular rate and rhythm,  No extremity edema. 2+ pedal pulses.  Abdomen: no tenderness, no masses palpated. No hepatosplenomegaly. Bowel sounds positive.  Musculoskeletal: no clubbing / cyanosis. No joint deformity upper and lower extremities. Good ROM, no contractures.  Skin: no rashes, lesions, ulcers. No induration Neurologic: No gross cranial nerve abnormality, moving all  extremities spontaneously. Psychiatric: Normal judgment and insight. Alert and oriented x 3. Normal mood.   Labs on Admission: I have personally reviewed following labs and imaging studies  CBC: Recent Labs  Lab 08/07/19 2006  WBC 11.2*  NEUTROABS 9.4*  HGB 15.4  HCT 47.7  MCV 92.3  PLT 359    Radiological Exams on Admission: DG Chest Port 1 View  Result Date: 08/07/2019 CLINICAL DATA:  The patient states increasing SOB for a few days. Patient has lung cancer and had a thoracentesis on 07/24/2019. Patient positive for Covid-19 on 07/24/2019. Hx of HTN and current smoker. EXAM: PORTABLE CHEST 1 VIEW COMPARISON:  Chest radiograph 07/23/2018 FINDINGS: Visualized portions of the cardiomediastinal contours appear stable. Redemonstrated diffuse opacification of the left hemithorax with rightward mediastinal shift. The right lung appears clear. No right pleural effusion. No large pneumothorax. No acute finding in the visualized skeleton. IMPRESSION: Redemonstrated diffuse opacification of the left hemithorax with associated rightward mediastinal shift. Electronically Signed   By: Audie Pinto M.D.   On: 08/07/2019 20:31    EKG: Unable to view on epic.  Assessment/Plan Principal Problem:   Recurrent left pleural effusion Active Problems:   Essential hypertension, benign   Mass of upper lobe of left lung   COVID-19 virus infection  Recurrent left pleural effusion- dyspneic with O2 sats currently 99% on room air.  Portable chest x-ray diffuse opacification of the left hemithorax with rightward mediastinal shift.  Recent ultrasound-guided thoracentesis 1/27 yielded 1.3 L of pleural fluid. -Ultrasound-guided thoracentesis in a.m. -Supplemental O2 as needed - ?pleurodesis is a consideration -Hold aspirin for thoracentesis -Unable to review surgical pathology results from thoracentesis from 07/24/2019  Left upper lobe bronchogenic carcinoma-seen on chest x-ray and subsequent CT  07/02/2019.  Evaluated by oncology, Dr. Delton Coombes 07/15/2019, plans for core biopsy and further testing.  MRI Brain scheduled.  -Pending complete work-up of his lung cancer -Pending surgical pathology results 1/28. -Resume home narcotics  Asymptomatic COVID-19 infection- diagnosed 07/23/18.  When he was to get his biopsy done.  Biopsy was done 1/28. So far has remained asymptomatic from Covid standpoint.  Was not a candidate for outpatient monoclonal antibody therapy. -Held off on steroids as he is almost 14 days out of his positive test. -Airborne and contact precautions, COVID-19 admission protocol  Hypertension-stable. -Resume home lisinopril 40 mg, HCTZ 25   DVT prophylaxis: SCDs Code Status: Full code Family Communication: None at bedside Disposition Plan: 1 to 2 days Consults called: Oncology Admission status: Observation, telemetry  Bethena Roys MD [facility]  08/07/2019, 9:36 PM

## 2019-08-07 NOTE — ED Provider Notes (Signed)
[facility] EMERGENCY DEPARTMENT Provider Note   CSN: [id number] Arrival date & time: 08/07/19  1918     History Chief Complaint  Patient presents with  . Shortness of Breath    [name] is a 64 y.o. male.  64 year old male with past medical history of lung cancer, recently diagnosed, presents with complaint of shortness of breath for 2 to 3 days.  Patient last had a thoracentesis on January 27, had 1 L removed.  Patient states that he was short of breath prior to this procedure, procedure helped and he was feeling better until 2 to 3 days ago and now feels significantly worse.  Symptoms are significantly worse with exertion, improved somewhat with lying on his left side.  Patient also reports left-sided abdominal pain and constipation which she attributes to not eating much lately.  Patient has spoken with his doctor about the pain and is taking pain medication as prescribed.  Patient denies fevers, chills, chest pain, nausea, vomiting.  Patient did test positive for Covid just before his thoracentesis, was asymptomatic.  No other complaints or concerns.        Past Medical History:  Diagnosis Date  . ED (erectile dysfunction)   . Hyperlipidemia   . Hypertension     Patient Active Problem List   Diagnosis Date Noted  . Mass of upper lobe of left lung 07/15/2019  . Essential hypertension, benign 07/12/2013  . Other and unspecified hyperlipidemia 07/12/2013  . Plantar fasciitis of left foot 07/12/2013    Past Surgical History:  Procedure Laterality Date  . APPENDECTOMY    . COLONOSCOPY         History reviewed. No pertinent family history.  Social History   Tobacco Use  . Smoking status: Current Some Day Smoker  . Smokeless tobacco: Never Used  Substance Use Topics  . Alcohol use: Not on file  . Drug use: Not on file    Home Medications Prior to Admission medications   Medication Sig Start Date End Date Taking? Authorizing Provider  Ascorbic Acid  (VITAMIN C PO) Take by mouth.    [provider]  aspirin EC 81 MG tablet Take 81 mg by mouth daily.    [provider]  hydrochlorothiazide (HYDRODIURIL) 25 MG tablet One tablet daily in morning 03/11/19   Mikey Kirschner, MD  HYDROcodone-acetaminophen (NORCO/VICODIN) 5-325 MG tablet Take 1 tablet by mouth every 8 (eight) hours as needed for moderate pain. 08/05/19   Derek Jack, MD  lisinopril (ZESTRIL) 40 MG tablet Take 1 tablet (40 mg total) by mouth daily. 03/11/19   Mikey Kirschner, MD  Multiple Vitamin (MULTIVITAMIN) tablet Take 1 tablet by mouth daily.    [provider]  pravastatin (PRAVACHOL) 20 MG tablet TAKE 1 TABLET (20 MG TOTAL) BY MOUTH DAILY. 03/11/19   Mikey Kirschner, MD  TRAVATAN Z 0.004 % SOLN ophthalmic solution INSTILL 1 DROP TO BOTH EYES AT BEDTIME 10/05/15   [provider]    Allergies    Patient has no known allergies.  Review of Systems   Review of Systems  Constitutional: Negative for chills, diaphoresis and fever.  Respiratory: Positive for shortness of breath. Negative for cough.   Cardiovascular: Negative for chest pain.  Gastrointestinal: Positive for abdominal pain and constipation. Negative for diarrhea, nausea and vomiting.  Skin: Negative for rash and wound.  Neurological: Negative for weakness.  Psychiatric/Behavioral: Negative for confusion.  All other systems reviewed and are negative.   Physical Exam  Updated Vital Signs BP 128/88 (BP Location: Right Arm)   Pulse (!) 130   Temp 97.7 F (36.5 C) (Oral)   Resp (!) 22   Ht 6\' 1"  (1.854 m)   Wt 95.3 kg   SpO2 99%   BMI 27.71 kg/m   Physical Exam Vitals and nursing note reviewed.  Constitutional:      General: He is not in acute distress.    Appearance: He is well-developed. He is not diaphoretic.  HENT:     Head: Normocephalic and atraumatic.  Neck:     Vascular: Hepatojugular reflux present.  Cardiovascular:     Rate and Rhythm: Regular  rhythm. Tachycardia present.     Pulses: Normal pulses.  Pulmonary:     Effort: Tachypnea present. No accessory muscle usage.     Breath sounds: Examination of the left-upper field reveals decreased breath sounds. Examination of the left-middle field reveals decreased breath sounds. Examination of the left-lower field reveals decreased breath sounds. Decreased breath sounds present. No wheezing, rhonchi or rales.  Abdominal:     Palpations: Abdomen is soft.     Tenderness: There is no abdominal tenderness.  Musculoskeletal:     Right lower leg: No edema.     Left lower leg: No edema.  Skin:    General: Skin is warm and dry.     Findings: No rash.  Neurological:     Mental Status: He is alert and oriented to person, place, and time.  Psychiatric:        Behavior: Behavior normal.     ED Results / Procedures / Treatments   Labs (all labs ordered are listed, but only abnormal results are displayed) Labs Reviewed  CBC WITH DIFFERENTIAL/PLATELET - Abnormal; Notable for the following components:      Result Value   WBC 11.2 (*)    Neutro Abs 9.4 (*)    All other components within normal limits  COMPREHENSIVE METABOLIC PANEL - Abnormal; Notable for the following components:   Chloride 91 (*)    Glucose, Bld 125 (*)    Calcium 12.0 (*)    ALT 53 (*)    Total Bilirubin 1.3 (*)    Anion gap 16 (*)    All other components within normal limits  BRAIN NATRIURETIC PEPTIDE    EKG EKG Interpretation  Date/Time:  Wednesday August 07 2019 19:44:11 EST Ventricular Rate:  127 PR Interval:    QRS Duration: 72 QT Interval:  272 QTC Calculation: 394 R Axis:   61 Text Interpretation: Sinus tachycardia Low voltage, precordial leads RSR' in V1 or V2, probably normal variant Nonspecific T abnormalities, lateral leads Confirmed by Nat Christen [phone]) on 08/07/2019 8:37:31 PM   Radiology DG Chest Port 1 View  Result Date: 08/07/2019 CLINICAL DATA:  The patient states increasing SOB for a  few days. Patient has lung cancer and had a thoracentesis on 07/24/2019. Patient positive for Covid-19 on 07/24/2019. Hx of HTN and current smoker. EXAM: PORTABLE CHEST 1 VIEW COMPARISON:  Chest radiograph 07/23/2018 FINDINGS: Visualized portions of the cardiomediastinal contours appear stable. Redemonstrated diffuse opacification of the left hemithorax with rightward mediastinal shift. The right lung appears clear. No right pleural effusion. No large pneumothorax. No acute finding in the visualized skeleton. IMPRESSION: Redemonstrated diffuse opacification of the left hemithorax with associated rightward mediastinal shift. Electronically Signed   By: Audie Pinto M.D.   On: 08/07/2019 20:31    Procedures Procedures (including critical care time)  Medications Ordered in ED Medications -  No data to display  ED Course  I have reviewed the triage vital signs and the nursing notes.  Pertinent labs & imaging results that were available during my care of the patient were reviewed by me and considered in my medical decision making (see chart for details).  Clinical Course as of Aug 06 2108  Wed Aug 06, 3638  [id number] 64 year old male with recent diagnosis of lung cancer presents with shortness of breath for 2 to 3 days without fever or chest pain.  Patient states he had shortness of breath prior to thoracentesis on January 27 where he had 1 L of fluid removed and then felt better.  Patient is feeling significantly worse since that episode at this time.  On exam patient is laying on his left side, tachycardic with a heart rate of 130, tachypneic with respiratory rate in the 30s on 2 L nasal cannula satting at 99%.  Loss of lung sounds on the left side on exam also JVD noted.  Chest x-ray shows redemonstrated diffuse opacification of the left hemithorax with associated rightward mediastinal shift. Case discussed with Dr. Lacinda Axon, ER attending, recommends consult to hospitalist service.  Patient did have a  positive Covid swab on January 27, was asymptomatic.   [LM]  2108 Case discussed with Dr. Denton Brick with hospitalist service who will consult for admission.    [LM]    Clinical Course User Index [LM] Roque Lias   MDM Rules/Calculators/A&P                      Final Clinical Impression(s) / ED Diagnoses Final diagnoses:  Pleural effusion on left    Rx / DC Orders ED Discharge Orders    None       Roque Lias 08/07/19 2110    Nat Christen, MD 08/08/19 [phone]

## 2019-08-08 ENCOUNTER — Observation Stay (HOSPITAL_COMMUNITY): Payer: BC Managed Care – PPO

## 2019-08-08 ENCOUNTER — Other Ambulatory Visit: Payer: Self-pay

## 2019-08-08 DIAGNOSIS — J90 Pleural effusion, not elsewhere classified: Secondary | ICD-10-CM

## 2019-08-08 DIAGNOSIS — C3490 Malignant neoplasm of unspecified part of unspecified bronchus or lung: Secondary | ICD-10-CM | POA: Diagnosis not present

## 2019-08-08 LAB — HIV ANTIBODY (ROUTINE TESTING W REFLEX): HIV Screen 4th Generation wRfx: NONREACTIVE

## 2019-08-08 LAB — CBC WITH DIFFERENTIAL/PLATELET
Abs Immature Granulocytes: 0.03 10*3/uL (ref 0.00–0.07)
Basophils Absolute: 0 10*3/uL (ref 0.0–0.1)
Basophils Relative: 0 %
Eosinophils Absolute: 0 10*3/uL (ref 0.0–0.5)
Eosinophils Relative: 0 %
HCT: 42.5 % (ref 39.0–52.0)
Hemoglobin: 14 g/dL (ref 13.0–17.0)
Immature Granulocytes: 0 %
Lymphocytes Relative: 8 %
Lymphs Abs: 0.8 10*3/uL (ref 0.7–4.0)
MCH: 30.6 pg (ref 26.0–34.0)
MCHC: 32.9 g/dL (ref 30.0–36.0)
MCV: 93 fL (ref 80.0–100.0)
Monocytes Absolute: 1.1 10*3/uL — ABNORMAL HIGH (ref 0.1–1.0)
Monocytes Relative: 10 %
Neutro Abs: 8.6 10*3/uL — ABNORMAL HIGH (ref 1.7–7.7)
Neutrophils Relative %: 82 %
Platelets: 300 10*3/uL (ref 150–400)
RBC: 4.57 MIL/uL (ref 4.22–5.81)
RDW: 13.4 % (ref 11.5–15.5)
WBC: 10.6 10*3/uL — ABNORMAL HIGH (ref 4.0–10.5)
nRBC: 0 % (ref 0.0–0.2)

## 2019-08-08 LAB — ABO/RH: ABO/RH(D): O POS

## 2019-08-08 MED ORDER — PRAVASTATIN SODIUM 10 MG PO TABS: 20.0000 mg | ORAL_TABLET | Freq: Every evening | ORAL | Status: DC

## 2019-08-08 MED ORDER — LISINOPRIL 10 MG PO TABS: 40.0000 mg | ORAL_TABLET | Freq: Every evening | ORAL | Status: DC

## 2019-08-08 MED ORDER — ONDANSETRON HCL 4 MG/2ML IJ SOLN: 4.0000 mg | Freq: Four times a day (QID) | INTRAMUSCULAR | Status: DC | PRN

## 2019-08-08 MED ORDER — POLYETHYLENE GLYCOL 3350 17 G PO PACK: 17.0000 g | PACK | Freq: Every day | ORAL | Status: DC | PRN

## 2019-08-08 MED ORDER — ACETAMINOPHEN 325 MG PO TABS: 650.0000 mg | ORAL_TABLET | Freq: Four times a day (QID) | ORAL | Status: DC | PRN

## 2019-08-08 MED ORDER — ONDANSETRON HCL 4 MG PO TABS: 4.0000 mg | ORAL_TABLET | Freq: Four times a day (QID) | ORAL | Status: DC | PRN

## 2019-08-08 MED ORDER — HYDROCODONE-ACETAMINOPHEN 5-325 MG PO TABS
1.0000 | ORAL_TABLET | Freq: Three times a day (TID) | ORAL | Status: DC | PRN
  Administered 2019-08-08: 01:00:00 1 via ORAL
  Filled 2019-08-08: qty 1

## 2019-08-08 MED ORDER — HYDROCHLOROTHIAZIDE 25 MG PO TABS
25.0000 mg | ORAL_TABLET | Freq: Every morning | ORAL | Status: DC
  Administered 2019-08-08: 25 mg via ORAL
  Filled 2019-08-08: qty 1

## 2019-08-08 MED ORDER — ENSURE ENLIVE PO LIQD
237.0000 mL | Freq: Two times a day (BID) | ORAL | Status: DC
  Administered 2019-08-08 (×2): 237 mL via ORAL

## 2019-08-08 NOTE — Procedures (Addendum)
   US guided PORTABLE Right Thoracentesis  1.2 L blood tinged fluid obtained  Tolerated well CXR: No PTX

## 2019-08-08 NOTE — Progress Notes (Signed)
Initial Nutrition Assessment  DOCUMENTATION CODES:   Not applicable  INTERVENTION:  Continue Ensure Enlive po BID, each supplement provides 350 kcal and 20 grams of protein  Recommend obtaining new wt for patient, admit wt possibly stated   NUTRITION DIAGNOSIS:   Increased nutrient needs related to acute illness, cancer and cancer related treatments(asymptomatic Covid-19 infection; newly diagnosed left upper lobe bronchogenic carcinoma) as evidenced by estimated needs.   GOAL:   Patient will meet greater than or equal to 90% of their needs    MONITOR:   PO intake, Supplement acceptance, Labs, Weight trends, I & O's  REASON FOR ASSESSMENT:   Malnutrition Screening Tool    ASSESSMENT:  RD working remotely.  64 year old male with past medical history significant for new left lung mass, HTN, HLD, recently tested positive for Covid-19 on 1/27 and asymptomatic so far presented to ED with complaints of 3 days of difficulty breathing and reports similar symptoms with improvement after 1/27 thoracentesis. CXR showed complete opacification of the left hemithorax with rightward mediastinal shift. Patient admitted for recurrent left pleural effusion.  2/11 - thoracentesis 1.2 L 1/27 - thoracentesis 1.3 L  Per notes, left upper lobe bronchogenic carcinoma seen on CXR and subsequent CT on 1/05. Patient evaluated by oncology. Patient is s/p core biopsy, complete work-up and surgical pathology results from 1/28 are pending.   Unable to obtain nutrition history at this time, per location pt has not returned to inpatient room from radiology. Patient on heart healthy diet, no recorded meals for review at this time. Per medications, pt is provided Ensure supplement twice daily. RD will continue to monitor for adequacy of meals and supplements. Additional nutrition supplements to be provided as appropriate.   Current wt 210 lbs ? stated Per wt history, ~ 3 weeks ago patient 103.1 kg (226.82  lbs) on 1/18. Weights have been trending down over the past 5 months. On 03/11/19 pt wt 119.2 kg (262.24 lbs), 07/02/19 pt wt 106.1 kg (233.42 lbs), noted 35.42 lb (13.5%) from September to January which is severe for time frame. Given recent wt history and newly diagnosed lung mass highly suspect malnutrition, however unable to identify at this time.  Recommend weighing patient to confirm current weight.   Medications reviewed  Labs: Ca 12 (H), WBC 10.6 (H)   NUTRITION - FOCUSED PHYSICAL EXAM: Unable to complete at this time.    Diet Order:   Diet Order            Diet Heart Room service appropriate? Yes; Fluid consistency: Thin  Diet effective now              EDUCATION NEEDS:   No education needs have been identified at this time  Skin:  Skin Assessment: Reviewed RN Assessment  Last BM:  Unknown  Height:   Ht Readings from Last 1 Encounters:  08/07/19 6\' 1"  (1.854 m)    Weight:   Wt Readings from Last 1 Encounters:  08/07/19 95.3 kg    Ideal Body Weight:  83.6 kg  BMI:  Body mass index is 27.71 kg/m.  Estimated Nutritional Needs:   Kcal:  2200-2500 (MSJ x 1.2-1.4)  Protein:  110-125  Fluid:  >/= 2 L/day   Lajuan Lines, RD, LDN Clinical Nutrition Office [phone] After Hours/Weekend Pager: [phone]

## 2019-08-08 NOTE — Plan of Care (Signed)
  Problem: Education: Goal: Knowledge of General Education information will improve Description: Including pain rating scale, medication(s)/side effects and non-pharmacologic comfort measures 08/08/2019 1528 by Loralie Champagne, RN Outcome: Adequate for Discharge 08/08/2019 1453 by Loralie Champagne, RN Outcome: Progressing   Problem: Clinical Measurements: Goal: Diagnostic test results will improve 08/08/2019 1528 by Loralie Champagne, RN Outcome: Adequate for Discharge 08/08/2019 1453 by Loralie Champagne, RN Outcome: Progressing Goal: Respiratory complications will improve 08/08/2019 1528 by Loralie Champagne, RN Outcome: Adequate for Discharge 08/08/2019 1453 by Loralie Champagne, RN Outcome: Progressing Goal: Cardiovascular complication will be avoided 08/08/2019 1528 by Loralie Champagne, RN Outcome: Adequate for Discharge 08/08/2019 1453 by Loralie Champagne, RN Outcome: Progressing   Problem: Activity: Goal: Risk for activity intolerance will decrease 08/08/2019 1528 by Loralie Champagne, RN Outcome: Adequate for Discharge 08/08/2019 1453 by Loralie Champagne, RN Outcome: Progressing   Problem: Pain Managment: Goal: General experience of comfort will improve 08/08/2019 1528 by Loralie Champagne, RN Outcome: Adequate for Discharge 08/08/2019 1453 by Loralie Champagne, RN Outcome: Progressing

## 2019-08-08 NOTE — Plan of Care (Signed)
  Problem: Education: Goal: Knowledge of General Education information will improve Description: Including pain rating scale, medication(s)/side effects and non-pharmacologic comfort measures Outcome: Progressing   Problem: Clinical Measurements: Goal: Diagnostic test results will improve Outcome: Progressing Goal: Respiratory complications will improve Outcome: Progressing Goal: Cardiovascular complication will be avoided Outcome: Progressing   Problem: Activity: Goal: Risk for activity intolerance will decrease Outcome: Progressing   Problem: Pain Managment: Goal: General experience of comfort will improve Outcome: Progressing   

## 2019-08-08 NOTE — Plan of Care (Signed)
  Problem: Education: Goal: Knowledge of General Education information will improve Description Including pain rating scale, medication(s)/side effects and non-pharmacologic comfort measures Outcome: Progressing   

## 2019-08-08 NOTE — Discharge Summary (Signed)
Physician Discharge Summary  RAYNALD ROUILLARD QIH:[id number] DOB: 19-Sep-1955 DOA: 08/07/2019  PCP: Mikey Kirschner, MD  Admit date: 08/07/2019  Discharge date: 08/08/2019  Admitted From: Home  Disposition: Home  Recommendations for Outpatient Follow-up:  1. Follow up with PCP in 1-2 weeks 2. Continue usual home medications 3. Will ensure follow-up with Dr. Delton Coombes for further work-up of lung mass 4. We will asked that Dr. Genevive Bi follow-up with the patient to consider pleurodesis due to recurrent malignant pleural effusions requiring thoracentesis  Home Health: None  Equipment/Devices: None  Discharge Condition: Stable  CODE STATUS: Full  Diet recommendation: Heart Healthy  Brief/Interim Summary: Per HPI: STERLIN KNIGHTLY is a 64 y.o. male with medical history significant for new left lung mass, hypertension.  Patient presented to the ED with complaints of 3 days of difficulty breathing, worse with exertion improved with rest and with lying on his left side.  Patient had similar symptoms with improvement after thoracentesis 1/27.  But symptoms quickly recurred.  Patient tested positive for Covid 07/24/19, so far he has been asymptomatic.  Patient was evaluated by his primary care provider for consideration for outpatient monoclonal antibody considering his high risk for more severe disease, but he was deemed not to be a candidate because he was asymptomatic.  He has a mild chronic cough, reductive of phlegm, that is unchanged.  No chest pain.  No myalgias or flulike symptoms.  ED Course: Tachycardic to 130, tachypneic,- 22, O2 sat 99% on room air.  Chest x-ray shows complete opacification of the left hemithorax, with rightward mediastinal shift.  BNP 129.  Hospitalist admit for further evaluation and management.  2/11: Patient was admitted with recurrent left-sided pleural effusion with recent thoracentesis on 1/27.  He underwent ultrasound-guided thoracentesis this morning  with 1.2 L of fluid removed on the left side.  He is doing much better after the procedure and is now on room air with stable vital signs.  No pneumothorax noted on imaging after the thoracentesis was performed.  He is stable for discharge and will follow up with his oncologist Dr. Delton Coombes for complete work-up of his left upper lobe bronchogenic carcinoma.  I have also asked Dr. Genevive Bi with cardiothoracic surgery to follow-up with him as well for consideration of pleurodesis given his recurrent effusions necessitating thoracentesis.  No other acute events noted during this brief admission.  Discharge Diagnoses:  Principal Problem:   Recurrent left pleural effusion Active Problems:   Essential hypertension, benign   Mass of upper lobe of left lung   COVID-19 virus infection  Principal discharge diagnosis: Recurrent left-sided malignant pleural effusion.  Discharge Instructions  Discharge Instructions    Diet - low sodium heart healthy   Complete by: As directed    Increase activity slowly   Complete by: As directed      Allergies as of 08/08/2019   No Known Allergies     Medication List    TAKE these medications   ascorbic acid 500 MG tablet Commonly known as: VITAMIN C Take 500 mg by mouth every morning.   aspirin EC 81 MG tablet Take 81 mg by mouth every morning.   hydrochlorothiazide 25 MG tablet Commonly known as: HYDRODIURIL One tablet daily in morning What changed:   how much to take  how to take this  when to take this  additional instructions   HYDROcodone-acetaminophen 5-325 MG tablet Commonly known as: NORCO/VICODIN Take 1 tablet by mouth every 8 (eight) hours as needed for moderate  pain.   lisinopril 40 MG tablet Commonly known as: ZESTRIL Take 1 tablet (40 mg total) by mouth daily. What changed: when to take this   multivitamin tablet Take 1 tablet by mouth every morning.   pravastatin 20 MG tablet Commonly known as: PRAVACHOL TAKE 1 TABLET (20  MG TOTAL) BY MOUTH DAILY. What changed:   how much to take  how to take this  when to take this  additional instructions   Travatan Z 0.004 % Soln ophthalmic solution Generic drug: Travoprost (BAK Free) Place 1 drop into both eyes at bedtime.      Follow-up Information    Mikey Kirschner, MD Follow up in 1 week(s).   Specialty: Family Medicine Contact information: [place] [address] [phone]        Derek Jack, MD Follow up in 1 week(s).   Specialty: Hematology Contact information: [place] [address] [phone]        Nestor Lewandowsky, MD Follow up in 1 week(s).   Specialties: Cardiothoracic Surgery, General Surgery Contact information: [address] [place] [address] [phone]          No Known Allergies  Consultations:  None   Procedures/Studies: CT Chest W Contrast  Result Date: 07/12/2019 CLINICAL DATA:  Abnormal chest radiograph. Weight loss. Cough. Shortness of breath. Symptoms for 2.5 months. Hypertension. Ex-smoker, quitting in December. EXAM: CT CHEST WITH CONTRAST TECHNIQUE: Multidetector CT imaging of the chest was performed during intravenous contrast administration. CONTRAST:  15mL ISOVUE-300 IOPAMIDOL (ISOVUE-300) INJECTION 61% COMPARISON:  Chest radiograph 07/02/2019.  Abdominal CT 08/14/2004. FINDINGS: Cardiovascular: Mild motion degradation throughout. Bovine arch. Aortic and branch vessel atherosclerosis. Tortuous thoracic aorta. Normal heart size with small pericardial effusion. Left main and LAD coronary artery atherosclerosis. Left upper lobe pulmonary artery branch involvement by tumor. No central pulmonary embolism, on this non-dedicated study. Mediastinum/Nodes: 8 mm left supraclavicular node on 10/02. Left subpectoral adenopathy at 1.0 cm on 24/2. Extensive mediastinal adenopathy, including a high left mediastinal node of 2.7 cm on 36/2. Subcarinal node  measures 1.7 cm on 75/2. Right hilar adenopathy at 2.0 cm on 64/2. Mediastinal shift to the right, secondary to tumor burden on the left. Direct mediastinal extension in the AP window region on 58/2. Lungs/Pleura: Trace right and moderate left-sided pleural effusions. Left pleural implants, including posteriorly at 2.2 cm on 133/2 and in anteriorly at 2.6 cm on 58/2. Left upper lobe endobronchial displacement and obstruction, including on 77/3. Right-sided pulmonary nodules, including right upper lobe 9 mm nodule on 40/3 and 8 mm more inferior right upper lobe pulmonary nodule on 66/3. Inferior left upper lobe/lingular central obstructive lung mass, including on the order of 11.5 x 7.7 cm on 74/2. More cephalad left upper lobe pulmonary nodularity, including at 1.4 cm on 43/3. Smooth left apical septal thickening, suspicious for lymphangitic tumor spread. Upper Abdomen: Motion degradation continuing into the upper abdomen, moderate in severity. Grossly normal imaged liver, spleen, stomach, gallbladder, pancreas, adrenal glands, kidneys. Upper abdominal adenopathy, with a preaortic node measuring 1.4 cm on 167/2. Musculoskeletal: Osseous destruction of the anterior left third rib on 55/2. IMPRESSION: 1. Left upper lobe/lingular primary bronchogenic carcinoma. 2. 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 3. Motion degraded exam. 4. Coronary artery atherosclerosis. Aortic Atherosclerosis (ICD10-I70.0). 5. Small pericardial effusion. Electronically Signed   By: Adria Devon.D.  On: 07/12/2019 09:13   NM PET Image Initial (PI) Skull Base To Thigh  Result Date: 07/22/2019 CLINICAL DATA:  Initial treatment strategy for metastatic left lung cancer. EXAM: NUCLEAR MEDICINE PET SKULL BASE TO THIGH TECHNIQUE: 12.3 mCi F-18 FDG was injected intravenously. Full-ring PET imaging was performed from the skull base to thigh after the  radiotracer. CT data was obtained and used for attenuation correction and anatomic localization. Fasting blood glucose: 104 mg/dl COMPARISON:  CT chest dated 07/12/2019 FINDINGS: Mediastinal blood pool activity: SUV max 2.7 Liver activity: SUV max NA NECK: 9 mm short axis left lower cervical node (series 4/image 38), max SUV 5.5. Paretic left vocal cord. Associated mild hypermetabolism of the posterior right vocal fold (PET image 48), max SUV 7.4, favored to be physiologic. If clinically warranted given the additional findings, direct inspection could be considered. Incidental CT findings: none CHEST: Left upper lobe mass, compatible primary bronchogenic neoplasm and better evaluated on recent CT chest, max SUV 22.5. Direct invasion of the mediastinum. Associated moderate left pleural effusion with multifocal pleural-based metastases, including a dominant 2.0 x 5.8 cm lesion inferiorly (series 4/image 119), max SUV 14.6. Left lower lobe compressive atelectasis. Widespread thoracic metastases, including left supraclavicular, left subpectoral/axillary, left superior mediastinal/prevascular/IMA, right paratracheal, subcarinal, retrocrural, and right hilar nodal metastases. Index 2.5 cm short axis node at the left thoracic inlet, max SUV 27.1. Scattered pulmonary nodules in the right lung, including a dominant 12 mm right upper lobe nodule (series 8/image 21), max SUV 5.8, compatible with pulmonary metastases. Incidental CT findings: Atherosclerotic calcifications of the aortic arch. Coronary atherosclerosis of the LAD. ABDOMEN/PELVIS: No abnormal hypermetabolism in the liver, spleen, pancreas, or adrenal glands. Retroperitoneal/para-aortic lymphadenopathy in the upper abdomen, including a 1.9 cm short axis left anterior para-aortic node (series 4/image 127), max SUV 2.2. Incidental CT findings: Atherosclerotic calcifications the abdominal aorta and branch vessels. Suspected postsurgical changes related to prior TURP.  SKELETON: 1.9 x 4.0 cm metastasis involving the left anterior 3rd rib (series 4/image 67), max SUV 25.6. Incidental CT findings: Degenerative changes of the lumbar spine. IMPRESSION: Left upper lobe mass, corresponding to primary bronchogenic neoplasm. Moderate left pleural effusion with pleural-based metastases, as above. Contralateral pulmonary metastases on the right. Widespread thoracic nodal metastases, as above. Associated mild upper abdominal/retroperitoneal nodal metastases. Mild left lower cervical nodal metastasis. Osseous metastasis involving the left anterior 3rd rib. 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 If clinically warranted given the additional findings, direct inspection could be considered. Electronically Signed   By: Julian Hy M.D.   On: 07/22/2019 14:44   CT Biopsy  Result Date: 07/24/2019 INDICATION: 64 year old male with a history metastatic disease, likely lung carcinoma. He has been referred for biopsy of left anterior third rib lesion EXAM: CT BIOPSY MEDICATIONS: None. ANESTHESIA/SEDATION: Moderate (conscious) sedation was employed during this procedure. A total of Versed 2.0 mg and Fentanyl 100 mcg was administered intravenously. Moderate Sedation Time: 15 minutes. The patient's level of consciousness and vital signs were monitored continuously by radiology nursing throughout the procedure under my direct supervision. FLUOROSCOPY TIME:  CT COMPLICATIONS: None PROCEDURE: The procedure, risks, benefits, and alternatives were explained to the patient and the patient's family. Specific risks that were addressed included bleeding, infection, pneumothorax, need for further procedure including chest tube placement, chance of delayed pneumothorax or hemorrhage, hemoptysis, nondiagnostic sample, cardiopulmonary collapse, death. Questions regarding the procedure were encouraged and answered.  The patient  understands and consents to the procedure. Patient was positioned in the supine position on the CT gantry table and a scout CT of the chest was performed for planning purposes. Once angle of approach was determined, the skin and subcutaneous tissues this scan was prepped and draped in the usual sterile fashion, and a sterile drape was applied covering the operative field. A sterile gown and sterile gloves were used for the procedure. Local anesthesia was provided with 1% Lidocaine. The skin and subcutaneous tissues were infiltrated 1% lidocaine for local anesthesia, and a small stab incision was made with an 11 blade scalpel. Using CT guidance, a 17 gauge trocar needle was advanced into the left anterior third ribtarget. After confirmation of the tip, separate 18 gauge core biopsies were performed. These were placed into solution for transportation to the lab. Final CT was performed. Patient tolerated the procedure well and remained hemodynamically stable throughout. No complications were encountered and no significant blood loss was encounter IMPRESSION: Status post CT-guided biopsy of left anterior third rib lesion. Tissue specimen sent to pathology for complete histopathologic analysis. Signed, Dulcy Fanny. Dellia Nims, RPVI [facility] Electronically Signed   By: Corrie Mckusick D.O.   On: 07/24/2019 11:20   DG Chest Port 1 View  Result Date: 08/08/2019 CLINICAL DATA:  Post LEFT thoracentesis, removal 1.2 L of fluid, metastatic lung cancer EXAM: PORTABLE CHEST 1 VIEW COMPARISON:  Portable exam at 1105 hrs compared to 08/07/2019 FINDINGS: Persistent complete opacification of the LEFT hemithorax despite pain at removal of 1.2 L of fluid. Slight mediastinal shift to the RIGHT. RIGHT lung clear. No pneumothorax. Osseous structures unremarkable. IMPRESSION: No pneumothorax following LEFT thoracentesis. Persistent diffuse opacification of the LEFT  hemithorax despite thoracentesis, representing a combination of tumor, atelectatic lung and residual effusion. Electronically Signed   By: Lavonia Dana M.D.   On: 08/08/2019 11:46   DG Chest Port 1 View  Result Date: 08/07/2019 CLINICAL DATA:  The patient states increasing SOB for a few days. Patient has lung cancer and had a thoracentesis on 07/24/2019. Patient positive for Covid-19 on 07/24/2019. Hx of HTN and current smoker. EXAM: PORTABLE CHEST 1 VIEW COMPARISON:  Chest radiograph 07/23/2018 FINDINGS: Visualized portions of the cardiomediastinal contours appear stable. Redemonstrated diffuse opacification of the left hemithorax with rightward mediastinal shift. The right lung appears clear. No right pleural effusion. No large pneumothorax. No acute finding in the visualized skeleton. IMPRESSION: Redemonstrated diffuse opacification of the left hemithorax with associated rightward mediastinal shift. Electronically Signed   By: Audie Pinto M.D.   On: 08/07/2019 20:31   DG Chest Port 1 View  Result Date: 07/24/2019 CLINICAL DATA:  Post thoracentesis on the left today. Metastatic lung cancer. COVID-19 infection. EXAM: PORTABLE CHEST 1 VIEW COMPARISON:  Radiographs 07/02/2019, CT 07/12/2019 and PET-CT 07/22/2019. FINDINGS: 1344 hours. Two views obtained. There is near complete whiteout of the left hemithorax, similar to the most recent PET-CT done 2 days ago. This corresponds with a residual large pleural effusion and subtotal collapse of the left lung. No evidence of pneumothorax. There is residual mild mediastinal shift to the right. Underlying right lung nodularity is noted. IMPRESSION: No evidence of pneumothorax following left thoracentesis. Persistent near complete whiteout of the left hemithorax consistent with a large pleural effusion and subtotal left lung collapse as seen on recent PET-CT. Electronically Signed   By: Richardean Sale M.D.   On: 07/24/2019 13:58   US THORACENTESIS ASP PLEURAL  SPACE W/IMG GUIDE  Result Date: 07/24/2019 INDICATION: Patient with history of metastatic disease, likely lung carcinoma, dyspnea, COVID-19 positive, left pleural effusion. Request received for diagnostic and therapeutic left thoracentesis. EXAM: ULTRASOUND GUIDED DIAGNOSTIC AND THERAPEUTIC LEFT THORACENTESIS MEDICATIONS: None COMPLICATIONS: None immediate. PROCEDURE: An ultrasound guided thoracentesis was thoroughly discussed with the patient and questions answered. The benefits, risks, alternatives and complications were also discussed. The patient understands and wishes to proceed with the procedure. Written consent was obtained. Ultrasound was performed to localize and mark an adequate pocket of fluid in the left chest. The area was then prepped and draped in the normal sterile fashion. 1% Lidocaine was used for local anesthesia. Under ultrasound guidance a 6 Fr Safe-T-Centesis catheter was introduced. Thoracentesis was performed. The catheter was removed and a dressing applied. FINDINGS: A total of approximately 1.3 liters of blood-tinged fluid was removed. Samples were sent to the laboratory as requested by the clinical team. Due to this being patient's initial thoracentesis only the above amount of fluid was removed today. IMPRESSION: Successful ultrasound guided diagnostic and therapeutic left thoracentesis yielding 1.3 liters of pleural fluid. Read by: Rowe Robert, PA-C Electronically Signed   By: Corrie Mckusick D.O.   On: 07/24/2019 13:45     Discharge Exam: Vitals:   08/08/19 0557 08/08/19 1339  BP: (!) 131/91 104/72  Pulse: (!) 103 97  Resp: (!) 22   Temp: 97.8 F (36.6 C) 98.3 F (36.8 C)  SpO2: 100%    Vitals:   08/07/19 2345 08/08/19 0151 08/08/19 0557 08/08/19 1339  BP: (!) 125/95 (!) 118/98 (!) 131/91 104/72  Pulse: (!) 125 (!) 105 (!) 103 97  Resp: (!) 22 (!) 22 (!) 22   Temp: 98.4 F (36.9 C) 98.6 F (37 C) 97.8 F (36.6 C) 98.3 F (36.8 C)  TempSrc: Oral Oral Oral Oral   SpO2: 100% 98% 100%   Weight:      Height:        General: Pt is alert, awake, not in acute distress Cardiovascular: RRR, S1/S2 +, no rubs, no gallops Respiratory: CTA bilaterally, no wheezing, no rhonchi Abdominal: Soft, NT, ND, bowel sounds + Extremities: no edema, no cyanosis    The results of significant diagnostics from this hospitalization (including imaging, microbiology, ancillary and laboratory) are listed below for reference.     Microbiology: No results found for this or any previous visit (from the past 240 hour(s)).   Labs: BNP (last 3 results) Recent Labs    08/07/19 2006  BNP 500.9*   Basic Metabolic Panel: Recent Labs  Lab 08/07/19 2006  NA 137  K 4.1  CL 91*  CO2 30  GLUCOSE 125*  BUN 19  CREATININE 0.96  CALCIUM 12.0*   Liver Function Tests: Recent Labs  Lab 08/07/19 2006  AST 41  ALT 53*  ALKPHOS 88  BILITOT 1.3*  PROT 7.4  ALBUMIN 3.5   No results for input(s): LIPASE, AMYLASE in the last 168 hours. No results for input(s): AMMONIA in the last 168 hours. CBC: Recent Labs  Lab 08/07/19 2006 08/08/19 0645  WBC 11.2* 10.6*  NEUTROABS 9.4* 8.6*  HGB 15.4 14.0  HCT 47.7 42.5  MCV 92.3 93.0  PLT 359 300   Cardiac Enzymes: No results for input(s): CKTOTAL, CKMB, CKMBINDEX, TROPONINI in the last 168 hours. BNP: Invalid input(s): POCBNP CBG: No results for input(s): GLUCAP in the last 168 hours. D-Dimer No results for input(s): DDIMER in the last 72 hours. Hgb A1c No results for input(s): HGBA1C in  the last 72 hours. Lipid Profile No results for input(s): CHOL, HDL, LDLCALC, TRIG, CHOLHDL, LDLDIRECT in the last 72 hours. Thyroid function studies No results for input(s): TSH, T4TOTAL, T3FREE, THYROIDAB in the last 72 hours.  Invalid input(s): FREET3 Anemia work up No results for input(s): VITAMINB12, FOLATE, FERRITIN, TIBC, IRON, RETICCTPCT in the last 72 hours. Urinalysis No results found for: COLORURINE, APPEARANCEUR,  LABSPEC, [place], GLUCOSEU, HGBUR, BILIRUBINUR, KETONESUR, PROTEINUR, UROBILINOGEN, NITRITE, LEUKOCYTESUR Sepsis Labs Invalid input(s): PROCALCITONIN,  WBC,  LACTICIDVEN Microbiology No results found for this or any previous visit (from the past 240 hour(s)).   Time coordinating discharge: 35 minutes  SIGNED:   Rodena Goldmann, DO [facility] 08/08/2019, 2:09 PM  If 7PM-7AM, please contact night-coverage [email or web address]

## 2019-08-08 NOTE — Progress Notes (Signed)
Chest xray order placed. 

## 2019-08-08 NOTE — Plan of Care (Signed)
  Problem: Education: Goal: Knowledge of General Education information will improve Description: Including pain rating scale, medication(s)/side effects and non-pharmacologic comfort measures 08/08/2019 1528 by Loralie Champagne, RN Outcome: Completed/Met 08/08/2019 1528 by Loralie Champagne, RN Outcome: Adequate for Discharge 08/08/2019 1453 by Loralie Champagne, RN Outcome: Progressing   Problem: Clinical Measurements: Goal: Diagnostic test results will improve 08/08/2019 1528 by Loralie Champagne, RN Outcome: Completed/Met 08/08/2019 1528 by Loralie Champagne, RN Outcome: Adequate for Discharge 08/08/2019 1453 by Loralie Champagne, RN Outcome: Progressing Goal: Respiratory complications will improve 08/08/2019 1528 by Loralie Champagne, RN Outcome: Completed/Met 08/08/2019 1528 by Loralie Champagne, RN Outcome: Adequate for Discharge 08/08/2019 1453 by Loralie Champagne, RN Outcome: Progressing Goal: Cardiovascular complication will be avoided 08/08/2019 1528 by Loralie Champagne, RN Outcome: Completed/Met 08/08/2019 1528 by Loralie Champagne, RN Outcome: Adequate for Discharge 08/08/2019 1453 by Loralie Champagne, RN Outcome: Progressing   Problem: Activity: Goal: Risk for activity intolerance will decrease 08/08/2019 1528 by Loralie Champagne, RN Outcome: Completed/Met 08/08/2019 1528 by Loralie Champagne, RN Outcome: Adequate for Discharge 08/08/2019 1453 by Loralie Champagne, RN Outcome: Progressing   Problem: Pain Managment: Goal: General experience of comfort will improve 08/08/2019 1528 by Loralie Champagne, RN Outcome: Completed/Met 08/08/2019 1528 by Loralie Champagne, RN Outcome: Adequate for Discharge 08/08/2019 1453 by Loralie Champagne, RN Outcome: Progressing

## 2019-08-08 NOTE — Progress Notes (Signed)
Pt AVS printed out. Patient would like to try to eat something before he leaves. He will let this RN know when he is ready to d/c. He has contacted wife.

## 2019-08-08 NOTE — Progress Notes (Signed)
Patient discharged off unit, escorted by this RN. Belongings and AVS in hand. Picked up by wife.

## 2019-08-12 ENCOUNTER — Telehealth (HOSPITAL_COMMUNITY): Payer: Self-pay | Admitting: Hematology

## 2019-08-12 NOTE — Telephone Encounter (Signed)
Per [employer] website and csr Levada Dy at pts home plan. Genetic testing does not require and auth.

## 2019-08-15 ENCOUNTER — Other Ambulatory Visit: Payer: Self-pay

## 2019-08-15 ENCOUNTER — Ambulatory Visit (HOSPITAL_COMMUNITY): Payer: BC Managed Care – PPO | Admitting: Hematology

## 2019-08-15 ENCOUNTER — Encounter (HOSPITAL_COMMUNITY): Payer: Self-pay

## 2019-08-15 ENCOUNTER — Emergency Department (HOSPITAL_COMMUNITY): Payer: BC Managed Care – PPO

## 2019-08-15 ENCOUNTER — Inpatient Hospital Stay (HOSPITAL_COMMUNITY)
Admission: EM | Admit: 2019-08-15 | Discharge: 2019-08-26 | DRG: 163 | Disposition: E | Payer: BC Managed Care – PPO | Attending: Pulmonary Disease | Admitting: Pulmonary Disease

## 2019-08-15 DIAGNOSIS — C3412 Malignant neoplasm of upper lobe, left bronchus or lung: Secondary | ICD-10-CM | POA: Diagnosis not present

## 2019-08-15 DIAGNOSIS — R570 Cardiogenic shock: Secondary | ICD-10-CM | POA: Diagnosis not present

## 2019-08-15 DIAGNOSIS — J9601 Acute respiratory failure with hypoxia: Secondary | ICD-10-CM | POA: Diagnosis not present

## 2019-08-15 DIAGNOSIS — I429 Cardiomyopathy, unspecified: Secondary | ICD-10-CM | POA: Diagnosis not present

## 2019-08-15 DIAGNOSIS — C7951 Secondary malignant neoplasm of bone: Secondary | ICD-10-CM | POA: Diagnosis not present

## 2019-08-15 DIAGNOSIS — Z4682 Encounter for fitting and adjustment of non-vascular catheter: Secondary | ICD-10-CM | POA: Diagnosis not present

## 2019-08-15 DIAGNOSIS — J3801 Paralysis of vocal cords and larynx, unilateral: Secondary | ICD-10-CM | POA: Diagnosis present

## 2019-08-15 DIAGNOSIS — F17200 Nicotine dependence, unspecified, uncomplicated: Secondary | ICD-10-CM | POA: Diagnosis present

## 2019-08-15 DIAGNOSIS — Z66 Do not resuscitate: Secondary | ICD-10-CM | POA: Diagnosis not present

## 2019-08-15 DIAGNOSIS — N179 Acute kidney failure, unspecified: Secondary | ICD-10-CM | POA: Diagnosis not present

## 2019-08-15 DIAGNOSIS — J9811 Atelectasis: Secondary | ICD-10-CM | POA: Diagnosis present

## 2019-08-15 DIAGNOSIS — U071 COVID-19: Secondary | ICD-10-CM | POA: Diagnosis not present

## 2019-08-15 DIAGNOSIS — Z01818 Encounter for other preprocedural examination: Secondary | ICD-10-CM

## 2019-08-15 DIAGNOSIS — G9341 Metabolic encephalopathy: Secondary | ICD-10-CM | POA: Diagnosis not present

## 2019-08-15 DIAGNOSIS — C7800 Secondary malignant neoplasm of unspecified lung: Secondary | ICD-10-CM | POA: Diagnosis present

## 2019-08-15 DIAGNOSIS — L8990 Pressure ulcer of unspecified site, unspecified stage: Secondary | ICD-10-CM | POA: Diagnosis not present

## 2019-08-15 DIAGNOSIS — E861 Hypovolemia: Secondary | ICD-10-CM | POA: Diagnosis not present

## 2019-08-15 DIAGNOSIS — A419 Sepsis, unspecified organism: Secondary | ICD-10-CM | POA: Diagnosis not present

## 2019-08-15 DIAGNOSIS — C3490 Malignant neoplasm of unspecified part of unspecified bronchus or lung: Secondary | ICD-10-CM | POA: Diagnosis not present

## 2019-08-15 DIAGNOSIS — Z452 Encounter for adjustment and management of vascular access device: Secondary | ICD-10-CM | POA: Diagnosis not present

## 2019-08-15 DIAGNOSIS — C384 Malignant neoplasm of pleura: Secondary | ICD-10-CM | POA: Diagnosis not present

## 2019-08-15 DIAGNOSIS — N170 Acute kidney failure with tubular necrosis: Secondary | ICD-10-CM | POA: Diagnosis not present

## 2019-08-15 DIAGNOSIS — Z7189 Other specified counseling: Secondary | ICD-10-CM | POA: Diagnosis not present

## 2019-08-15 DIAGNOSIS — J910 Malignant pleural effusion: Secondary | ICD-10-CM | POA: Diagnosis not present

## 2019-08-15 DIAGNOSIS — R34 Anuria and oliguria: Secondary | ICD-10-CM | POA: Diagnosis not present

## 2019-08-15 DIAGNOSIS — C3492 Malignant neoplasm of unspecified part of left bronchus or lung: Secondary | ICD-10-CM | POA: Diagnosis not present

## 2019-08-15 DIAGNOSIS — I82622 Acute embolism and thrombosis of deep veins of left upper extremity: Secondary | ICD-10-CM | POA: Diagnosis present

## 2019-08-15 DIAGNOSIS — Z9889 Other specified postprocedural states: Secondary | ICD-10-CM

## 2019-08-15 DIAGNOSIS — R6521 Severe sepsis with septic shock: Secondary | ICD-10-CM | POA: Diagnosis not present

## 2019-08-15 DIAGNOSIS — I9581 Postprocedural hypotension: Secondary | ICD-10-CM | POA: Diagnosis not present

## 2019-08-15 DIAGNOSIS — J90 Pleural effusion, not elsewhere classified: Secondary | ICD-10-CM

## 2019-08-15 DIAGNOSIS — H409 Unspecified glaucoma: Secondary | ICD-10-CM | POA: Diagnosis present

## 2019-08-15 DIAGNOSIS — R911 Solitary pulmonary nodule: Secondary | ICD-10-CM | POA: Diagnosis not present

## 2019-08-15 DIAGNOSIS — Z95828 Presence of other vascular implants and grafts: Secondary | ICD-10-CM | POA: Diagnosis not present

## 2019-08-15 DIAGNOSIS — R634 Abnormal weight loss: Secondary | ICD-10-CM | POA: Diagnosis present

## 2019-08-15 DIAGNOSIS — J9600 Acute respiratory failure, unspecified whether with hypoxia or hypercapnia: Secondary | ICD-10-CM

## 2019-08-15 DIAGNOSIS — I82B12 Acute embolism and thrombosis of left subclavian vein: Secondary | ICD-10-CM | POA: Diagnosis present

## 2019-08-15 DIAGNOSIS — E785 Hyperlipidemia, unspecified: Secondary | ICD-10-CM | POA: Diagnosis not present

## 2019-08-15 DIAGNOSIS — R000 Tachycardia, unspecified: Secondary | ICD-10-CM | POA: Diagnosis not present

## 2019-08-15 DIAGNOSIS — Z09 Encounter for follow-up examination after completed treatment for conditions other than malignant neoplasm: Secondary | ICD-10-CM

## 2019-08-15 DIAGNOSIS — Z7982 Long term (current) use of aspirin: Secondary | ICD-10-CM

## 2019-08-15 DIAGNOSIS — R1032 Left lower quadrant pain: Secondary | ICD-10-CM | POA: Diagnosis present

## 2019-08-15 DIAGNOSIS — Z79899 Other long term (current) drug therapy: Secondary | ICD-10-CM

## 2019-08-15 DIAGNOSIS — Z8616 Personal history of COVID-19: Secondary | ICD-10-CM | POA: Diagnosis not present

## 2019-08-15 DIAGNOSIS — I10 Essential (primary) hypertension: Secondary | ICD-10-CM

## 2019-08-15 DIAGNOSIS — I313 Pericardial effusion (noninflammatory): Secondary | ICD-10-CM | POA: Diagnosis not present

## 2019-08-15 DIAGNOSIS — J9690 Respiratory failure, unspecified, unspecified whether with hypoxia or hypercapnia: Secondary | ICD-10-CM | POA: Diagnosis not present

## 2019-08-15 DIAGNOSIS — C778 Secondary and unspecified malignant neoplasm of lymph nodes of multiple regions: Secondary | ICD-10-CM | POA: Diagnosis not present

## 2019-08-15 DIAGNOSIS — Z515 Encounter for palliative care: Secondary | ICD-10-CM | POA: Diagnosis not present

## 2019-08-15 DIAGNOSIS — Z419 Encounter for procedure for purposes other than remedying health state, unspecified: Secondary | ICD-10-CM

## 2019-08-15 DIAGNOSIS — R410 Disorientation, unspecified: Secondary | ICD-10-CM | POA: Diagnosis present

## 2019-08-15 DIAGNOSIS — R918 Other nonspecific abnormal finding of lung field: Secondary | ICD-10-CM | POA: Diagnosis not present

## 2019-08-15 DIAGNOSIS — R579 Shock, unspecified: Secondary | ICD-10-CM | POA: Diagnosis not present

## 2019-08-15 DIAGNOSIS — I314 Cardiac tamponade: Secondary | ICD-10-CM | POA: Diagnosis not present

## 2019-08-15 DIAGNOSIS — R0602 Shortness of breath: Secondary | ICD-10-CM | POA: Diagnosis not present

## 2019-08-15 DIAGNOSIS — J3800 Paralysis of vocal cords and larynx, unspecified: Secondary | ICD-10-CM | POA: Diagnosis not present

## 2019-08-15 DIAGNOSIS — I4891 Unspecified atrial fibrillation: Secondary | ICD-10-CM | POA: Diagnosis not present

## 2019-08-15 DIAGNOSIS — C380 Malignant neoplasm of heart: Secondary | ICD-10-CM | POA: Diagnosis not present

## 2019-08-15 DIAGNOSIS — D72829 Elevated white blood cell count, unspecified: Secondary | ICD-10-CM | POA: Diagnosis present

## 2019-08-15 DIAGNOSIS — R609 Edema, unspecified: Secondary | ICD-10-CM | POA: Diagnosis not present

## 2019-08-15 DIAGNOSIS — J948 Other specified pleural conditions: Secondary | ICD-10-CM | POA: Diagnosis not present

## 2019-08-15 DIAGNOSIS — F17210 Nicotine dependence, cigarettes, uncomplicated: Secondary | ICD-10-CM | POA: Diagnosis not present

## 2019-08-15 DIAGNOSIS — J9809 Other diseases of bronchus, not elsewhere classified: Secondary | ICD-10-CM | POA: Diagnosis not present

## 2019-08-15 DIAGNOSIS — I319 Disease of pericardium, unspecified: Secondary | ICD-10-CM | POA: Diagnosis not present

## 2019-08-15 DIAGNOSIS — J939 Pneumothorax, unspecified: Secondary | ICD-10-CM | POA: Diagnosis not present

## 2019-08-15 HISTORY — DX: Malignant neoplasm of unspecified part of unspecified bronchus or lung: C34.90

## 2019-08-15 LAB — CBC WITH DIFFERENTIAL/PLATELET
Abs Immature Granulocytes: 0.06 10*3/uL (ref 0.00–0.07)
Basophils Absolute: 0 10*3/uL (ref 0.0–0.1)
Basophils Relative: 0 %
Eosinophils Absolute: 0 10*3/uL (ref 0.0–0.5)
Eosinophils Relative: 0 %
HCT: 46.9 % (ref 39.0–52.0)
Hemoglobin: 15.3 g/dL (ref 13.0–17.0)
Immature Granulocytes: 0 %
Lymphocytes Relative: 4 %
Lymphs Abs: 0.6 10*3/uL — ABNORMAL LOW (ref 0.7–4.0)
MCH: 30.2 pg (ref 26.0–34.0)
MCHC: 32.6 g/dL (ref 30.0–36.0)
MCV: 92.7 fL (ref 80.0–100.0)
Monocytes Absolute: 1.1 10*3/uL — ABNORMAL HIGH (ref 0.1–1.0)
Monocytes Relative: 7 %
Neutro Abs: 13.8 10*3/uL — ABNORMAL HIGH (ref 1.7–7.7)
Neutrophils Relative %: 89 %
Platelets: 279 10*3/uL (ref 150–400)
RBC: 5.06 MIL/uL (ref 4.22–5.81)
RDW: 14.2 % (ref 11.5–15.5)
WBC: 15.5 10*3/uL — ABNORMAL HIGH (ref 4.0–10.5)
nRBC: 0 % (ref 0.0–0.2)

## 2019-08-15 LAB — GRAM STAIN: Gram Stain: NONE SEEN

## 2019-08-15 LAB — BASIC METABOLIC PANEL
Anion gap: 17 — ABNORMAL HIGH (ref 5–15)
BUN: 43 mg/dL — ABNORMAL HIGH (ref 8–23)
CO2: 32 mmol/L (ref 22–32)
Calcium: 13.9 mg/dL (ref 8.9–10.3)
Chloride: 90 mmol/L — ABNORMAL LOW (ref 98–111)
Creatinine, Ser: 1.23 mg/dL (ref 0.61–1.24)
GFR calc Af Amer: >60 mL/min (ref >60)
GFR calc non Af Amer: >60 mL/min (ref >60)
Glucose, Bld: 141 mg/dL — ABNORMAL HIGH (ref 70–99)
Potassium: 4.2 mmol/L (ref 3.5–5.1)
Sodium: 139 mmol/L (ref 135–145)

## 2019-08-15 LAB — PHOSPHORUS: Phosphorus: 3.3 mg/dL (ref 2.5–4.6)

## 2019-08-15 LAB — MAGNESIUM: Magnesium: 2.3 mg/dL (ref 1.7–2.4)

## 2019-08-15 LAB — BRAIN NATRIURETIC PEPTIDE: B Natriuretic Peptide: 178 pg/mL — ABNORMAL HIGH (ref 0.0–100.0)

## 2019-08-15 LAB — TSH: TSH: 0.58 u[IU]/mL (ref 0.350–4.500)

## 2019-08-15 MED ORDER — SODIUM CHLORIDE 0.9 % IV SOLN: INTRAVENOUS | Status: DC

## 2019-08-15 MED ORDER — LISINOPRIL 40 MG PO TABS
40.0000 mg | ORAL_TABLET | Freq: Every evening | ORAL | Status: DC
  Administered 2019-08-15: 17:00:00 40 mg via ORAL
  Filled 2019-08-15: qty 1

## 2019-08-15 MED ORDER — ONDANSETRON HCL 4 MG PO TABS: 4.0000 mg | ORAL_TABLET | Freq: Four times a day (QID) | ORAL | Status: DC | PRN

## 2019-08-15 MED ORDER — SODIUM CHLORIDE 0.9 % IV BOLUS: 500.0000 mL | Freq: Once | INTRAVENOUS | Status: DC

## 2019-08-15 MED ORDER — DENOSUMAB 60 MG/ML ~~LOC~~ SOSY
60.0000 mg | PREFILLED_SYRINGE | Freq: Once | SUBCUTANEOUS | Status: DC
  Filled 2019-08-15: qty 1

## 2019-08-15 MED ORDER — PRAVASTATIN SODIUM 10 MG PO TABS
20.0000 mg | ORAL_TABLET | Freq: Every evening | ORAL | Status: DC
  Administered 2019-08-15 – 2019-08-21 (×6): 20 mg via ORAL
  Filled 2019-08-15 (×4): qty 2
  Filled 2019-08-15: qty 1
  Filled 2019-08-15 (×3): qty 2

## 2019-08-15 MED ORDER — SODIUM CHLORIDE 0.9 % IV BOLUS
1000.0000 mL | Freq: Once | INTRAVENOUS | Status: AC
  Administered 2019-08-15: 1000 mL via INTRAVENOUS

## 2019-08-15 MED ORDER — ENOXAPARIN SODIUM 40 MG/0.4ML ~~LOC~~ SOLN
40.0000 mg | SUBCUTANEOUS | Status: DC
  Administered 2019-08-15: 40 mg via SUBCUTANEOUS
  Filled 2019-08-15: qty 0.4

## 2019-08-15 MED ORDER — ASCORBIC ACID 500 MG PO TABS
500.0000 mg | ORAL_TABLET | Freq: Every morning | ORAL | Status: DC
  Administered 2019-08-17 – 2019-08-22 (×5): 500 mg via ORAL
  Filled 2019-08-15 (×5): qty 1

## 2019-08-15 MED ORDER — ADULT MULTIVITAMIN W/MINERALS CH
1.0000 | ORAL_TABLET | Freq: Every morning | ORAL | Status: DC
  Administered 2019-08-17 – 2019-08-22 (×5): 1 via ORAL
  Filled 2019-08-15 (×5): qty 1

## 2019-08-15 MED ORDER — ACETAMINOPHEN 650 MG RE SUPP: 650.0000 mg | Freq: Four times a day (QID) | RECTAL | Status: DC | PRN

## 2019-08-15 MED ORDER — ASPIRIN EC 81 MG PO TBEC
81.0000 mg | DELAYED_RELEASE_TABLET | Freq: Every morning | ORAL | Status: DC
  Administered 2019-08-17 – 2019-08-19 (×3): 81 mg via ORAL
  Filled 2019-08-15 (×3): qty 1

## 2019-08-15 MED ORDER — ONDANSETRON HCL 4 MG/2ML IJ SOLN: 4.0000 mg | Freq: Four times a day (QID) | INTRAMUSCULAR | Status: DC | PRN

## 2019-08-15 MED ORDER — LATANOPROST 0.005 % OP SOLN
1.0000 [drp] | Freq: Every day | OPHTHALMIC | Status: DC
  Administered 2019-08-15 – 2019-08-21 (×7): 1 [drp] via OPHTHALMIC
  Filled 2019-08-15 (×2): qty 2.5

## 2019-08-15 MED ORDER — ACETAMINOPHEN 325 MG PO TABS
650.0000 mg | ORAL_TABLET | Freq: Four times a day (QID) | ORAL | Status: DC | PRN
  Administered 2019-08-16: 15:00:00 650 mg via ORAL
  Filled 2019-08-15: qty 2

## 2019-08-15 NOTE — ED Notes (Signed)
Called [facility] with bed assignment at [facility] and for transport.

## 2019-08-15 NOTE — ED Provider Notes (Signed)
Medical screening examination/treatment/procedure(s) were conducted as a shared visit with non-physician practitioner(s) and myself.  I personally evaluated the patient during the encounter.  EKG Interpretation  Date/Time:  Thursday August 15 2019 09:06:54 EST Ventricular Rate:  134 PR Interval:    QRS Duration: 64 QT Interval:  276 QTC Calculation: 412 R Axis:   61 Text Interpretation: Sinus tachycardia Low voltage, extremity and precordial leads Anteroseptal infarct, old No significant change since last tracing Confirmed by Dorie Rank [phone]) on 07/31/2019 9:21:48 AM  Pt presents with recurrent dyspnea.  Known history of malignancy and malignant pleural effusion.  CXR consistent with recurrent large effusion.   Will arrange for emergent thoracentesis in the ED.    Dorie Rank, MD 08/14/2019 [phone]

## 2019-08-15 NOTE — Procedures (Signed)
   [place] Left thoracentesis  1.5 L bloody fluid sent for labs per MD  Tolerated well  CXR pending EBL: 0.5 cc

## 2019-08-15 NOTE — Progress Notes (Signed)
Notified [facility] of pt's arrival. Advised to make sure pt NPO after midnight. Order is in place. Pt made aware of plan. Pt on schedule for 8:45 tomorrow morning.

## 2019-08-15 NOTE — ED Triage Notes (Signed)
Pt was recently diagnosed with lung cancer. Has not began treatments. Becoming increasingly SOB with exertion recently. Thoracentesis done recently. Respiration 40 times per minute, O2 sats 98%. Tachycardic.

## 2019-08-15 NOTE — H&P (Signed)
History and Physical    [name] YBO:[id number] DOB: 03-24-1956 DOA: 08/02/2019  Referring MD/NP/PA: Dr. Acquanetta Belling  PCP: Mikey Kirschner, MD  Patient coming from: home  Chief Complaint: SOB, left pleural effusion malignant effusion   HPI: [name] is a 64 y.o. male medical history significant for hyperlipidemia, hypertension, left lung bronchogenic carcinoma and recurrent left pleural effusion; who presented to ED with worsening SOB.  Denies any fever, chills, productive cough or chest pain.  He reports symptoms are similar to the last 2 time he has been in the hospital with recurrent pleural effusion.  Shortness of breath has been progressively worsening over the last 3-4 days; of note, he was last discharge on 08/07/2019.   Patient denies nausea, vomiting, headaches, blurred vision, dysuria, hematuria, melena, hematochezia, abdominal pain, urinary retention, focal weakness or any other complaints.  Work-up in the ED demonstrated positive large/diffuse left pleural effusion with mild midline shift and; blood work demonstrated some leukocytosis and also hypercalcemia.  Emergent bedside ultrasound thoracentesis was performed to assist patient with shortness of breath and hypoxia.  [facility] has been contacted to admit patient for further management and management.  IV fluids and Prolia treatment initiated while in the ED.  Past Medical/Surgical History: Past Medical History:  Diagnosis Date  . ED (erectile dysfunction)   . Hyperlipidemia   . Hypertension   . Lung cancer [facility])     Past Surgical History:  Procedure Laterality Date  . APPENDECTOMY    . COLONOSCOPY      Social History:  reports that he has been smoking. He has never used smokeless tobacco. No history on file for alcohol and drug.  Allergies: No Known Allergies  Family History:  No family history on file.  Prior to Admission medications   Medication Sig Start Date End Date Taking? Authorizing Provider    ascorbic acid (VITAMIN C) 500 MG tablet Take 500 mg by mouth every morning.   Yes [provider]  aspirin EC 81 MG tablet Take 81 mg by mouth every morning.    Yes [provider]  hydrochlorothiazide (HYDRODIURIL) 25 MG tablet One tablet daily in morning Patient taking differently: Take 25 mg by mouth every morning.  03/11/19  Yes Mikey Kirschner, MD  HYDROcodone-acetaminophen (NORCO/VICODIN) 5-325 MG tablet Take 1 tablet by mouth every 8 (eight) hours as needed for moderate pain. 08/05/19  Yes Derek Jack, MD  lisinopril (ZESTRIL) 40 MG tablet Take 1 tablet (40 mg total) by mouth daily. Patient taking differently: Take 40 mg by mouth every evening.  03/11/19  Yes Mikey Kirschner, MD  Multiple Vitamin (MULTIVITAMIN) tablet Take 1 tablet by mouth every morning.    Yes [provider]  pravastatin (PRAVACHOL) 20 MG tablet TAKE 1 TABLET (20 MG TOTAL) BY MOUTH DAILY. Patient taking differently: Take 20 mg by mouth every evening.  03/11/19  Yes Mikey Kirschner, MD  TRAVATAN Z 0.004 % SOLN ophthalmic solution Place 1 drop into both eyes daily.  10/05/15  Yes [provider]    Review of Systems:  Negative except as otherwise mentioned in HPI.  Physical Exam: Vitals:   07/31/2019 1130 08/20/2019 1430 07/31/2019 1500 08/20/2019 1530  BP: 128/90 119/90 (!) 132/91 125/83  Pulse: (!) 124 (!) 115 (!) 122 (!) 115  Resp: (!) 33 (!) 35 (!) 33 20  Temp:      TempSrc:      SpO2: 100% 100% 98% 98%  Weight:  Height:        Constitutional: Positive tachypnea, mild respiratory distress, Without nausea, on 2 L [place] supplementation. Patient is afebrile. Eyes: PERRL, lids and conjunctivae normal, no icterus, no nystagmus. ENMT: Mucous membranes are moist. Posterior pharynx clear of any exudate or lesions. Neck: normal, supple, no masses, no thyromegaly, no JVD. Respiratory: Good breath sounds on the left, multiple breath sounds appreciated.  Positive rhonchi; good  air movement otherwise clear on auscultation of the right side. Cardiovascular: sinus tachycardia, no murmurs / rubs or gallops. No extremity edema. 2+ pedal pulses. No carotid bruits.  Abdomen: no tenderness, no masses palpated. No hepatosplenomegaly. Bowel sounds positive.  Musculoskeletal: no clubbing / cyanosis. No joint deformity upper and lower extremities. Good ROM, no contractures. Normal muscle tone.  Skin: no rashes, lesions, ulcers. No induration Neurologic: CN 2-12 grossly intact. Sensation intact, DTR normal. Strength 5/5 in all 4.  Psychiatric: Normal judgment and insight. Alert and oriented x 3. Normal mood.    Labs on Admission: I have personally reviewed the following labs and imaging studies  CBC: Recent Labs  Lab 08/18/2019 0946  WBC 15.5*  NEUTROABS 13.8*  HGB 15.3  HCT 46.9  MCV 92.7  PLT 176   Basic Metabolic Panel: Recent Labs  Lab 08/18/2019 0946  NA 139  K 4.2  CL 90*  CO2 32  GLUCOSE 141*  BUN 43*  CREATININE 1.23  CALCIUM 13.9*  MG 2.3  PHOS 3.3   GFR: Estimated Creatinine Clearance: 71.3 mL/min (by C-G formula based on SCr of 1.23 mg/dL).  Thyroid Function Tests: Recent Labs    08/14/2019 0946  TSH 0.580    Recent Results (from the past 240 hour(s))  Gram stain     Status: None   Collection Time: 08/17/2019 10:56 AM   Specimen: PATH Cytology Peritoneal fluid  Result Value Ref Range Status   Specimen Description PLEURAL  Final   Gram Stain   Final    NO ORGANISMS SEEN WBC PRESENT, PREDOMINANTLY MONONUCLEAR CYTOSPIN SMEAR Performed at [facility], [address] [place], [place] [address]    Report Status 07/31/2019 FINAL  Final     Radiological Exams on Admission: DG Chest Port 1 View  Result Date: 08/24/2019 CLINICAL DATA:  Shortness of breath.  Lung carcinoma EXAM: PORTABLE CHEST 1 VIEW COMPARISON:  August 08, 2019 FINDINGS: There remains essentially complete opacification of the left hemithorax with shift of heart and  mediastinum toward the right. Right lung remains clear. Heart size is grossly normal. No adenopathy evident on the right. Note that the left hilar and mediastinal regions are obscured by the opacification. No bone lesions. IMPRESSION: Complete opacification of the left hemithorax with shift of heart and mediastinum toward the right. Suspect large pleural effusion on the left. There may well be underlying mass and/or consolidation on the left. Right lung clear. Grossly stable cardiac silhouette. Electronically Signed   By: Lowella Grip III M.D.   On: 07/30/2019 09:53   DG Chest Port 1V same Day  Result Date: 08/23/2019 CLINICAL DATA:  Post left-sided thoracentesis. EXAM: PORTABLE CHEST 1 VIEW COMPARISON:  Earlier same day; 08/08/2019; 08/07/2019; chest CT-07/12/2019 FINDINGS: Persistent complete opacification of the left hemithorax following ultrasound-guided thoracentesis. There is persistent deviation of the mediastinal structures to the right. Pulmonary venous congestion within the aerated right lung without new focal airspace opacity. No pneumothorax. No acute osseous abnormalities. IMPRESSION: Persistent complete opacification of the left hemithorax following thoracentesis. No pneumothorax. Electronically Signed   By: Jenny Reichmann  Watts M.D.   On: 08/24/2019 11:29   US THORACENTESIS ASP PLEURAL SPACE W/IMG GUIDE  Result Date: 08/24/2019 INDICATION: Lung Ca Recurrent pleural effusion SOB EXAM: ULTRASOUND GUIDED Left THORACENTESIS MEDICATIONS: 10 cc 1% lidocaine. COMPLICATIONS: None immediate. PROCEDURE: An ultrasound guided thoracentesis was thoroughly discussed with the patient and questions answered. The benefits, risks, alternatives and complications were also discussed. The patient understands and wishes to proceed with the procedure. Written consent was obtained. Ultrasound was performed to localize and mark an adequate pocket of fluid in the left chest. The area was then prepped and draped in the  normal sterile fashion. 1% Lidocaine was used for local anesthesia. Under ultrasound guidance a 19 g Yueh catheter was introduced. Thoracentesis was performed. The catheter was removed and a dressing applied. FINDINGS: A total of approximately 1.5 liters of bloody fluid was removed. Samples were sent to the laboratory as requested by the clinical team. IMPRESSION: Successful ultrasound guided left thoracentesis yielding 1.5 liters of pleural fluid. Read by Lavonia Drafts [facility] Electronically Signed   By: Sandi Mariscal M.D.   On: 08/08/2019 11:17    EKG: Independently reviewed.  Sinus tachycardia,low voltage appreciated throughout; Poor wave progression.  Assessment/Plan 1-recurrent malignant Pleural effusion on left -Recent diagnosis of bronchogenic carcinoma -With his that admission secondary to shortness of breath and mild hypoxia associated with recurrent pleural effusion. -No fever -Cultures taken during thoracentesis in the ED to pursued any underlying infection process. -Case has been discussed with cardiothoracic surgeon who is planning to pursue pleurodesis on 08/20/2019. -Patient will be transferred to [facility] for evaluation and management. -Continue supportive care and oxygen supplementation.  2-hypocalcemia -Malignant in nature -Calcium level 13.9 -Prolia x1 given -HCTZ discontinued -Fluid resuscitation will be started volume status reassessed on 08/14/2019 with intention to pursuit Lasix management. -Will follow electrolytes trend -Will check magnesium, phosphorus, TSH and vitamin D level.  3-left lung mass -Continue outpatient follow-up with oncology service to complete the staging process and chemotherapy initiation.  4-hypertension -Continue lisinopril -ACT COPD home in the setting of hypercalcemia -Heart healthy diet has been ordered -Follow vital signs and further adjust antihypertensive treatment as needed.  5-history of COVID-19 infection -Patient is  exactly 21 days out from positive test for Covid -no URI Symptoms -no fever -x-ray with positive recurrent pleural effusion -continue vit c and zinc -universal precaution to be follow  6-glaucoma -continue Travatan Z equivalent  7-HLD -will continue pravachol    DVT prophylaxis: Lovenox Code Status: Full code Family Communication: No family at bedside. Disposition Plan: Anticipate discharge for home once respiratory status is stabilized and his calcium level corrected. Consults called: cardiothoracic surgery (Dr. Roxan Hockey; with plans for pleurodesis tomorrow). Admission status: inpatient, LOS > 2 midnights, telemetry bed.   Time Spent: 70 minutes.  Barton Dubois MD [facility] Pager [phone]   08/14/2019, 3:49 PM

## 2019-08-15 NOTE — ED Provider Notes (Signed)
[facility] EMERGENCY DEPARTMENT Provider Note   CSN: [id number] Arrival date & time: 08/10/2019  0630     History Chief Complaint  Patient presents with  . Shortness of Breath    [name] is a 64 y.o. male.  [name] is a 64 y.o. male with a history of recently diagnosed lung cancer with recurrent left pleural effusion, hypertension, hyperlipidemia, and recent COVID-19 infection, who presents to the ED for evaluation of shortness of breath.  Patient reports short breathing has been worsening over the past few days.  He reports at first it was only with exertion and is now also present at rest.  He recently had admission on 2/10 for similar and was found to have reaccumulation of left-sided pleural effusion, had thoracentesis with 1.2 L drained and had significant improvement afterwards and was discharged home.  He reports this feels similar to this recent episode, he had similar episode also on 1/27.  At this time he was also found to have Covid infection but aside from symptoms of his shortness of breath from pleural effusion he did not have any other symptoms from Covid.  He has a chronic mild cough that is occasionally productive which is unchanged.  He denies any associated fevers.  He has not had any chest pain.  He has had issues with some chronic and unchanged left lower abdominal pain.  This is not worsened or changed at all today.  No associated vomiting or changes in bowels.  He is followed by Dr. Delton Coombes with oncology pending complete work-up of this new lung mass        Past Medical History:  Diagnosis Date  . ED (erectile dysfunction)   . Hyperlipidemia   . Hypertension   . Lung cancer [facility])     Patient Active Problem List   Diagnosis Date Noted  . Recurrent left pleural effusion 08/07/2019  . COVID-19 virus infection 08/07/2019  . Mass of upper lobe of left lung 07/15/2019  . Essential hypertension, benign 07/12/2013  . Other and unspecified  hyperlipidemia 07/12/2013  . Plantar fasciitis of left foot 07/12/2013    Past Surgical History:  Procedure Laterality Date  . APPENDECTOMY    . COLONOSCOPY         No family history on file.  Social History   Tobacco Use  . Smoking status: Current Some Day Smoker  . Smokeless tobacco: Never Used  Substance Use Topics  . Alcohol use: Not on file  . Drug use: Not on file    Home Medications Prior to Admission medications   Medication Sig Start Date End Date Taking? Authorizing Provider  ascorbic acid (VITAMIN C) 500 MG tablet Take 500 mg by mouth every morning.    [provider]  aspirin EC 81 MG tablet Take 81 mg by mouth every morning.     [provider]  hydrochlorothiazide (HYDRODIURIL) 25 MG tablet One tablet daily in morning Patient taking differently: Take 25 mg by mouth every morning.  03/11/19   Mikey Kirschner, MD  HYDROcodone-acetaminophen (NORCO/VICODIN) 5-325 MG tablet Take 1 tablet by mouth every 8 (eight) hours as needed for moderate pain. 08/05/19   Derek Jack, MD  lisinopril (ZESTRIL) 40 MG tablet Take 1 tablet (40 mg total) by mouth daily. Patient taking differently: Take 40 mg by mouth every evening.  03/11/19   Mikey Kirschner, MD  Multiple Vitamin (MULTIVITAMIN) tablet Take 1 tablet by mouth every morning.     [provider]  pravastatin (PRAVACHOL) 20 MG tablet TAKE 1 TABLET (20 MG TOTAL) BY MOUTH DAILY. Patient taking differently: Take 20 mg by mouth every evening.  03/11/19   Mikey Kirschner, MD  TRAVATAN Z 0.004 % SOLN ophthalmic solution Place 1 drop into both eyes at bedtime.  10/05/15   [provider]    Allergies    Patient has no known allergies.  Review of Systems   Review of Systems  Constitutional: Negative for chills and fever.  HENT: Negative.   Respiratory: Positive for cough (Chronic, unchanged) and shortness of breath.   Cardiovascular: Negative for chest pain.  Gastrointestinal:  Positive for abdominal pain. Negative for diarrhea, nausea and vomiting.  Musculoskeletal: Negative for arthralgias, back pain and myalgias.  Skin: Negative for color change and rash.  Neurological: Negative for dizziness, syncope and light-headedness.  All other systems reviewed and are negative.   Physical Exam Updated Vital Signs BP 127/85 (BP Location: Left Arm)   Pulse (!) 131   Temp 97.8 F (36.6 C) (Oral)   Resp (!) 32   Ht 6\' 1"  (1.854 m)   Wt 95.3 kg   SpO2 95%   BMI 27.72 kg/m   Physical Exam Vitals and nursing note reviewed.  Constitutional:      General: He is not in acute distress.    Appearance: He is well-developed. He is not diaphoretic.     Comments: Patient in no acute distress  HENT:     Head: Normocephalic and atraumatic.  Eyes:     General:        Right eye: No discharge.        Left eye: No discharge.     Pupils: Pupils are equal, round, and reactive to light.  Cardiovascular:     Rate and Rhythm: Regular rhythm. Tachycardia present.     Heart sounds: Normal heart sounds. No murmur. No friction rub.     Comments: Sinus tachycardia with regular rhythm Pulmonary:     Effort: Tachypnea present. No respiratory distress.     Comments: Patient is tachypneic with some increased respiratory effort but he is in no acute respiratory distress, satting well on room air.  Right lung sounds clear with some decreased breath sounds, lung sounds absent over the entire left lung Abdominal:     General: Bowel sounds are normal. There is no distension.     Palpations: Abdomen is soft. There is no mass.     Tenderness: There is abdominal tenderness. There is no guarding.     Comments: Abdomen is soft and nondistended, there is some minimal tenderness in the left lower quadrant which patient reports is unchanged, no guarding or rebound tenderness  Musculoskeletal:        General: No deformity.     Cervical back: Neck supple.     Right lower leg: No edema.     Left  lower leg: No edema.  Skin:    General: Skin is warm and dry.     Capillary Refill: Capillary refill takes less than 2 seconds.  Neurological:     Mental Status: He is alert.     Coordination: Coordination normal.     Comments: Speech is clear, able to follow commands Moves extremities without ataxia, coordination intact  Psychiatric:        Mood and Affect: Mood normal.        Behavior: Behavior normal.     ED Results / Procedures / Treatments   Labs (all  labs ordered are listed, but only abnormal results are displayed) Labs Reviewed  BRAIN NATRIURETIC PEPTIDE - Abnormal; Notable for the following components:      Result Value   B Natriuretic Peptide 178.0 (*)    All other components within normal limits  BASIC METABOLIC PANEL - Abnormal; Notable for the following components:   Chloride 90 (*)    Glucose, Bld 141 (*)    BUN 43 (*)    Calcium 13.9 (*)    Anion gap 17 (*)    All other components within normal limits  CBC WITH DIFFERENTIAL/PLATELET - Abnormal; Notable for the following components:   WBC 15.5 (*)    Neutro Abs 13.8 (*)    Lymphs Abs 0.6 (*)    Monocytes Absolute 1.1 (*)    All other components within normal limits  GRAM STAIN  BODY FLUID CULTURE    EKG EKG Interpretation  Date/Time:  Thursday August 15 2019 09:06:54 EST Ventricular Rate:  134 PR Interval:    QRS Duration: 64 QT Interval:  276 QTC Calculation: 412 R Axis:   61 Text Interpretation: Sinus tachycardia Low voltage, extremity and precordial leads Anteroseptal infarct, old No significant change since last tracing Confirmed by Dorie Rank [id number]) on 08/14/2019 9:21:48 AM   Radiology DG Chest Port 1 View  Result Date: 08/01/2019 CLINICAL DATA:  Shortness of breath.  Lung carcinoma EXAM: PORTABLE CHEST 1 VIEW COMPARISON:  August 08, 2019 FINDINGS: There remains essentially complete opacification of the left hemithorax with shift of heart and mediastinum toward the right. Right lung  remains clear. Heart size is grossly normal. No adenopathy evident on the right. Note that the left hilar and mediastinal regions are obscured by the opacification. No bone lesions. IMPRESSION: Complete opacification of the left hemithorax with shift of heart and mediastinum toward the right. Suspect large pleural effusion on the left. There may well be underlying mass and/or consolidation on the left. Right lung clear. Grossly stable cardiac silhouette. Electronically Signed   By: Lowella Grip III M.D.   On: 08/01/2019 09:53   DG Chest Port 1V same Day  Result Date: 08/09/2019 CLINICAL DATA:  Post left-sided thoracentesis. EXAM: PORTABLE CHEST 1 VIEW COMPARISON:  Earlier same day; 08/08/2019; 08/07/2019; chest CT-07/12/2019 FINDINGS: Persistent complete opacification of the left hemithorax following ultrasound-guided thoracentesis. There is persistent deviation of the mediastinal structures to the right. Pulmonary venous congestion within the aerated right lung without new focal airspace opacity. No pneumothorax. No acute osseous abnormalities. IMPRESSION: Persistent complete opacification of the left hemithorax following thoracentesis. No pneumothorax. Electronically Signed   By: Sandi Mariscal M.D.   On: 07/29/2019 11:29   US THORACENTESIS ASP PLEURAL SPACE W/IMG GUIDE  Result Date: 08/25/2019 INDICATION: Lung Ca Recurrent pleural effusion SOB EXAM: ULTRASOUND GUIDED Left THORACENTESIS MEDICATIONS: 10 cc 1% lidocaine. COMPLICATIONS: None immediate. PROCEDURE: An ultrasound guided thoracentesis was thoroughly discussed with the patient and questions answered. The benefits, risks, alternatives and complications were also discussed. The patient understands and wishes to proceed with the procedure. Written consent was obtained. Ultrasound was performed to localize and mark an adequate pocket of fluid in the left chest. The area was then prepped and draped in the normal sterile fashion. 1% Lidocaine was used  for local anesthesia. Under ultrasound guidance a 19 g Yueh catheter was introduced. Thoracentesis was performed. The catheter was removed and a dressing applied. FINDINGS: A total of approximately 1.5 liters of bloody fluid was removed. Samples were sent to the laboratory as  requested by the clinical team. IMPRESSION: Successful ultrasound guided left thoracentesis yielding 1.5 liters of pleural fluid. Read by Lavonia Drafts [facility] Electronically Signed   By: Sandi Mariscal M.D.   On: 08/21/2019 11:17    Procedures Procedures (including critical care time)  Medications Ordered in ED Medications  denosumab (PROLIA) injection 60 mg (has no administration in time range)  0.9 %  sodium chloride infusion (has no administration in time range)  ascorbic acid (VITAMIN C) tablet 500 mg (has no administration in time range)  aspirin EC tablet 81 mg (has no administration in time range)  lisinopril (ZESTRIL) tablet 40 mg (has no administration in time range)  multivitamin with minerals tablet 1 tablet (has no administration in time range)  latanoprost (XALATAN) 0.005 % ophthalmic solution 1 drop (has no administration in time range)  pravastatin (PRAVACHOL) tablet 20 mg (has no administration in time range)  enoxaparin (LOVENOX) injection 40 mg (has no administration in time range)  acetaminophen (TYLENOL) tablet 650 mg (has no administration in time range)    Or  acetaminophen (TYLENOL) suppository 650 mg (has no administration in time range)  ondansetron (ZOFRAN) tablet 4 mg (has no administration in time range)    Or  ondansetron (ZOFRAN) injection 4 mg (has no administration in time range)  sodium chloride 0.9 % bolus 1,000 mL (1,000 mLs Intravenous New Bag/Given 08/20/2019 1217)    ED Course  I have reviewed the triage vital signs and the nursing notes.  Pertinent labs & imaging results that were available during my care of the patient were reviewed by me and considered in my medical decision making  (see chart for details).    MDM Rules/Calculators/A&P                       64 year old male with history of recently diagnosed lung cancer and recurrent pleural effusion, who presents to the ED for worsening shortness of breath, breathing has been worsening over the past week especially with activity.  On arrival he is tachycardic and tachypneic but is satting well on room air.  On exam, lung sounds are absent over the left and I suspect reaccumulation of pleural effusion.  Will get chest x-ray basic labs, BNP and EKG.  Chest x-ray confirms complete opacification of the left hemithorax consistent with reaccumulation of left pleural effusion, this looks worse than on previous, patient has rightward shift of the heart and mediastinum.  Given shift noted feel patient needs more emergent thoracentesis, will see if anyone is available here today from IR otherwise we will attempt thoracentesis here in the ED.  Feel patient would benefit on a more long-term basis from pleurodesis or Pleurx catheter given that this is his third ED visit in less than a month for pleural effusion.  It looks like patient was referred to CT surgery after his last hospital admission but has not yet been seen.  10:05 AM Case discussed with PA Jannifer Franklin with interventional radiology who is available to do ultrasound-guided thoracentesis at bedside in the ED, because there is not a radiologist present today she wanted to call to confirm that there is an ED doctor present to place chest tube if there is any complication and pneumothorax develops, Dr. Tomi Bamberger is present and comfortable with this plan.  Patient will be taken for ultrasound-guided thoracentesis.  He has already had analysis of pleural fluid after first thoracentesis, will send for Gram stain and culture given multiple interventions within the  pleural space to ensure there is no developing infection.  Lab work shows leukocytosis of 15.5, no associated fever, normal  hemoglobin.  Patient with hypercalcemia of 13.9 worse than previous, glucose of 141, no other significant electrolyte derangements, creatinine of 1.23.  BNP is minimally elevated.  EKG shows sinus tachycardia.  IV fluids ordered for hypercalcemia.  Bedside ultrasound-guided thoracentesis completed by PA Jannifer Franklin, postprocedural x-ray shows no evidence of pneumothorax, persistent opacification noted which has been seen after both of patient's previous thoracentesis.  Case discussed with Dr. Dyann Kief with [facility] hospitalist for admission for recurrent pleural effusion and hypercalcemia.  He recommends giving full 1 L fluid bolus for hypercalcemia he will order additional medications to help with hypercalcemia.  Agrees that patient would likely benefit from admission at [facility] and potential CT surgery consultation given recurrent pleural effusions, reaccumulating now in less than a week   Final Clinical Impression(s) / ED Diagnoses Final diagnoses:  Pleural effusion  Hypercalcemia    Rx / DC Orders ED Discharge Orders    None       Janet Berlin 07/29/2019 1239    Dorie Rank, MD 08/18/2019 [phone]

## 2019-08-15 NOTE — ED Notes (Signed)
Thoracentesis completed at this time at bedside by IR department.

## 2019-08-15 NOTE — Progress Notes (Signed)
Pt arrived to 4e from [facility]. Pt oriented to room and staff. Telemetry box applied and CCMD notified x2 verifiers. Vitals obtained. RR and HR elevated, which has been pt's baseline. MD aware. CHG bath completed.

## 2019-08-16 ENCOUNTER — Inpatient Hospital Stay (HOSPITAL_COMMUNITY): Payer: BC Managed Care – PPO | Admitting: Certified Registered Nurse Anesthetist

## 2019-08-16 ENCOUNTER — Inpatient Hospital Stay (HOSPITAL_COMMUNITY): Payer: BC Managed Care – PPO

## 2019-08-16 ENCOUNTER — Encounter (HOSPITAL_COMMUNITY): Payer: Self-pay | Admitting: Internal Medicine

## 2019-08-16 ENCOUNTER — Ambulatory Visit (HOSPITAL_COMMUNITY): Admission: RE | Admit: 2019-08-16 | Payer: BC Managed Care – PPO | Source: Ambulatory Visit

## 2019-08-16 ENCOUNTER — Inpatient Hospital Stay: Payer: BC Managed Care – PPO | Admitting: Family Medicine

## 2019-08-16 ENCOUNTER — Encounter (HOSPITAL_COMMUNITY): Admission: EM | Disposition: E | Payer: Self-pay | Source: Home / Self Care | Attending: Internal Medicine

## 2019-08-16 DIAGNOSIS — J90 Pleural effusion, not elsewhere classified: Secondary | ICD-10-CM | POA: Diagnosis not present

## 2019-08-16 DIAGNOSIS — C380 Malignant neoplasm of heart: Secondary | ICD-10-CM | POA: Diagnosis not present

## 2019-08-16 DIAGNOSIS — Z7189 Other specified counseling: Secondary | ICD-10-CM | POA: Diagnosis not present

## 2019-08-16 DIAGNOSIS — C3492 Malignant neoplasm of unspecified part of left bronchus or lung: Secondary | ICD-10-CM | POA: Diagnosis not present

## 2019-08-16 DIAGNOSIS — R579 Shock, unspecified: Secondary | ICD-10-CM | POA: Diagnosis not present

## 2019-08-16 DIAGNOSIS — I10 Essential (primary) hypertension: Secondary | ICD-10-CM | POA: Diagnosis not present

## 2019-08-16 DIAGNOSIS — J9600 Acute respiratory failure, unspecified whether with hypoxia or hypercapnia: Secondary | ICD-10-CM | POA: Diagnosis not present

## 2019-08-16 DIAGNOSIS — C384 Malignant neoplasm of pleura: Secondary | ICD-10-CM | POA: Diagnosis not present

## 2019-08-16 DIAGNOSIS — Z4682 Encounter for fitting and adjustment of non-vascular catheter: Secondary | ICD-10-CM | POA: Diagnosis not present

## 2019-08-16 DIAGNOSIS — U071 COVID-19: Secondary | ICD-10-CM | POA: Diagnosis not present

## 2019-08-16 DIAGNOSIS — R570 Cardiogenic shock: Secondary | ICD-10-CM | POA: Diagnosis not present

## 2019-08-16 DIAGNOSIS — J910 Malignant pleural effusion: Secondary | ICD-10-CM | POA: Diagnosis not present

## 2019-08-16 DIAGNOSIS — Z515 Encounter for palliative care: Secondary | ICD-10-CM | POA: Diagnosis not present

## 2019-08-16 DIAGNOSIS — R911 Solitary pulmonary nodule: Secondary | ICD-10-CM | POA: Diagnosis not present

## 2019-08-16 DIAGNOSIS — C3490 Malignant neoplasm of unspecified part of unspecified bronchus or lung: Secondary | ICD-10-CM | POA: Diagnosis not present

## 2019-08-16 DIAGNOSIS — R609 Edema, unspecified: Secondary | ICD-10-CM | POA: Diagnosis not present

## 2019-08-16 HISTORY — PX: CHEST TUBE INSERTION: SHX231

## 2019-08-16 LAB — BASIC METABOLIC PANEL
Anion gap: 13 (ref 5–15)
BUN: 41 mg/dL — ABNORMAL HIGH (ref 8–23)
CO2: 29 mmol/L (ref 22–32)
Calcium: 13.4 mg/dL (ref 8.9–10.3)
Chloride: 96 mmol/L — ABNORMAL LOW (ref 98–111)
Creatinine, Ser: 1.09 mg/dL (ref 0.61–1.24)
GFR calc Af Amer: >60 mL/min (ref >60)
GFR calc non Af Amer: >60 mL/min (ref >60)
Glucose, Bld: 105 mg/dL — ABNORMAL HIGH (ref 70–99)
Potassium: 4.6 mmol/L (ref 3.5–5.1)
Sodium: 138 mmol/L (ref 135–145)

## 2019-08-16 LAB — CBC
HCT: 42.5 % (ref 39.0–52.0)
HCT: 41.8 % (ref 39.0–52.0)
Hemoglobin: 14.2 g/dL (ref 13.0–17.0)
Hemoglobin: 13.4 g/dL (ref 13.0–17.0)
MCH: 30.4 pg (ref 26.0–34.0)
MCH: 30 pg (ref 26.0–34.0)
MCHC: 33.4 g/dL (ref 30.0–36.0)
MCHC: 32.1 g/dL (ref 30.0–36.0)
MCV: 91 fL (ref 80.0–100.0)
MCV: 93.5 fL (ref 80.0–100.0)
Platelets: 236 10*3/uL (ref 150–400)
Platelets: 214 10*3/uL (ref 150–400)
RBC: 4.67 MIL/uL (ref 4.22–5.81)
RBC: 4.47 MIL/uL (ref 4.22–5.81)
RDW: 14.1 % (ref 11.5–15.5)
RDW: 14.3 % (ref 11.5–15.5)
WBC: 14.5 10*3/uL — ABNORMAL HIGH (ref 4.0–10.5)
WBC: 14.1 10*3/uL — ABNORMAL HIGH (ref 4.0–10.5)
nRBC: 0 % (ref 0.0–0.2)
nRBC: 0 % (ref 0.0–0.2)

## 2019-08-16 LAB — PROTIME-INR
INR: 1.3 — ABNORMAL HIGH (ref 0.8–1.2)
Prothrombin Time: 16 seconds — ABNORMAL HIGH (ref 11.4–15.2)

## 2019-08-16 LAB — SURGICAL PCR SCREEN
MRSA, PCR: NEGATIVE
Staphylococcus aureus: NEGATIVE

## 2019-08-16 LAB — GLUCOSE, CAPILLARY: Glucose-Capillary: 101 mg/dL — ABNORMAL HIGH (ref 70–99)

## 2019-08-16 LAB — APTT: aPTT: 27 seconds (ref 24–36)

## 2019-08-16 SURGERY — INSERTION, PLEURAL DRAINAGE CATHETER
Anesthesia: Monitor Anesthesia Care | Site: Chest | Laterality: Left

## 2019-08-16 MED ORDER — ENSURE ENLIVE PO LIQD
237.0000 mL | Freq: Two times a day (BID) | ORAL | Status: DC
  Administered 2019-08-17 – 2019-08-19 (×5): 237 mL via ORAL

## 2019-08-16 MED ORDER — PROPOFOL 500 MG/50ML IV EMUL
INTRAVENOUS | Status: DC | PRN
  Administered 2019-08-16: 50 ug/kg/min via INTRAVENOUS

## 2019-08-16 MED ORDER — CHLORHEXIDINE GLUCONATE CLOTH 2 % EX PADS
6.0000 | MEDICATED_PAD | Freq: Every day | CUTANEOUS | Status: DC
  Administered 2019-08-16 – 2019-08-19 (×4): 6 via TOPICAL

## 2019-08-16 MED ORDER — MIDAZOLAM HCL 2 MG/2ML IJ SOLN
INTRAMUSCULAR | Status: AC
  Filled 2019-08-16: qty 2

## 2019-08-16 MED ORDER — 0.9 % SODIUM CHLORIDE (POUR BTL) OPTIME
TOPICAL | Status: DC | PRN
  Administered 2019-08-16: 1000 mL

## 2019-08-16 MED ORDER — FENTANYL CITRATE (PF) 250 MCG/5ML IJ SOLN
INTRAMUSCULAR | Status: DC | PRN
  Administered 2019-08-16: 50 ug via INTRAVENOUS

## 2019-08-16 MED ORDER — ALBUMIN HUMAN 5 % IV SOLN
INTRAVENOUS | Status: AC
  Filled 2019-08-16: qty 250

## 2019-08-16 MED ORDER — TRAMADOL HCL 50 MG PO TABS
100.0000 mg | ORAL_TABLET | Freq: Four times a day (QID) | ORAL | Status: DC | PRN
  Administered 2019-08-19 – 2019-08-21 (×4): 100 mg via ORAL
  Filled 2019-08-16 (×4): qty 2

## 2019-08-16 MED ORDER — FENTANYL CITRATE (PF) 100 MCG/2ML IJ SOLN: 25.0000 ug | INTRAMUSCULAR | Status: DC | PRN

## 2019-08-16 MED ORDER — ZOLEDRONIC ACID 4 MG/100ML IV SOLN
4.0000 mg | Freq: Once | INTRAVENOUS | Status: DC
  Filled 2019-08-16: qty 100

## 2019-08-16 MED ORDER — PHENYLEPHRINE 40 MCG/ML (10ML) SYRINGE FOR IV PUSH (FOR BLOOD PRESSURE SUPPORT)
PREFILLED_SYRINGE | INTRAVENOUS | Status: AC
  Filled 2019-08-16: qty 10

## 2019-08-16 MED ORDER — PHENYLEPHRINE HCL-NACL 10-0.9 MG/250ML-% IV SOLN
0.0000 ug/min | INTRAVENOUS | Status: DC
  Administered 2019-08-16: 11:00:00 30 ug/min via INTRAVENOUS
  Filled 2019-08-16: qty 250

## 2019-08-16 MED ORDER — ZOLEDRONIC ACID 4 MG/5ML IV CONC
4.0000 mg | Freq: Once | INTRAVENOUS | Status: DC
  Filled 2019-08-16: qty 5

## 2019-08-16 MED ORDER — LACTATED RINGERS IV SOLN: INTRAVENOUS | Status: DC | PRN

## 2019-08-16 MED ORDER — ONDANSETRON HCL 4 MG/2ML IJ SOLN
INTRAMUSCULAR | Status: AC
  Filled 2019-08-16: qty 2

## 2019-08-16 MED ORDER — ONDANSETRON HCL 4 MG/2ML IJ SOLN: 4.0000 mg | Freq: Once | INTRAMUSCULAR | Status: DC | PRN

## 2019-08-16 MED ORDER — LIDOCAINE 2% (20 MG/ML) 5 ML SYRINGE
INTRAMUSCULAR | Status: DC | PRN
  Administered 2019-08-16: 50 mg via INTRAVENOUS

## 2019-08-16 MED ORDER — LIDOCAINE HCL 1 % IJ SOLN
INTRAMUSCULAR | Status: DC | PRN
  Administered 2019-08-16: 8 mL

## 2019-08-16 MED ORDER — ONDANSETRON HCL 4 MG/2ML IJ SOLN
INTRAMUSCULAR | Status: DC | PRN
  Administered 2019-08-16: 4 mg via INTRAVENOUS

## 2019-08-16 MED ORDER — CALCITONIN (SALMON) 200 UNIT/ACT NA SOLN
1.0000 | Freq: Two times a day (BID) | NASAL | Status: DC
  Administered 2019-08-16 – 2019-08-17 (×2): 1 via NASAL
  Filled 2019-08-16: qty 3.7

## 2019-08-16 MED ORDER — LIDOCAINE 2% (20 MG/ML) 5 ML SYRINGE
INTRAMUSCULAR | Status: AC
  Filled 2019-08-16: qty 5

## 2019-08-16 MED ORDER — CEFAZOLIN SODIUM-DEXTROSE 2-4 GM/100ML-% IV SOLN
2.0000 g | INTRAVENOUS | Status: AC
  Administered 2019-08-16: 09:00:00 2 g via INTRAVENOUS
  Filled 2019-08-16 (×2): qty 100

## 2019-08-16 MED ORDER — ALBUMIN HUMAN 5 % IV SOLN
INTRAVENOUS | Status: AC
  Administered 2019-08-16: 11:00:00 12.5 g via INTRAVENOUS
  Filled 2019-08-16: qty 250

## 2019-08-16 MED ORDER — ALBUMIN HUMAN 5 % IV SOLN: 12.5000 g | Freq: Once | INTRAVENOUS | Status: AC

## 2019-08-16 MED ORDER — PHENYLEPHRINE HCL-NACL 10-0.9 MG/250ML-% IV SOLN
0.0000 ug/min | INTRAVENOUS | Status: DC
  Administered 2019-08-16: 21:00:00 55 ug/min via INTRAVENOUS
  Administered 2019-08-16: 15:00:00 30 ug/min via INTRAVENOUS
  Administered 2019-08-16: 17:00:00 40 ug/min via INTRAVENOUS
  Administered 2019-08-17: 17:00:00 60 ug/min via INTRAVENOUS
  Administered 2019-08-17: 15:00:00 80 ug/min via INTRAVENOUS
  Administered 2019-08-17 (×2): 60 ug/min via INTRAVENOUS
  Administered 2019-08-17: 14:00:00 80 ug/min via INTRAVENOUS
  Administered 2019-08-17 (×2): 70 ug/min via INTRAVENOUS
  Administered 2019-08-17: 10:00:00 55 ug/min via INTRAVENOUS
  Administered 2019-08-18: 06:00:00 60 ug/min via INTRAVENOUS
  Administered 2019-08-18: 03:00:00 70 ug/min via INTRAVENOUS
  Administered 2019-08-18: 65 ug/min via INTRAVENOUS
  Administered 2019-08-18: 12:00:00 60 ug/min via INTRAVENOUS
  Administered 2019-08-19: 05:00:00 70 ug/min via INTRAVENOUS
  Administered 2019-08-19: 60 ug/min via INTRAVENOUS
  Administered 2019-08-19: 03:00:00 75 ug/min via INTRAVENOUS
  Administered 2019-08-19: 60 ug/min via INTRAVENOUS
  Administered 2019-08-19: 01:00:00 75 ug/min via INTRAVENOUS
  Administered 2019-08-21: 20:00:00 20 ug/min via INTRAVENOUS
  Filled 2019-08-16: qty 250
  Filled 2019-08-16: qty 750
  Filled 2019-08-16 (×3): qty 250
  Filled 2019-08-16: qty 500
  Filled 2019-08-16 (×3): qty 250
  Filled 2019-08-16: qty 500
  Filled 2019-08-16: qty 250
  Filled 2019-08-16 (×3): qty 500
  Filled 2019-08-16 (×2): qty 250

## 2019-08-16 MED ORDER — ALBUMIN HUMAN 5 % IV SOLN
12.5000 g | Freq: Once | INTRAVENOUS | Status: AC
  Administered 2019-08-16: 12.5 g via INTRAVENOUS

## 2019-08-16 MED ORDER — PHENYLEPHRINE 40 MCG/ML (10ML) SYRINGE FOR IV PUSH (FOR BLOOD PRESSURE SUPPORT)
PREFILLED_SYRINGE | INTRAVENOUS | Status: DC | PRN
  Administered 2019-08-16 (×2): 80 ug via INTRAVENOUS
  Administered 2019-08-16: 120 ug via INTRAVENOUS

## 2019-08-16 MED ORDER — LACTATED RINGERS IV BOLUS
750.0000 mL | Freq: Once | INTRAVENOUS | Status: AC
  Administered 2019-08-16: 12:00:00 750 mL via INTRAVENOUS

## 2019-08-16 MED ORDER — FENTANYL CITRATE (PF) 250 MCG/5ML IJ SOLN
INTRAMUSCULAR | Status: AC
  Filled 2019-08-16: qty 5

## 2019-08-16 MED ORDER — MIDAZOLAM HCL 5 MG/5ML IJ SOLN
INTRAMUSCULAR | Status: DC | PRN
  Administered 2019-08-16: 1 mg via INTRAVENOUS

## 2019-08-16 MED ORDER — PROPOFOL 10 MG/ML IV BOLUS
INTRAVENOUS | Status: AC
  Filled 2019-08-16: qty 20

## 2019-08-16 SURGICAL SUPPLY — 31 items
ADH SKN CLS APL DERMABOND .7 (GAUZE/BANDAGES/DRESSINGS) ×1
BLADE CLIPPER SURG (BLADE) ×3 IMPLANT
CANISTER SUCT 3000ML PPV (MISCELLANEOUS) ×3 IMPLANT
COVER SURGICAL LIGHT HANDLE (MISCELLANEOUS) ×3 IMPLANT
DERMABOND ADVANCED (GAUZE/BANDAGES/DRESSINGS) ×2
DERMABOND ADVANCED .7 DNX12 (GAUZE/BANDAGES/DRESSINGS) ×1 IMPLANT
DRAPE C-ARM 42X72 X-RAY (DRAPES) ×3 IMPLANT
DRAPE LAPAROSCOPIC ABDOMINAL (DRAPES) ×3 IMPLANT
GLOVE BIOGEL PI IND STRL 7.5 (GLOVE) IMPLANT
GLOVE BIOGEL PI INDICATOR 7.5 (GLOVE) ×2
GLOVE SURG SIGNA 7.5 PF LTX (GLOVE) ×3 IMPLANT
GLOVE TRIUMPH SURG SIZE 7.5 (KITS) ×2 IMPLANT
GOWN STRL REUS W/ TWL LRG LVL3 (GOWN DISPOSABLE) ×1 IMPLANT
GOWN STRL REUS W/ TWL XL LVL3 (GOWN DISPOSABLE) ×1 IMPLANT
GOWN STRL REUS W/TWL LRG LVL3 (GOWN DISPOSABLE) ×3
GOWN STRL REUS W/TWL XL LVL3 (GOWN DISPOSABLE) ×3
KIT BASIN OR (CUSTOM PROCEDURE TRAY) ×3 IMPLANT
KIT PLEURX DRAIN CATH 1000ML (MISCELLANEOUS) ×3 IMPLANT
KIT PLEURX DRAIN CATH 15.5FR (DRAIN) ×3 IMPLANT
KIT TURNOVER KIT B (KITS) ×3 IMPLANT
NS IRRIG 1000ML POUR BTL (IV SOLUTION) ×3 IMPLANT
PACK GENERAL/GYN (CUSTOM PROCEDURE TRAY) ×3 IMPLANT
PAD ARMBOARD 7.5X6 YLW CONV (MISCELLANEOUS) ×6 IMPLANT
SET DRAINAGE LINE (MISCELLANEOUS) IMPLANT
SUT ETHILON 3 0 FSL (SUTURE) ×3 IMPLANT
SUT VIC AB 3-0 X1 27 (SUTURE) ×3 IMPLANT
SYR CONTROL 10ML LL (SYRINGE) ×3 IMPLANT
TOWEL GREEN STERILE (TOWEL DISPOSABLE) ×3 IMPLANT
TOWEL GREEN STERILE FF (TOWEL DISPOSABLE) ×3 IMPLANT
VALVE REPLACEMENT CAP (MISCELLANEOUS) IMPLANT
WATER STERILE IRR 1000ML POUR (IV SOLUTION) ×3 IMPLANT

## 2019-08-16 NOTE — Progress Notes (Signed)
Initial Nutrition Assessment  DOCUMENTATION CODES:   Not applicable  INTERVENTION:   -RD will follow for diet advancement and add supplements as appropriate  NUTRITION DIAGNOSIS:   Increased nutrient needs related to cancer and cancer related treatments, post-op healing as evidenced by estimated needs.  GOAL:   Patient will meet greater than or equal to 90% of their needs  MONITOR:   PO intake, Supplement acceptance, Diet advancement, Weight trends, Labs, Skin, I & O's  REASON FOR ASSESSMENT:   Malnutrition Screening Tool    ASSESSMENT:   [name] is a 64 y.o. male medical history significant for hyperlipidemia, hypertension, left lung bronchogenic carcinoma and recurrent left pleural effusion; who presented to ED with worsening SOB.  Denies any fever, chills, productive cough or chest pain.  He reports symptoms are similar to the last 2 time he has been in the hospital with recurrent pleural effusion.  Shortness of breath has been progressively worsening over the last 3-4 days; of note, he was last discharge on 08/07/2019.  Pt admitted with lt recurrent malignant pleural effusion.   Reviewed I/O's: +1.3 L x 24 hours  Pt currently in OR at time of visit. RD unable to obtain further nutrition-related history or complete nutrition-focused physical exam at this time. Plan for pleural cath placement today.   Reviewed wt hx; noted pt has experienced a 7.6% wt loss over the past month, which is significant for time frame.  Due to weight loss and increased nutritional needs for lung cancer, pt is at high risk for malnutrition, however, RD unable to identify at this time. Pt would greatly benefit from addition of oral nutrition supplements once diet is advanced.   Medications reviewed and include 0.9% sodium chloride infusion @ 75 ml/hr.   Labs reviewed.   Diet Order:   Diet Order            Diet NPO time specified  Diet effective midnight               EDUCATION NEEDS:   No education needs have been identified at this time  Skin:  Skin Assessment: Reviewed RN Assessment  Last BM:  08/12/2019  Height:   Ht Readings from Last 1 Encounters:  08/04/2019 6\' 1"  (1.854 m)    Weight:   Wt Readings from Last 1 Encounters:  08/12/2019 95.3 kg    Ideal Body Weight:  83.6 kg  BMI:  Body mass index is 27.72 kg/m.  Estimated Nutritional Needs:   Kcal:  2300-2500  Protein:  120-135 grams  Fluid:  > 2.3 L    Loistine Chance, RD, LDN, [place] Registered Dietitian II Certified Diabetes Care and Education Specialist Please refer to [facility] for RD and/or RD on-call/weekend/after hours pager

## 2019-08-16 NOTE — Anesthesia Procedure Notes (Signed)
Procedure Name: MAC Date/Time: 07/30/2019 9:05 AM Performed by: Colin Benton, CRNA Pre-anesthesia Checklist: Patient identified, Emergency Drugs available, Suction available and Patient being monitored Patient Re-evaluated:Patient Re-evaluated prior to induction Oxygen Delivery Method: Simple face mask Induction Type: IV induction Placement Confirmation: positive ETCO2 Dental Injury: Teeth and Oropharynx as per pre-operative assessment

## 2019-08-16 NOTE — Transfer of Care (Signed)
Immediate Anesthesia Transfer of Care Note  Patient: [name]  Procedure(s) Performed: INSERTION PLEURAL DRAINAGE CATHETER (Left Chest)  Patient Location: PACU  Anesthesia Type:MAC  Level of Consciousness: awake, alert , oriented and patient cooperative  Airway & Oxygen Therapy: Patient Spontanous Breathing and Patient connected to nasal cannula oxygen  Post-op Assessment: Report given to RN, Post -op Vital signs reviewed and stable and Patient moving all extremities X 4  Post vital signs: Reviewed and stable  Last Vitals:  Vitals Value Taken Time  BP 86/63 08/05/2019 0950  Temp    Pulse 128 08/18/2019 0952  Resp 21 08/08/2019 0952  SpO2 98 % 08/01/2019 0952  Vitals shown include unvalidated device data.  Last Pain:  Vitals:   08/17/2019 0500  TempSrc: Oral  PainSc: 0-No pain      Patients Stated Pain Goal: 0 (15/86/82 5749)  Complications: No apparent anesthesia complications

## 2019-08-16 NOTE — Anesthesia Postprocedure Evaluation (Signed)
Anesthesia Post Note  Patient: [name]  Procedure(s) Performed: INSERTION PLEURAL DRAINAGE CATHETER (Left Chest)     Patient location during evaluation: PACU Anesthesia Type: MAC Level of consciousness: awake and alert Pain management: pain level controlled Vital Signs Assessment: post-procedure vital signs reviewed and stable Respiratory status: spontaneous breathing, nonlabored ventilation, respiratory function stable and patient connected to nasal cannula oxygen Cardiovascular status: stable and blood pressure returned to baseline Postop Assessment: no apparent nausea or vomiting Anesthetic complications: no    Last Vitals:  Vitals:   08/23/2019 1300 07/30/2019 1305  BP: 97/69 97/73  Pulse: (!) 113 (!) 114  Resp: 18 19  Temp: 36.4 C   SpO2: 100% 100%    Last Pain:  Vitals:   08/01/2019 1250  TempSrc:   PainSc: 0-No pain                 Tiajuana Amass

## 2019-08-16 NOTE — Progress Notes (Signed)
Report given to pre-op RN. All questions answered. Pre-procedure checklist completed.

## 2019-08-16 NOTE — Consult Note (Signed)
Reason for Consult: Malignant left pleural effusion Referring Physician: [facility]  [name] is an 64 y.o. male.  HPI: [name] is a 64 year old man with history of hyperlipidemia, hypertension, stage IV lung cancer, recurrent left pleural effusion who presented with worsening shortness of breath.  He has had multiple thoracenteses.  With each thoracentesis he has some initial relief but his symptoms rapidly recur.  Chest x-ray and CT showed a large left pleural effusion with midline shift to the right.  He now is transferred for palliative management of his malignant effusion.  Past Medical History:  Diagnosis Date  . ED (erectile dysfunction)   . Hyperlipidemia   . Hypertension   . Lung cancer [facility])     Past Surgical History:  Procedure Laterality Date  . APPENDECTOMY    . COLONOSCOPY      No family history on file.  Social History:  reports that he has been smoking. He has never used smokeless tobacco. No history on file for alcohol and drug.  Allergies: No Known Allergies  Medications:  Scheduled: . ascorbic acid  500 mg Oral q morning - 10a  . aspirin EC  81 mg Oral q morning - 10a  . denosumab  60 mg Subcutaneous Once  . enoxaparin (LOVENOX) injection  40 mg Subcutaneous Q24H  . latanoprost  1 drop Both Eyes QHS  . lisinopril  40 mg Oral QPM  . multivitamin with minerals  1 tablet Oral q morning - 10a  . pravastatin  20 mg Oral QPM  . zoledronic acid (ZOMETA) IVPB Oncology  4 mg Intravenous Once    Results for orders placed or performed during the hospital encounter of 08/11/2019 (from the past 48 hour(s))  Brain natriuretic peptide     Status: Abnormal   Collection Time: 08/11/2019  9:46 AM  Result Value Ref Range   B Natriuretic Peptide 178.0 (H) 0.0 - 100.0 pg/mL    Comment: Performed at [facility], [address] [place], [place] [address]  Basic metabolic panel     Status: Abnormal   Collection Time: 08/03/2019  9:46 AM  Result Value  Ref Range   Sodium 139 135 - 145 mmol/L   Potassium 4.2 3.5 - 5.1 mmol/L   Chloride 90 (L) 98 - 111 mmol/L   CO2 32 22 - 32 mmol/L   Glucose, Bld 141 (H) 70 - 99 mg/dL   BUN 43 (H) 8 - 23 mg/dL   Creatinine, Ser 1.23 0.61 - 1.24 mg/dL   Calcium 13.9 (HH) 8.9 - 10.3 mg/dL    Comment: CRITICAL RESULT CALLED TO, READ BACK BY AND VERIFIED WITH: OWENS,S AT 10:15AM ON 07/31/2019 BY FESTERMAN,C    GFR calc non Af Amer >60 >60 mL/min   GFR calc Af Amer >60 >60 mL/min   Anion gap 17 (H) 5 - 15    Comment: Performed at [facility], [address] [place], [place] [address]  CBC with Differential     Status: Abnormal   Collection Time: 08/23/2019  9:46 AM  Result Value Ref Range   WBC 15.5 (H) 4.0 - 10.5 K/uL   RBC 5.06 4.22 - 5.81 MIL/uL   Hemoglobin 15.3 13.0 - 17.0 g/dL   HCT 46.9 39.0 - 52.0 %   MCV 92.7 80.0 - 100.0 fL   MCH 30.2 26.0 - 34.0 pg   MCHC 32.6 30.0 - 36.0 g/dL   RDW 14.2 11.5 - 15.5 %   Platelets 279 150 - 400 K/uL  nRBC 0.0 0.0 - 0.2 %   Neutrophils Relative % 89 %   Neutro Abs 13.8 (H) 1.7 - 7.7 K/uL   Lymphocytes Relative 4 %   Lymphs Abs 0.6 (L) 0.7 - 4.0 K/uL   Monocytes Relative 7 %   Monocytes Absolute 1.1 (H) 0.1 - 1.0 K/uL   Eosinophils Relative 0 %   Eosinophils Absolute 0.0 0.0 - 0.5 K/uL   Basophils Relative 0 %   Basophils Absolute 0.0 0.0 - 0.1 K/uL   Immature Granulocytes 0 %   Abs Immature Granulocytes 0.06 0.00 - 0.07 K/uL    Comment: Performed at [facility], [address], [place], [place] [address]  Magnesium     Status: None   Collection Time: 08/14/2019  9:46 AM  Result Value Ref Range   Magnesium 2.3 1.7 - 2.4 mg/dL    Comment: Performed at [facility], [address] [place], [place] [address]  Phosphorus     Status: None   Collection Time: 08/04/2019  9:46 AM  Result Value Ref Range   Phosphorus 3.3 2.5 - 4.6 mg/dL    Comment: Performed at [facility], [address], [place], [place] [address]  TSH     Status: None   Collection Time:  08/04/2019  9:46 AM  Result Value Ref Range   TSH 0.580 0.350 - 4.500 uIU/mL    Comment: Performed by a 3rd Generation assay with a functional sensitivity of <=0.01 uIU/mL. Performed at [facility], [address] [place], [place] [address]   Gram stain     Status: None   Collection Time: 08/17/2019 10:56 AM   Specimen: PATH Cytology Peritoneal fluid  Result Value Ref Range   Specimen Description PLEURAL    Gram Stain      NO ORGANISMS SEEN WBC PRESENT, PREDOMINANTLY MONONUCLEAR CYTOSPIN SMEAR Performed at [facility], [address] [place], [place] [address]    Report Status 08/01/2019 FINAL   Culture, body fluid-bottle     Status: None (Preliminary result)   Collection Time: 08/14/2019 10:58 AM   Specimen: Pleura  Result Value Ref Range   Specimen Description PLEURAL    Special Requests BOTTLES DRAWN AEROBIC AND ANAEROBIC 10CC    Culture      NO GROWTH < 24 HOURS Performed at [facility], [address], [place], [place] [address]    Report Status PENDING   Basic metabolic panel     Status: Abnormal   Collection Time: 07/30/2019  2:55 AM  Result Value Ref Range   Sodium 138 135 - 145 mmol/L   Potassium 4.6 3.5 - 5.1 mmol/L   Chloride 96 (L) 98 - 111 mmol/L   CO2 29 22 - 32 mmol/L   Glucose, Bld 105 (H) 70 - 99 mg/dL   BUN 41 (H) 8 - 23 mg/dL   Creatinine, Ser 1.09 0.61 - 1.24 mg/dL   Calcium 13.4 (HH) 8.9 - 10.3 mg/dL    Comment: CRITICAL RESULT CALLED TO, READ BACK BY AND VERIFIED WITH: Karolee Stamps, RN 07/30/2019 0358 WILDERK    GFR calc non Af Amer >60 >60 mL/min   GFR calc Af Amer >60 >60 mL/min   Anion gap 13 5 - 15    Comment: Performed at [facility], [place] [address], [place], [place] [address]  CBC     Status: Abnormal   Collection Time: 08/09/2019  2:55 AM  Result Value Ref Range   WBC 14.5 (H) 4.0 - 10.5 K/uL  RBC 4.67 4.22 - 5.81 MIL/uL   Hemoglobin 14.2 13.0 - 17.0 g/dL   HCT 42.5 39.0 - 52.0 %   MCV 91.0 80.0 - 100.0 fL   MCH 30.4 26.0 - 34.0 pg   MCHC  33.4 30.0 - 36.0 g/dL   RDW 14.1 11.5 - 15.5 %   Platelets 236 150 - 400 K/uL   nRBC 0.0 0.0 - 0.2 %    Comment: Performed at [facility], [place] [address] [place], [place] [address]  Surgical pcr screen     Status: None   Collection Time: 08/06/2019  5:37 AM   Specimen: Nasal Mucosa; Nasal Swab  Result Value Ref Range   MRSA, PCR NEGATIVE NEGATIVE   Staphylococcus aureus NEGATIVE NEGATIVE    Comment: (NOTE) The Xpert SA Assay (FDA approved for NASAL specimens in patients 54 years of age and older), is one component of a comprehensive surveillance program. It is not intended to diagnose infection nor to guide or monitor treatment. Performed at [facility], [place] [address] [place], [place] [address]     DG Chest Port 1 View  Result Date: 08/14/2019 CLINICAL DATA:  Shortness of breath.  Lung carcinoma EXAM: PORTABLE CHEST 1 VIEW COMPARISON:  August 08, 2019 FINDINGS: There remains essentially complete opacification of the left hemithorax with shift of heart and mediastinum toward the right. Right lung remains clear. Heart size is grossly normal. No adenopathy evident on the right. Note that the left hilar and mediastinal regions are obscured by the opacification. No bone lesions. IMPRESSION: Complete opacification of the left hemithorax with shift of heart and mediastinum toward the right. Suspect large pleural effusion on the left. There may well be underlying mass and/or consolidation on the left. Right lung clear. Grossly stable cardiac silhouette. Electronically Signed   By: Lowella Grip III M.D.   On: 08/24/2019 09:53   DG Chest Port 1V same Day  Result Date: 08/11/2019 CLINICAL DATA:  Post left-sided thoracentesis. EXAM: PORTABLE CHEST 1 VIEW COMPARISON:  Earlier same day; 08/08/2019; 08/07/2019; chest CT-07/12/2019 FINDINGS: Persistent complete opacification of the left hemithorax following ultrasound-guided thoracentesis. There is persistent deviation of the  mediastinal structures to the right. Pulmonary venous congestion within the aerated right lung without new focal airspace opacity. No pneumothorax. No acute osseous abnormalities. IMPRESSION: Persistent complete opacification of the left hemithorax following thoracentesis. No pneumothorax. Electronically Signed   By: Sandi Mariscal M.D.   On: 08/18/2019 11:29   US THORACENTESIS ASP PLEURAL SPACE W/IMG GUIDE  Result Date: 08/10/2019 INDICATION: Lung Ca Recurrent pleural effusion SOB EXAM: ULTRASOUND GUIDED Left THORACENTESIS MEDICATIONS: 10 cc 1% lidocaine. COMPLICATIONS: None immediate. PROCEDURE: An ultrasound guided thoracentesis was thoroughly discussed with the patient and questions answered. The benefits, risks, alternatives and complications were also discussed. The patient understands and wishes to proceed with the procedure. Written consent was obtained. Ultrasound was performed to localize and mark an adequate pocket of fluid in the left chest. The area was then prepped and draped in the normal sterile fashion. 1% Lidocaine was used for local anesthesia. Under ultrasound guidance a 19 g Yueh catheter was introduced. Thoracentesis was performed. The catheter was removed and a dressing applied. FINDINGS: A total of approximately 1.5 liters of bloody fluid was removed. Samples were sent to the laboratory as requested by the clinical team. IMPRESSION: Successful ultrasound guided left thoracentesis yielding 1.5 liters of pleural fluid. Read by Lavonia Drafts [facility] Electronically Signed   By: Sandi Mariscal  M.D.   On: 08/08/2019 11:17    Review of Systems  Constitutional: Positive for activity change, fatigue and unexpected weight change (30 pound weight loss).  Respiratory: Positive for cough and shortness of breath.   Cardiovascular: Negative for chest pain and leg swelling.  Gastrointestinal: Negative for abdominal pain.   Blood pressure 103/71, pulse (!) 108, temperature 98.3 F (36.8 C), temperature  source Oral, resp. rate (!) 21, height 6\' 1"  (1.854 m), weight 95.3 kg, SpO2 98 %. Physical Exam  Constitutional: He is oriented to person, place, and time. He appears well-developed and well-nourished. No distress.  Eyes: EOM are normal. No scleral icterus.  Neck: Tracheal deviation (To right) present.  Cardiovascular: Normal heart sounds.  No murmur heard. Mildly tachycardic, regular  Respiratory: No respiratory distress. He has no wheezes. He has no rales.  Using accessory muscles of respiration.  No breath sounds on left  GI: Soft. He exhibits no distension. There is no abdominal tenderness.  Musculoskeletal:        General: No edema.  Neurological: He is alert and oriented to person, place, and time. No cranial nerve deficit. He exhibits normal muscle tone.  Skin: Skin is warm.    Assessment/Plan: [name] is a 64 year old man with a history of tobacco abuse who presented with hoarseness, a 30 pound weight loss, and shortness of breath.  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  He has had multiple thoracenteses with temporary relief but a rapid recurrence of his symptoms.  He now presents with a large left pleural effusion with midline shift to the right.  Pleural catheter placement is indicated for palliative management of his malignant pleural effusion.  I offered the patient the option of a left pleural catheter placement for management of his pleural effusion.  He understands that this procedure would be done in the operating room with sedation and local anesthesia.  He understands this is palliative and not curative.  He understands he will have an indwelling catheter that will need ongoing care.  I informed him of the indications, risk, benefits, and alternatives.  He understands the risks include, but are not limited to bleeding, infection, air leak, injury to underlying organs, catheter malposition, and catheter  occlusion.  He understands and accepts the risks and wishes to proceed.  Pleural catheter later this morning [name] 07/31/2019, 7:39 AM

## 2019-08-16 NOTE — Anesthesia Preprocedure Evaluation (Signed)
Anesthesia Evaluation  Patient identified by MRN, date of birth, ID band Patient awake    Reviewed: Allergy & Precautions, NPO status , Patient's Chart, lab work & pertinent test results  Airway Mallampati: II  TM Distance: >3 FB     Dental  (+) Dental Advisory Given   Pulmonary Current Smoker,  Malignant left pleural effusion    breath sounds clear to auscultation       Cardiovascular hypertension,  Rhythm:Regular Rate:Normal     Neuro/Psych negative neurological ROS     GI/Hepatic negative GI ROS, Neg liver ROS,   Endo/Other  negative endocrine ROS  Renal/GU negative Renal ROS     Musculoskeletal   Abdominal   Peds  Hematology negative hematology ROS (+)   Anesthesia Other Findings   Reproductive/Obstetrics                             Anesthesia Physical Anesthesia Plan  ASA: III  Anesthesia Plan: MAC   Post-op Pain Management:    Induction: Intravenous  PONV Risk Score and Plan: 0 and Propofol infusion, Ondansetron and Treatment may vary due to age or medical condition  Airway Management Planned: Natural Airway and Simple Face Mask  Additional Equipment:   Intra-op Plan:   Post-operative Plan:   Informed Consent: I have reviewed the patients History and Physical, chart, labs and discussed the procedure including the risks, benefits and alternatives for the proposed anesthesia with the patient or authorized representative who has indicated his/her understanding and acceptance.     Dental advisory given  Plan Discussed with: CRNA  Anesthesia Plan Comments:         Anesthesia Quick Evaluation

## 2019-08-16 NOTE — Progress Notes (Addendum)
Called by PACU, pt hypotensive after 2L drained, Pleurx catheter placed, blood pressure in the 70s, subsequently given 3 doses of IV albumin and started on a phenylephrine drip, currently at 72mcg/hour -likely secondary to fluid shifts, anesthesia -Will likely need ICU overnight and pressor support for few hours today -Stat CBC ordered and pending -discuss with CCM  Domenic Polite, MD

## 2019-08-16 NOTE — Consult Note (Signed)
NAME:  [name], MRN:  [id number], DOB:  05/28/1956, LOS: 1 ADMISSION DATE:  08/23/2019, CONSULTATION DATE:  2/19 REFERRING MD:  Broadus John, CHIEF COMPLAINT:  Post-op hypotension    Brief History   64 year old male patient admitted on 2/18 with recurrent left pleural effusion in the context of stage IV non-small cell lung cancer.  He was transferred to [facility] for Pleurx tube placement, 2 L of fluid was removed intraoperatively.  During the case noted to have hypotension as low with systolic blood pressure in the 70s, was given 3 doses of albumin and remained hypotensive and therefore was placed on Neo-Synephrine drip, critical care consulted for postoperative hypotension.  History of present illness   64 year old black male admitted on 2/18 with chief complaint of shortness of breath and worsening exertional dyspnea.  Recently diagnosed with stage IV lung cancer with recurrent left pleural effusion.  He has undergone multiple thoracentesis with rapid reaccumulation of fluid.  Presented to the emergency room at [facility] on 2/18 with acute shortness of breath and exertional dyspnea.  Chest x-ray showed complete opacification of the left thorax CT chest showing large left pleural effusion with midline shift.  He underwent a large-volume thoracentesis in the emergency room where they removed 1.5 L, reported as bloody pleural fluid.  He was transferred to [facility] for thoracic surgery evaluation and placement of Pleurx catheter.  He underwent successful Pleurx placement on 2/19 by thoracic surgery , They removed 2 L of pleural fluid, intraoperatively was noted to have hypotension with systolic blood pressure as low as the 70s.  He was administered 3 doses of albumin by anesthesia, and then started on low-dose phenylephrine at 30 mics per minute.  In the recovery room he was awake, drowsy, but oriented reporting left-sided surgical site discomfort but otherwise stable current systolic blood  pressure in the 90s on 30 mics per minute of Neo-Synephrine.  Critical care was consulted for postoperative hypotension  Past Medical History  History of tobacco abuse, hyperlipidemia, recently diagnosed with metastatic non-small cell lung cancer with recurrent left malignant pleural effusion  Significant Hospital Events   2/18 evaluated in the emergency room at [facility] where he underwent a 1.5 L thoracentesis.  Fluid reported as blood-tinged, transported to [facility] for thoracic surgery evaluation 2/19: Underwent Pleurx catheter placement by thoracic surgery.  2 L of pleural fluid removed.  Intra-Op hypotension noted, administered 3 doses of albumin but remained hypotensive so started on Neo-Synephrine infusion, critical care asked to evaluate  Consults:  Thoracic surgery 2/18 Critical care 2/19 Procedures:  Pleurx catheter placement 2/19  Significant Diagnostic Tests:    Micro Data:  2/18 pleural fluid>>> 2/18 Gram stain pleural fluid>>>  Antimicrobials:  Ancef x1 Intra-Op Interim history/subjective:  Awake, not in distress,  Objective   Blood pressure (Abnormal) 78/58, pulse (Abnormal) 117, temperature 98.3 F (36.8 C), temperature source Oral, resp. rate 16, height 6\' 1"  (1.854 m), weight 95.3 kg, SpO2 98 %.        Intake/Output Summary (Last 24 hours) at 08/08/2019 1156 Last data filed at 07/31/2019 0935 Gross per 24 hour  Intake 1311.06 ml  Output 2000 ml  Net -688.94 ml   Filed Weights   08/02/2019 0907  Weight: 95.3 kg    Examination: General: This is a 64 year old black male he is currently resting in bed postoperatively.  He is in no acute distress currently, currently on 2 L via nasal cannula  HENT: Normocephalic atraumatic mucous  membranes slightly dry no neck vein distention appreciated sclera nonicteric Lungs: Clear, diminished on the left.  No accessory use currently.  Left Pleurx catheter dressing clean dry and intact Cardiovascular: Mildly  tachycardic no murmur rub or gallop Abdomen: Soft nontender no organomegaly Extremities: Warm and dry, brisk capillary refill Neuro: Awake, oriented no focal deficits GU: Due to void  Resolved Hospital Problem list     Assessment & Plan:  Postoperative hypotension. Favor hypovolemia, possibly secondary to volume shift from large volume fluid removal, probably further complicated by anesthetics. Plan Rebolus crystalloid Admit to intensive care Peripheral Neo-Synephrine titrating for systolic blood pressure greater than 100 Follow-up CBC Hold lisinopril Chest x-ray now, rule out pneumothorax  Stage IV non-small cell lung cancer with recurrent left pleural effusion -Now status post Pleurx drain placement 2/19 Plan Chest x-ray now Supplemental oxygen, pulse oximetry, titrate for saturations greater than 90% As needed bronchodilator Follow-up pleural cytology Pleural drainage regimen per thoracic surgery  Mild leukocytosis Plan Trend CBC  Mild azotemia.  BUN doubled from baseline, creatinine currently stable Plan Avoid hypotension Continuing IV fluids post fluid bolus A.m. chemistry  Best practice:  Diet: Advance as tolerated Pain/Anxiety/Delirium protocol (if indicated): Not indicated VAP protocol (if indicated): Not indicated DVT prophylaxis: Enoxaparin GI prophylaxis: None indicated Glucose control: Not indicated Mobility: Advance as tolerated Code Status: Full code Family Communication: Pending Disposition:  Admit to intensive care for postoperative hypotension management Labs   CBC: Recent Labs  Lab 08/04/2019 0946 08/18/2019 0255  WBC 15.5* 14.5*  NEUTROABS 13.8*  --   HGB 15.3 14.2  HCT 46.9 42.5  MCV 92.7 91.0  PLT 279 580    Basic Metabolic Panel: Recent Labs  Lab 08/02/2019 0946 07/31/2019 0255  NA 139 138  K 4.2 4.6  CL 90* 96*  CO2 32 29  GLUCOSE 141* 105*  BUN 43* 41*  CREATININE 1.23 1.09  CALCIUM 13.9* 13.4*  MG 2.3  --   PHOS 3.3  --      GFR: Estimated Creatinine Clearance: 80.4 mL/min (by C-G formula based on SCr of 1.09 mg/dL). Recent Labs  Lab 08/21/2019 0946 07/31/2019 0255  WBC 15.5* 14.5*    Liver Function Tests: No results for input(s): AST, ALT, ALKPHOS, BILITOT, PROT, ALBUMIN in the last 168 hours. No results for input(s): LIPASE, AMYLASE in the last 168 hours. No results for input(s): AMMONIA in the last 168 hours.  ABG No results found for: PHART, PCO2ART, PO2ART, HCO3, TCO2, ACIDBASEDEF, O2SAT   Coagulation Profile: No results for input(s): INR, PROTIME in the last 168 hours.  Cardiac Enzymes: No results for input(s): CKTOTAL, CKMB, CKMBINDEX, TROPONINI in the last 168 hours.  HbA1C: No results found for: HGBA1C  CBG: No results for input(s): GLUCAP in the last 168 hours.  Review of Systems:   Not able currently given residual anesthesia effect  Past Medical History  He,  has a past medical history of ED (erectile dysfunction), Hyperlipidemia, Hypertension, and Lung cancer ([place]).   Surgical History    Past Surgical History:  Procedure Laterality Date  . APPENDECTOMY    . COLONOSCOPY       Social History   reports that he has been smoking. He has never used smokeless tobacco.   Family History   His family history is not on file.   Allergies No Known Allergies   Home Medications  Prior to Admission medications   Medication Sig Start Date End Date Taking? Authorizing Provider  ascorbic acid (VITAMIN C)  500 MG tablet Take 500 mg by mouth every morning.   Yes [provider]  aspirin EC 81 MG tablet Take 81 mg by mouth every morning.    Yes [provider]  hydrochlorothiazide (HYDRODIURIL) 25 MG tablet One tablet daily in morning Patient taking differently: Take 25 mg by mouth every morning.  03/11/19  Yes Mikey Kirschner, MD  HYDROcodone-acetaminophen (NORCO/VICODIN) 5-325 MG tablet Take 1 tablet by mouth every 8 (eight) hours as needed for moderate pain. 08/05/19   Yes Derek Jack, MD  lisinopril (ZESTRIL) 40 MG tablet Take 1 tablet (40 mg total) by mouth daily. Patient taking differently: Take 40 mg by mouth every evening.  03/11/19  Yes Mikey Kirschner, MD  Multiple Vitamin (MULTIVITAMIN) tablet Take 1 tablet by mouth every morning.    Yes [provider]  pravastatin (PRAVACHOL) 20 MG tablet TAKE 1 TABLET (20 MG TOTAL) BY MOUTH DAILY. Patient taking differently: Take 20 mg by mouth every evening.  03/11/19  Yes Mikey Kirschner, MD  TRAVATAN Z 0.004 % SOLN ophthalmic solution Place 1 drop into both eyes daily.  10/05/15  Yes [provider]     Critical care time: 32 minutes     Erick Colace ACNP-BC [place] Pager # [phone] OR # [phone] if no answer

## 2019-08-16 NOTE — Progress Notes (Addendum)
PROGRESS NOTE    [name]  JQG:[id number] DOB: 1955-07-22 DOA: 07/30/2019 PCP: Mikey Kirschner, MD  Brief Narrative: [name] is a 64 y.o. 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 -Underwent thoracentesis approximately a month ago -In the emergency room underwent thoracentesis with 1.5 L of bloody fluid drained from his left pleural space -Despite this continues to have dyspnea, CT and x-ray showed large left pleural effusion with midline shift -He was transferred to [facility] for CVTS evaluation  Assessment & Plan:   Malignant recurrent left pleural effusion -History of thoracentesis in the past month, and thoracentesis done yesterday with 1.5 L of bloody fluid drained -Appreciate CVTS consult -Plan for Pleurx catheter placement today -Has considerable underlying atelectatic lung and tumor as well -Add incentive spirometry  Metastatic non-small cell lung cancer -New diagnosis -Bone/rib biopsy recently positive for metastasis, non small cell -Being followed by [place], has not started treatment yet  Hypercalcemia of malignancy -Discontinue HCTZ -Continue IV fluids today, Zometa x1 and calcitonin -Follow c-Met daily  Hypertension -Continue lisinopril  History of COVID-19 -22 days out from positive test -Does not need isolation at this time  DVT prophylaxis: SCDs Code Status: Full code Family Communication: No family at bedside Disposition Plan: Home tomorrow if stable  Consultants:   CVTS   Procedures:   Antimicrobials:    Subjective: -Does report some dyspnea, chronic cough, denies fevers or chills  Objective: Vitals:   08/03/2019 1920 08/07/2019 2111 08/08/2019 2336 08/05/2019 0500  BP: 114/82    103/71  Pulse:    (!) 108  Resp: (!) _0 (!) 21  Temp: 97.9 F (36.6 C)   98.3 F (36.8 C)  TempSrc: Oral   Oral  SpO2: 100% 99% 97% 98%  Weight:      Height:        Intake/Output Summary (Last 24 hours) at 08/23/2019 1033 Last data filed at 08/20/2019 0935 Gross per 24 hour  Intake 1311.06 ml  Output 2000 ml  Net -688.94 ml   Filed Weights   08/12/2019 0907  Weight: 95.3 kg    Examination:  General exam: Appears calm and comfortable, AAO x3, no distress Respiratory system: Decreased breath sounds in theleft Cardiovascular system: S1 & S2 heard, RRR  Gastrointestinal system: Abdomen is nondistended, soft and nontender.Normal bowel sounds heard. Central nervous system: Alert and oriented. No focal neurological deficits. Extremities: no edema Skin: No rashes, lesions or ulcers Psychiatry: Judgement and insight appear normal. Mood & affect appropriate.     Data Reviewed:   CBC: Recent Labs  Lab 08/25/2019 0946 08/17/2019 0255  WBC 15.5* 14.5*  NEUTROABS 13.8*  --   HGB 15.3 14.2  HCT 46.9 42.5  MCV 92.7 91.0  PLT 279 197   Basic Metabolic Panel: Recent Labs  Lab 08/08/2019 0946 07/29/2019 0255  NA 139 138  K 4.2 4.6  CL 90* 96*  CO2 32 29  GLUCOSE 141* 105*  BUN 43* 41*  CREATININE 1.23 1.09  CALCIUM 13.9* 13.4*  MG 2.3  --   PHOS 3.3  --    GFR: Estimated Creatinine Clearance: 80.4 mL/min (by C-G formula based on SCr of 1.09 mg/dL). Liver Function Tests: No results for input(s): AST, ALT, ALKPHOS, BILITOT, PROT, ALBUMIN in the last 168 hours. No  results for input(s): LIPASE, AMYLASE in the last 168 hours. No results for input(s): AMMONIA in the last 168 hours. Coagulation Profile: No results for input(s): INR, PROTIME in the last 168 hours. Cardiac Enzymes: No results for input(s): CKTOTAL, CKMB, CKMBINDEX, TROPONINI in the last 168 hours. BNP (last 3 results) No results for input(s): PROBNP in the last 8760 hours. HbA1C: No results for input(s):  HGBA1C in the last 72 hours. CBG: No results for input(s): GLUCAP in the last 168 hours. Lipid Profile: No results for input(s): CHOL, HDL, LDLCALC, TRIG, CHOLHDL, LDLDIRECT in the last 72 hours. Thyroid Function Tests: Recent Labs    08/13/2019 0946  TSH 0.580   Anemia Panel: No results for input(s): VITAMINB12, FOLATE, FERRITIN, TIBC, IRON, RETICCTPCT in the last 72 hours. Urine analysis: No results found for: COLORURINE, APPEARANCEUR, [place], [place], [place], [place], [place], [place], [place], [place], NITRITE, LEUKOCYTESUR Sepsis Labs: _0 (procalcitonin:4,lacticidven:4)  ) Recent Results (from the past 240 hour(s))  Gram stain     Status: None   Collection Time: 08/03/2019 10:56 AM   Specimen: PATH Cytology Peritoneal fluid  Result Value Ref Range Status   Specimen Description PLEURAL  Final   Gram Stain   Final    NO ORGANISMS SEEN WBC PRESENT, PREDOMINANTLY MONONUCLEAR CYTOSPIN SMEAR Performed at [facility], [address] [place], [place] [address]    Report Status 08/10/2019 FINAL  Final  Culture, body fluid-bottle     Status: None (Preliminary result)   Collection Time: 08/20/2019 10:58 AM   Specimen: Pleura  Result Value Ref Range Status   Specimen Description PLEURAL  Final   Special Requests BOTTLES DRAWN AEROBIC AND ANAEROBIC 10CC  Final   Culture   Final    NO GROWTH < 24 HOURS Performed at [facility], [address] [place], [place] [address]    Report Status PENDING  Incomplete  Surgical pcr screen     Status: None   Collection Time: 08/11/2019  5:37 AM   Specimen: Nasal Mucosa; Nasal Swab  Result Value Ref Range Status   MRSA, PCR NEGATIVE NEGATIVE Final   Staphylococcus aureus NEGATIVE NEGATIVE Final    Comment: (NOTE) The Xpert SA Assay (FDA approved for NASAL specimens in patients 66 years of age and older), is one component of a comprehensive surveillance program. It is not intended to diagnose infection nor to guide or  monitor treatment. Performed at [facility], [place] [address], [place], [place] [address]          Radiology Studies: DG Chest Port 1 View  Result Date: 08/12/2019 CLINICAL DATA:  Shortness of breath.  Lung carcinoma EXAM: PORTABLE CHEST 1 VIEW COMPARISON:  August 08, 2019 FINDINGS: There remains essentially complete opacification of the left hemithorax with shift of heart and mediastinum toward the right. Right lung remains clear. Heart size is grossly normal. No adenopathy evident on the right. Note that the left hilar and mediastinal regions are obscured by the opacification. No bone lesions. IMPRESSION: Complete opacification of the left hemithorax with shift of heart and mediastinum toward the right. Suspect large pleural effusion on the left. There may well be underlying mass and/or consolidation on the left. Right lung clear. Grossly stable cardiac silhouette. Electronically Signed   By: Lowella Grip III M.D.   On: 08/09/2019 09:53   DG Chest Port 1V same Day  Result Date: 07/30/2019 CLINICAL DATA:  Post left-sided thoracentesis. EXAM: PORTABLE CHEST 1 VIEW COMPARISON:  Earlier same day; 08/08/2019; 08/07/2019; chest CT-07/12/2019 FINDINGS:  Persistent complete opacification of the left hemithorax following ultrasound-guided thoracentesis. There is persistent deviation of the mediastinal structures to the right. Pulmonary venous congestion within the aerated right lung without new focal airspace opacity. No pneumothorax. No acute osseous abnormalities. IMPRESSION: Persistent complete opacification of the left hemithorax following thoracentesis. No pneumothorax. Electronically Signed   By: Sandi Mariscal M.D.   On: 08/14/2019 11:29   DG Fluoro Guide CV Line-No Report  Result Date: 08/23/2019 Fluoroscopy was utilized by the requesting physician.  No radiographic interpretation.   US THORACENTESIS ASP PLEURAL SPACE W/IMG GUIDE  Result Date: 08/23/2019 INDICATION: Lung Ca  Recurrent pleural effusion SOB EXAM: ULTRASOUND GUIDED Left THORACENTESIS MEDICATIONS: 10 cc 1% lidocaine. COMPLICATIONS: None immediate. PROCEDURE: An ultrasound guided thoracentesis was thoroughly discussed with the patient and questions answered. The benefits, risks, alternatives and complications were also discussed. The patient understands and wishes to proceed with the procedure. Written consent was obtained. Ultrasound was performed to localize and mark an adequate pocket of fluid in the left chest. The area was then prepped and draped in the normal sterile fashion. 1% Lidocaine was used for local anesthesia. Under ultrasound guidance a 19 g Yueh catheter was introduced. Thoracentesis was performed. The catheter was removed and a dressing applied. FINDINGS: A total of approximately 1.5 liters of bloody fluid was removed. Samples were sent to the laboratory as requested by the clinical team. IMPRESSION: Successful ultrasound guided left thoracentesis yielding 1.5 liters of pleural fluid. Read by Lavonia Drafts [facility] Electronically Signed   By: Sandi Mariscal M.D.   On: 07/30/2019 11:17        Scheduled Meds: . [MAR Hold] ascorbic acid  500 mg Oral q morning - 10a  . [MAR Hold] aspirin EC  81 mg Oral q morning - 10a  . [MAR Hold] enoxaparin (LOVENOX) injection  40 mg Subcutaneous Q24H  . [MAR Hold] latanoprost  1 drop Both Eyes QHS  . [MAR Hold] lisinopril  40 mg Oral QPM  . [MAR Hold] multivitamin with minerals  1 tablet Oral q morning - 10a  . [MAR Hold] pravastatin  20 mg Oral QPM   Continuous Infusions: . sodium chloride 75 mL/hr at 08/07/2019 0600  . albumin human    . [MAR Hold] zoledronic acid (ZOMETA) IV       LOS: 1 day    Time spent: 33mn  PDomenic Polite MD [facility]  08/20/2019, 10:33 AM

## 2019-08-16 NOTE — Brief Op Note (Signed)
08/12/2019  9:51 AM  PATIENT:  [name]  64 y.o. male  PRE-OPERATIVE DIAGNOSIS:  MALIGNANT LEFT PLEURAL EFFUSION  POST-OPERATIVE DIAGNOSIS:  Malignant Left Pleural Effusion.  PROCEDURE:  Procedure(s): INSERTION PLEURAL DRAINAGE CATHETER (Left)  SURGEON:  Surgeon(s) and Role:    * Melrose Nakayama, MD - Primary  PHYSICIAN ASSISTANT:   ASSISTANTS: none   ANESTHESIA:   local and IV sedation  EBL:  minimal  BLOOD ADMINISTERED:none  DRAINS: pleural catheter left   LOCAL MEDICATIONS USED:  LIDOCAINE  and Amount: 10 ml  SPECIMEN:  Source of Specimen:  pleural fluid  DISPOSITION OF SPECIMEN:  PATHOLOGY  COUNTS:  YES  TOURNIQUET:  * No tourniquets in log *  DICTATION: .Other Dictation: Dictation Number -  PLAN OF CARE: Discharge to home after PACU  PATIENT DISPOSITION:  PACU - hemodynamically stable.   Delay start of Pharmacological VTE agent (>24hrs) due to surgical blood loss or risk of bleeding: no

## 2019-08-17 ENCOUNTER — Inpatient Hospital Stay (HOSPITAL_COMMUNITY): Payer: BC Managed Care – PPO

## 2019-08-17 DIAGNOSIS — C3412 Malignant neoplasm of upper lobe, left bronchus or lung: Secondary | ICD-10-CM | POA: Diagnosis not present

## 2019-08-17 DIAGNOSIS — N179 Acute kidney failure, unspecified: Secondary | ICD-10-CM | POA: Diagnosis not present

## 2019-08-17 DIAGNOSIS — R911 Solitary pulmonary nodule: Secondary | ICD-10-CM | POA: Diagnosis not present

## 2019-08-17 DIAGNOSIS — J90 Pleural effusion, not elsewhere classified: Secondary | ICD-10-CM | POA: Diagnosis not present

## 2019-08-17 DIAGNOSIS — J948 Other specified pleural conditions: Secondary | ICD-10-CM | POA: Diagnosis not present

## 2019-08-17 DIAGNOSIS — Z95828 Presence of other vascular implants and grafts: Secondary | ICD-10-CM | POA: Diagnosis not present

## 2019-08-17 DIAGNOSIS — R579 Shock, unspecified: Secondary | ICD-10-CM

## 2019-08-17 DIAGNOSIS — C3490 Malignant neoplasm of unspecified part of unspecified bronchus or lung: Secondary | ICD-10-CM | POA: Diagnosis not present

## 2019-08-17 LAB — COMPREHENSIVE METABOLIC PANEL
ALT: 44 U/L (ref 0–44)
AST: 45 U/L — ABNORMAL HIGH (ref 15–41)
Albumin: 3.2 g/dL — ABNORMAL LOW (ref 3.5–5.0)
Alkaline Phosphatase: 80 U/L (ref 38–126)
Anion gap: 14 (ref 5–15)
BUN: 53 mg/dL — ABNORMAL HIGH (ref 8–23)
CO2: 29 mmol/L (ref 22–32)
Calcium: 13.2 mg/dL (ref 8.9–10.3)
Chloride: 94 mmol/L — ABNORMAL LOW (ref 98–111)
Creatinine, Ser: 2.01 mg/dL — ABNORMAL HIGH (ref 0.61–1.24)
GFR calc Af Amer: 40 mL/min — ABNORMAL LOW (ref >60)
GFR calc non Af Amer: 35 mL/min — ABNORMAL LOW (ref >60)
Glucose, Bld: 93 mg/dL (ref 70–99)
Potassium: 4.6 mmol/L (ref 3.5–5.1)
Sodium: 137 mmol/L (ref 135–145)
Total Bilirubin: 1 mg/dL (ref 0.3–1.2)
Total Protein: 5.8 g/dL — ABNORMAL LOW (ref 6.5–8.1)

## 2019-08-17 LAB — CBC
HCT: 46.1 % (ref 39.0–52.0)
Hemoglobin: 14.8 g/dL (ref 13.0–17.0)
MCH: 30.8 pg (ref 26.0–34.0)
MCHC: 32.1 g/dL (ref 30.0–36.0)
MCV: 96 fL (ref 80.0–100.0)
Platelets: 185 10*3/uL (ref 150–400)
RBC: 4.8 MIL/uL (ref 4.22–5.81)
RDW: 14.2 % (ref 11.5–15.5)
WBC: 15.3 10*3/uL — ABNORMAL HIGH (ref 4.0–10.5)
nRBC: 0 % (ref 0.0–0.2)

## 2019-08-17 MED ORDER — SODIUM CHLORIDE 0.9 % IV BOLUS
1000.0000 mL | Freq: Once | INTRAVENOUS | Status: AC
  Administered 2019-08-17: 09:00:00 1000 mL via INTRAVENOUS

## 2019-08-17 MED ORDER — ORAL CARE MOUTH RINSE
15.0000 mL | Freq: Two times a day (BID) | OROMUCOSAL | Status: DC
  Administered 2019-08-18 – 2019-08-21 (×6): 15 mL via OROMUCOSAL

## 2019-08-17 MED ORDER — ZOLEDRONIC ACID 4 MG/5ML IV CONC
4.0000 mg | Freq: Once | INTRAVENOUS | Status: AC
  Administered 2019-08-17: 14:00:00 4 mg via INTRAVENOUS
  Filled 2019-08-17: qty 5

## 2019-08-17 MED ORDER — SODIUM CHLORIDE 0.9 % IV SOLN: INTRAVENOUS | Status: DC

## 2019-08-17 MED ORDER — CALCITONIN (SALMON) 200 UNIT/ML IJ SOLN
4.0000 [IU]/kg | Freq: Two times a day (BID) | INTRAMUSCULAR | Status: AC
  Administered 2019-08-17 – 2019-08-18 (×4): 386 [IU] via SUBCUTANEOUS
  Filled 2019-08-17 (×4): qty 1.93

## 2019-08-17 MED ORDER — HEPARIN SODIUM (PORCINE) 5000 UNIT/ML IJ SOLN
5000.0000 [IU] | Freq: Three times a day (TID) | INTRAMUSCULAR | Status: DC
  Administered 2019-08-17 – 2019-08-19 (×7): 5000 [IU] via SUBCUTANEOUS
  Filled 2019-08-17 (×7): qty 1

## 2019-08-17 NOTE — Consult Note (Signed)
NAME:  [name], MRN:  [id number], DOB:  1955/07/24, LOS: 2 ADMISSION DATE:  08/14/2019, CONSULTATION DATE:  2/19 REFERRING MD:  Broadus John, CHIEF COMPLAINT:  Post-op hypotension    Brief History   64 year old male patient admitted on 2/18 with recurrent left pleural effusion in the context of stage IV non-small cell lung cancer.  He was transferred to [facility] for Pleurx tube placement, 2 L of fluid was removed intraoperatively.  During the case noted to have hypotension as low with systolic blood pressure in the 70s, was given 3 doses of albumin and remained hypotensive and therefore was placed on Neo-Synephrine drip, critical care consulted for postoperative hypotension.  History of present illness   64 year old black male admitted on 2/18 with chief complaint of shortness of breath and worsening exertional dyspnea.  Recently diagnosed with stage IV lung cancer with recurrent left pleural effusion.  He has undergone multiple thoracentesis with rapid reaccumulation of fluid.  Presented to the emergency room at [facility] on 2/18 with acute shortness of breath and exertional dyspnea.  Chest x-ray showed complete opacification of the left thorax CT chest showing large left pleural effusion with midline shift.  He underwent a large-volume thoracentesis in the emergency room where they removed 1.5 L, reported as bloody pleural fluid.  He was transferred to [facility] for thoracic surgery evaluation and placement of Pleurx catheter.  He underwent successful Pleurx placement on 2/19 by thoracic surgery , They removed 2 L of pleural fluid, intraoperatively was noted to have hypotension with systolic blood pressure as low as the 70s.  He was administered 3 doses of albumin by anesthesia, and then started on low-dose phenylephrine at 30 mics per minute.  In the recovery room he was awake, drowsy, but oriented reporting left-sided surgical site discomfort but otherwise stable current systolic blood  pressure in the 90s on 30 mics per minute of Neo-Synephrine.  Critical care was consulted for postoperative hypotension  Past Medical History  History of tobacco abuse, hyperlipidemia, recently diagnosed with metastatic non-small cell lung cancer with recurrent left malignant pleural effusion  Significant Hospital Events   2/18 evaluated in the emergency room at [facility] where he underwent a 1.5 L thoracentesis.  Fluid reported as blood-tinged, transported to [facility] for thoracic surgery evaluation 2/19: Underwent Pleurx catheter placement by thoracic surgery.  2 L of pleural fluid removed.  Intra-Op hypotension noted, administered 3 doses of albumin but remained hypotensive so started on Neo-Synephrine infusion, critical care asked to evaluate  Consults:  Thoracic surgery 2/18 Critical care 2/19 Procedures:  Pleurx catheter placement 2/19  Significant Diagnostic Tests:    Micro Data:  2/18 pleural fluid>>>neg 2/18 Gram stain pleural fluid>>>neg  Antimicrobials:  Ancef x1 Intra-Op Interim history/subjective:  Awake, not in distress. Slept poorly. Remains on neosynephrine.  Objective   Blood pressure 97/62, pulse (!) 112, temperature 98 F (36.7 C), temperature source Oral, resp. rate (!) 24, height 6\' 1"  (1.854 m), weight 96.7 kg, SpO2 97 %.        Intake/Output Summary (Last 24 hours) at 08/17/2019 0745 Last data filed at 08/17/2019 0700 Gross per 24 hour  Intake 1853.86 ml  Output 2250 ml  Net -396.14 ml   Filed Weights   08/10/2019 0907 08/17/19 0315  Weight: 95.3 kg 96.7 kg    Examination: GEN: 64 year old man in NAD HEENT: MM dry, trachea midline CV: RRR, ext warm PULM: absent on L, pleurX site CDI GI: Soft,  hypoactive BS EXT: No edema NEURO: moves all 4 ext to command PSYCH: sleepy, AOx3 SKIN: No rashes   Resolved Hospital Problem list     Assessment & Plan:  Postoperative hypotension. Still think hypovolemic from pleurX drainage, lasix,  and poor PO   Plan Bolus another liter NS, start 100cc/hr Encourage PO  Stage IV non-small cell lung cancer with recurrent left pleural effusion -Now status post Pleurx drain placement 2/19; suspect entrapment physiology Plan Encourage IS Pleural drainage regimen per thoracic surgery  AKI, hypercalcemia, oliguria Plan Fluid bolus, encourage PO Renal US Calcitonin and zometa as ordered Would avoid lasix for now  Best practice:  Diet: Advance as tolerated Pain/Anxiety/Delirium protocol (if indicated): Not indicated VAP protocol (if indicated): Not indicated DVT prophylaxis: Enoxaparin GI prophylaxis: None indicated Glucose control: Not indicated Mobility: Advance as tolerated Code Status: Full code Family Communication: Pending Disposition:  ICU pending pressor liberation   The patient is critically ill with multiple organ systems failure and requires high complexity decision making for assessment and support, frequent evaluation and titration of therapies, application of advanced monitoring technologies and extensive interpretation of multiple databases. Critical Care Time devoted to patient care services described in this note independent of APP/resident time (if applicable)  is 31 minutes.   Erskine Emery MD [facility] 08/17/2019 7:52 AM Personal pager: [phone] If unanswered, please page CCM On-call: [phone]

## 2019-08-17 NOTE — Progress Notes (Signed)
Patient seen and examined on 61M -64 year old male with new diagnosis of metastatic non-small cell lung cancer, sent to [facility] ED from [facility] yesterday for recurrent left malignant pleural effusions. -Underwent thoracentesis 2/18 with 1.5 L drained -Pleurx catheter placed by CVTS yesterday 2/19 with another 2 L drained -Postop with hypotension and admitted to ICU -Remains hypotensive on Neo-Synephrine drip at 50 mcg/hr and getting IV fluids, boluses -Hypotension felt to be secondary to fluid loss, volume shifts and anesthesia -Today kidney function is worse likely secondary to persistent hypotension, hypoperfusion -Will remain in the ICU today, CCM following  Domenic Polite MD

## 2019-08-17 NOTE — Op Note (Signed)
NAME: DELVONTE, BERENSON MEDICAL RECORD ZO:[id number] ACCOUNT [id number] DATE OF BIRTH:[birth year] FACILITY: [facility] LOCATION: [facility] PHYSICIAN:Maclaine Ahola Chaya Jan, MD  OPERATIVE REPORT  DATE OF PROCEDURE:  08/01/2019  PREOPERATIVE DIAGNOSIS:  Malignant left pleural effusion.  POSTOPERATIVE DIAGNOSIS:  Malignant left pleural effusion.  PROCEDURE:  Insertion of left pleural catheter.  SURGEON:  Modesto Charon, MD  ASSISTANT:  None.  ANESTHESIA:  Local with intravenous sedation.  FINDINGS:  Two liters of amber fluid evacuated.  Catheter noted to be in good position in the pleural space.  CLINICAL NOTE:  The patient is a 64 year old gentleman recently diagnosed with stage IV lung cancer with a malignant left pleural effusion.  He has had rapid recurrence after previous thoracentesis.  He was advised to have a pleural catheter placement  for palliative management of the effusion.  The indications, risks, benefits, and alternatives were discussed in detail with the patient.  He understood and accepted the risks and agreed to proceed.  DESCRIPTION OF PROCEDURE:  Mr. Credit was brought to the operating room on 08/05/2019.  He was placed in a supine position with the left chest slightly elevated.  He was given intravenous sedation and monitored by the Anesthesia service.  The chest and  upper abdomen were prepped and draped in the usual sterile fashion.  Intravenous antibiotics were administered.  Sequential compression devices were in place for DVT prophylaxis.  A Bair Hugger was placed to maintain body temperature.  A timeout was performed.  The operative site then was infiltrated with 10 mL of 1% lidocaine local anesthesia.  An incision was made in the lateral chest, and the pleural effusion was accessed using modified Seldinger technique.  A wire was advanced into  the pleural space.  Fluoroscopy confirmed position of the wire.  An incision then was made anteriorly near the costal  margin, and the catheter was tunneled from the exit site to the entry site.  The cuff was left just under the skin at the exit site.   The tract over the wire was dilated.  The Peel-Away sheath introducer was placed.  The catheter was advanced and the Peel-Away sheath introducer was removed.  The catheter was placed to suction, and 2 L of amber pleural fluid were evacuated.  The lateral  incision was closed with a 3-0 Vicryl subcuticular suture and Dermabond was applied.  The catheter was secured at the exit site with a 3-0 nylon suture.  After draining 2 L of fluid, a second image was taken with fluoroscopy which showed the catheter in  good position of the pleural space with some residual pleural effusion still present.  The patient then was transported from the operating room to the [facility] in good condition.  LN/NUANCE  D:08/25/2019 T:08/17/2019 JOB:[id number]

## 2019-08-17 NOTE — Progress Notes (Addendum)
[address]       [place] [address]             [phone]      1 Day Post-Op Procedure(s) (LRB): INSERTION PLEURAL DRAINAGE CATHETER (Left) Subjective: Having some chest pain and shortness of breath this morning that became worse overnight.   Objective: Vital signs in last 24 hours: Temp:  [97.6 F (36.4 C)-98.6 F (37 C)] 98 F (36.7 C) (02/20 0703) Pulse Rate:  [108-117] 112 (02/20 0930) Cardiac Rhythm: Sinus tachycardia (02/20 0800) Resp:  [13-43] 20 (02/20 0930) BP: (76-102)/(58-74) 89/66 (02/20 0930) SpO2:  [90 %-100 %] 97 % (02/20 0930) Weight:  [96.7 kg] 96.7 kg (02/20 0315)     Intake/Output from previous day: 02/19 0701 - 02/20 0700 In: 1853.9 [P.O.:275; I.V.:1568.9] Out: 2250 [Urine:250] Intake/Output this shift: Total I/O In: 426.7 [I.V.:179.9; IV Piggyback:246.9] Out: -   General appearance: alert, cooperative and no distress Heart: regular rate and rhythm, S1, S2 normal, no murmur, click, rub or gallop Lungs: minimum breath sounds on the left Abdomen: soft, non-tender; bowel sounds normal; no masses,  no organomegaly Extremities: extremities normal, atraumatic, no cyanosis or edema Wound: pleurx cath dressed with sterile dressing  Lab Results: Recent Labs    08/18/2019 1153 08/17/19 0616  WBC 14.1* 15.3*  HGB 13.4 14.8  HCT 41.8 46.1  PLT 214 185   BMET:  Recent Labs    08/25/2019 0255 08/17/19 0616  NA 138 137  K 4.6 4.6  CL 96* 94*  CO2 29 29  GLUCOSE 105* 93  BUN 41* 53*  CREATININE 1.09 2.01*  CALCIUM 13.4* 13.2*    PT/INR:  Recent Labs    07/29/2019 1153  LABPROT 16.0*  INR 1.3*   ABG No results found for: PHART, HCO3, TCO2, ACIDBASEDEF, O2SAT CBG (last 3)  Recent Labs    08/20/2019 1511  GLUCAP 101*    Assessment/Plan: S/P Procedure(s) (LRB): INSERTION PLEURAL DRAINAGE CATHETER (Left)      1. Pleurx catheter drained this morning with 750cc before the patient became symptomatic. Will place to  a pleura vac this morning. Intermittently clamping through out the day based on the patient's symptoms.   Chest tube hooked up to a pleura vac using connection kit. Patient drained another 300cc immediately. Discussed with nursing to clamp tube intermittently to avoid large fluid shift.       LOS: 2 days    Elgie Collard 08/17/2019

## 2019-08-17 NOTE — Progress Notes (Signed)
CRITICAL VALUE ALERT  Critical Value:  Calcium 13.2  Date & Time Notied:  08/17/19 at [place]  Provider Notified: Dr. Tamala Julian  Orders Received/Actions taken: None at this time.

## 2019-08-18 ENCOUNTER — Encounter (HOSPITAL_COMMUNITY): Payer: BC Managed Care – PPO

## 2019-08-18 ENCOUNTER — Inpatient Hospital Stay (HOSPITAL_COMMUNITY): Payer: BC Managed Care – PPO

## 2019-08-18 LAB — BASIC METABOLIC PANEL
Anion gap: 16 — ABNORMAL HIGH (ref 5–15)
BUN: 71 mg/dL — ABNORMAL HIGH (ref 8–23)
CO2: 21 mmol/L — ABNORMAL LOW (ref 22–32)
Calcium: 12.1 mg/dL — ABNORMAL HIGH (ref 8.9–10.3)
Chloride: 103 mmol/L (ref 98–111)
Creatinine, Ser: 2.63 mg/dL — ABNORMAL HIGH (ref 0.61–1.24)
GFR calc Af Amer: 29 mL/min — ABNORMAL LOW (ref >60)
GFR calc non Af Amer: 25 mL/min — ABNORMAL LOW (ref >60)
Glucose, Bld: 140 mg/dL — ABNORMAL HIGH (ref 70–99)
Potassium: 4.7 mmol/L (ref 3.5–5.1)
Sodium: 140 mmol/L (ref 135–145)

## 2019-08-18 LAB — MAGNESIUM: Magnesium: 2.2 mg/dL (ref 1.7–2.4)

## 2019-08-18 MED ORDER — MIDODRINE HCL 5 MG PO TABS
10.0000 mg | ORAL_TABLET | Freq: Three times a day (TID) | ORAL | Status: DC
  Administered 2019-08-18 – 2019-08-22 (×9): 10 mg via ORAL
  Filled 2019-08-18 (×9): qty 2

## 2019-08-18 MED ORDER — LACTATED RINGERS IV SOLN: INTRAVENOUS | Status: DC

## 2019-08-18 MED ORDER — SENNOSIDES-DOCUSATE SODIUM 8.6-50 MG PO TABS
1.0000 | ORAL_TABLET | Freq: Two times a day (BID) | ORAL | Status: DC
  Administered 2019-08-18 – 2019-08-19 (×3): 1 via ORAL
  Filled 2019-08-18 (×3): qty 1

## 2019-08-18 NOTE — Progress Notes (Addendum)
NAME:  [name], MRN:  [id number], DOB:  26-Jun-1956, LOS: 3 ADMISSION DATE:  08/17/2019, CONSULTATION DATE:  2/19 REFERRING MD:  Broadus John, CHIEF COMPLAINT:  Post-op hypotension    Brief History   64 year old male patient admitted on 2/18 with recurrent left pleural effusion in the context of stage IV non-small cell lung cancer.  He was transferred to [facility] for Pleurx tube placement, 2 L of fluid was removed intraoperatively.  During the case noted to have hypotension as low with systolic blood pressure in the 70s, was given 3 doses of albumin and remained hypotensive and therefore was placed on Neo-Synephrine drip, critical care consulted for postoperative hypotension.  History of present illness   64 year old black male admitted on 2/18 with chief complaint of shortness of breath and worsening exertional dyspnea.  Recently diagnosed with stage IV lung cancer with recurrent left pleural effusion.  He has undergone multiple thoracentesis with rapid reaccumulation of fluid.  Presented to the emergency room at [facility] on 2/18 with acute shortness of breath and exertional dyspnea.  Chest x-ray showed complete opacification of the left thorax CT chest showing large left pleural effusion with midline shift.  He underwent a large-volume thoracentesis in the emergency room where they removed 1.5 L, reported as bloody pleural fluid.  He was transferred to [facility] for thoracic surgery evaluation and placement of Pleurx catheter.  He underwent successful Pleurx placement on 2/19 by thoracic surgery , They removed 2 L of pleural fluid, intraoperatively was noted to have hypotension with systolic blood pressure as low as the 70s.  He was administered 3 doses of albumin by anesthesia, and then started on low-dose phenylephrine at 30 mics per minute.  In the recovery room he was awake, drowsy, but oriented reporting left-sided surgical site discomfort but otherwise stable current systolic blood  pressure in the 90s on 30 mics per minute of Neo-Synephrine.  Critical care was consulted for postoperative hypotension  Past Medical History  History of tobacco abuse, hyperlipidemia, recently diagnosed with metastatic non-small cell lung cancer with recurrent left malignant pleural effusion  Significant Hospital Events   2/18 evaluated in the emergency room at [facility] where he underwent a 1.5 L thoracentesis.  Fluid reported as blood-tinged, transported to [facility] for thoracic surgery evaluation 2/19: Underwent Pleurx catheter placement by thoracic surgery.  2 L of pleural fluid removed.  Intra-Op hypotension noted, administered 3 doses of albumin but remained hypotensive so started on Neo-Synephrine infusion, critical care asked to evaluate  Consults:  Thoracic surgery 2/18 Critical care 2/19 Procedures:  Pleurx catheter placement 2/19  Significant Diagnostic Tests:    Micro Data:  2/18 pleural fluid>>>neg 2/18 Gram stain pleural fluid>>>neg  Antimicrobials:  Ancef x1 Intra-Op Interim history/subjective:  Awake Poor PO Still SOB PleurX now hooked to atrium, draining large volumes Labs pending  Objective   Blood pressure 91/75, pulse (!) 110, temperature 97.8 F (36.6 C), temperature source Oral, resp. rate (!) 22, height 6\' 1"  (1.854 m), weight 100.3 kg, SpO2 97 %.        Intake/Output Summary (Last 24 hours) at 08/18/2019 0833 Last data filed at 08/18/2019 0700 Gross per 24 hour  Intake 6374.59 ml  Output 2676 ml  Net 3698.59 ml   Filed Weights   08/09/2019 0907 08/17/19 0315 08/18/19 0500  Weight: 95.3 kg 96.7 kg 100.3 kg    Examination: GEN: 64 year old man in NAD HEENT: MM dry, trachea midline CV: RRR, ext  warm PULM: absent on L, pleurX site CDI, serous fluid in atrium GI: Soft, hypoactive BS EXT: No edema NEURO: moves all 4 ext to command PSYCH: sleepy, AOx3 SKIN: No rashes   Resolved Hospital Problem list     Assessment & Plan:    Postoperative hypotension. Outs are 2.7L + 3.5 L (uncharted pleurX output) = 6.2L, ins 6.4L  Plan -F/u AM labs -Start midodrine, protein supplements -Every IVF we are adding is coming out chest tube, I think best course here may be to engage medonc and radonc for inpatient chemoradiation to see if we can open up left mainstem to stop vacuum physiology in left pleural space, will d/w primary  Stage IV non-small cell lung cancer with recurrent left pleural effusion -Now status post Pleurx drain placement 2/19; suspect entrapment physiology Plan Encourage IS Pleural drainage regimen per thoracic surgery  AKI, hypercalcemia, oliguria Plan Fluid bolus, encourage PO Renal US Calcitonin and zometa as ordered Would avoid lasix for now  LUE swelling- distal pulses intact, check venous duplex  Erskine Emery MD [facility] 08/18/2019 8:33 AM Personal pager: [phone] If unanswered, please page CCM On-call: [phone]

## 2019-08-18 NOTE — Progress Notes (Addendum)
      [address]       [place] [address]             [phone]      2 Days Post-Op Procedure(s) (LRB): INSERTION PLEURAL DRAINAGE CATHETER (Left) Subjective: Feels the same this morning regarding his shortness of breath.   Objective: Vital signs in last 24 hours: Temp:  [97.7 F (36.5 C)-98.2 F (36.8 C)] 97.8 F (36.6 C) (02/21 0338) Pulse Rate:  [104-118] 113 (02/21 0845) Cardiac Rhythm: Sinus tachycardia (02/21 0800) Resp:  [13-37] 29 (02/21 0845) BP: (68-135)/(45-90) 108/84 (02/21 0845) SpO2:  [83 %-100 %] 98 % (02/21 0845) Weight:  [100.3 kg] 100.3 kg (02/21 0500)     Intake/Output from previous day: 02/20 0701 - 02/21 0700 In: 6464.6 [P.O.:794; I.V.:4427.7; IV Piggyback:1242.9] Out: 2676 [Urine:375; Stool:1; Chest Tube:2300] Intake/Output this shift: Total I/O In: 337.5 [I.V.:337.5] Out: 50 [Chest Tube:50]  General appearance: alert, cooperative and no distress Heart: regular rate and rhythm, S1, S2 normal, no murmur, click, rub or gallop Lungs: clear to auscultation bilaterally Abdomen: soft, non-tender; bowel sounds normal; no masses,  no organomegaly Extremities: left upper extremity with edema today  Wound: clean and dry  Lab Results: Recent Labs    07/31/2019 1153 08/17/19 0616  WBC 14.1* 15.3*  HGB 13.4 14.8  HCT 41.8 46.1  PLT 214 185   BMET:  Recent Labs    08/17/19 0616 08/18/19 0857  NA 137 140  K 4.6 4.7  CL 94* 103  CO2 29 21*  GLUCOSE 93 140*  BUN 53* 71*  CREATININE 2.01* 2.63*  CALCIUM 13.2* 12.1*    PT/INR:  Recent Labs    07/30/2019 1153  LABPROT 16.0*  INR 1.3*   ABG No results found for: PHART, HCO3, TCO2, ACIDBASEDEF, O2SAT CBG (last 3)  Recent Labs    08/13/2019 1511  GLUCAP 101*    Assessment/Plan: S/P Procedure(s) (LRB): INSERTION PLEURAL DRAINAGE CATHETER (Left)  1. Pleurx cath drained 750cc yesterday. Another 1600cc out of the chest tube over 24 hours. CXR shows: Left  hydropneumothorax is similar in size with decreased pleural component and increased pneumothorax component. Overall volume loss in the left hemithorax is similar. 2. Left upper ext with new edema today. Upper extremity US to r/o DVT.   Plan: Will plan to disconnect pleurx from pleura vac and return to the original plan of draining once a day. Will start with daily bottle draining tomorrow morning.    LOS: 3 days    Elgie Collard 08/18/2019 Patient seen and examined. His effusion is nearly completely drained but his lung did not reexpand. Likely central obstruction from tumor mass.   Revonda Standard Roxan Hockey, MD [facility] [phone]

## 2019-08-19 ENCOUNTER — Ambulatory Visit
Admit: 2019-08-19 | Discharge: 2019-08-19 | Disposition: A | Discharge status: Home / Self Care | Payer: BC Managed Care – PPO | Attending: Radiation Oncology | Admitting: Radiation Oncology

## 2019-08-19 ENCOUNTER — Ambulatory Visit (HOSPITAL_COMMUNITY): Payer: BC Managed Care – PPO | Admitting: Hematology

## 2019-08-19 ENCOUNTER — Inpatient Hospital Stay (HOSPITAL_COMMUNITY): Payer: BC Managed Care – PPO

## 2019-08-19 ENCOUNTER — Encounter (HOSPITAL_COMMUNITY): Admission: EM | Disposition: E | Payer: Self-pay | Source: Home / Self Care | Attending: Internal Medicine

## 2019-08-19 ENCOUNTER — Ambulatory Visit: Payer: BC Managed Care – PPO | Admitting: Cardiothoracic Surgery

## 2019-08-19 ENCOUNTER — Inpatient Hospital Stay (HOSPITAL_COMMUNITY): Payer: BC Managed Care – PPO | Admitting: Certified Registered"

## 2019-08-19 ENCOUNTER — Encounter (HOSPITAL_COMMUNITY): Payer: Self-pay | Admitting: Internal Medicine

## 2019-08-19 ENCOUNTER — Other Ambulatory Visit: Payer: Self-pay

## 2019-08-19 DIAGNOSIS — J9600 Acute respiratory failure, unspecified whether with hypoxia or hypercapnia: Secondary | ICD-10-CM

## 2019-08-19 DIAGNOSIS — R570 Cardiogenic shock: Secondary | ICD-10-CM | POA: Diagnosis not present

## 2019-08-19 DIAGNOSIS — R609 Edema, unspecified: Secondary | ICD-10-CM

## 2019-08-19 DIAGNOSIS — C3492 Malignant neoplasm of unspecified part of left bronchus or lung: Secondary | ICD-10-CM

## 2019-08-19 DIAGNOSIS — C380 Malignant neoplasm of heart: Secondary | ICD-10-CM | POA: Diagnosis not present

## 2019-08-19 DIAGNOSIS — I429 Cardiomyopathy, unspecified: Secondary | ICD-10-CM

## 2019-08-19 DIAGNOSIS — F17210 Nicotine dependence, cigarettes, uncomplicated: Secondary | ICD-10-CM | POA: Diagnosis not present

## 2019-08-19 DIAGNOSIS — Z515 Encounter for palliative care: Secondary | ICD-10-CM | POA: Diagnosis not present

## 2019-08-19 DIAGNOSIS — I313 Pericardial effusion (noninflammatory): Secondary | ICD-10-CM | POA: Diagnosis not present

## 2019-08-19 DIAGNOSIS — J90 Pleural effusion, not elsewhere classified: Secondary | ICD-10-CM | POA: Diagnosis not present

## 2019-08-19 DIAGNOSIS — I10 Essential (primary) hypertension: Secondary | ICD-10-CM | POA: Diagnosis not present

## 2019-08-19 DIAGNOSIS — C384 Malignant neoplasm of pleura: Secondary | ICD-10-CM | POA: Diagnosis not present

## 2019-08-19 DIAGNOSIS — I319 Disease of pericardium, unspecified: Secondary | ICD-10-CM | POA: Diagnosis not present

## 2019-08-19 DIAGNOSIS — E785 Hyperlipidemia, unspecified: Secondary | ICD-10-CM | POA: Diagnosis not present

## 2019-08-19 DIAGNOSIS — J9601 Acute respiratory failure with hypoxia: Secondary | ICD-10-CM | POA: Diagnosis not present

## 2019-08-19 DIAGNOSIS — J3800 Paralysis of vocal cords and larynx, unspecified: Secondary | ICD-10-CM | POA: Diagnosis not present

## 2019-08-19 DIAGNOSIS — C3490 Malignant neoplasm of unspecified part of unspecified bronchus or lung: Secondary | ICD-10-CM | POA: Diagnosis not present

## 2019-08-19 DIAGNOSIS — C3412 Malignant neoplasm of upper lobe, left bronchus or lung: Secondary | ICD-10-CM | POA: Diagnosis not present

## 2019-08-19 DIAGNOSIS — J910 Malignant pleural effusion: Secondary | ICD-10-CM | POA: Diagnosis not present

## 2019-08-19 DIAGNOSIS — N179 Acute kidney failure, unspecified: Secondary | ICD-10-CM | POA: Diagnosis not present

## 2019-08-19 DIAGNOSIS — Z452 Encounter for adjustment and management of vascular access device: Secondary | ICD-10-CM | POA: Diagnosis not present

## 2019-08-19 DIAGNOSIS — I314 Cardiac tamponade: Secondary | ICD-10-CM | POA: Diagnosis not present

## 2019-08-19 HISTORY — PX: TEE WITHOUT CARDIOVERSION: SHX5443

## 2019-08-19 LAB — URINALYSIS, COMPLETE (UACMP) WITH MICROSCOPIC
Bilirubin Urine: NEGATIVE
Glucose, UA: NEGATIVE mg/dL
Hgb urine dipstick: NEGATIVE
Ketones, ur: NEGATIVE mg/dL
Leukocytes,Ua: NEGATIVE
Nitrite: NEGATIVE
Protein, ur: NEGATIVE mg/dL
Specific Gravity, Urine: 1.015 (ref 1.005–1.030)
pH: 5 (ref 5.0–8.0)

## 2019-08-19 LAB — BASIC METABOLIC PANEL
Anion gap: 14 (ref 5–15)
BUN: 82 mg/dL — ABNORMAL HIGH (ref 8–23)
CO2: 24 mmol/L (ref 22–32)
Calcium: 11.9 mg/dL — ABNORMAL HIGH (ref 8.9–10.3)
Chloride: 103 mmol/L (ref 98–111)
Creatinine, Ser: 3.27 mg/dL — ABNORMAL HIGH (ref 0.61–1.24)
GFR calc Af Amer: 22 mL/min — ABNORMAL LOW (ref >60)
GFR calc non Af Amer: 19 mL/min — ABNORMAL LOW (ref >60)
Glucose, Bld: 134 mg/dL — ABNORMAL HIGH (ref 70–99)
Potassium: 4.7 mmol/L (ref 3.5–5.1)
Sodium: 141 mmol/L (ref 135–145)

## 2019-08-19 LAB — CBC
HCT: 47.4 % (ref 39.0–52.0)
HCT: 41.1 % (ref 39.0–52.0)
Hemoglobin: 15.5 g/dL (ref 13.0–17.0)
Hemoglobin: 13.1 g/dL (ref 13.0–17.0)
MCH: 30.8 pg (ref 26.0–34.0)
MCH: 30 pg (ref 26.0–34.0)
MCHC: 32.7 g/dL (ref 30.0–36.0)
MCHC: 31.9 g/dL (ref 30.0–36.0)
MCV: 94.2 fL (ref 80.0–100.0)
MCV: 94.1 fL (ref 80.0–100.0)
Platelets: 171 10*3/uL (ref 150–400)
Platelets: 142 10*3/uL — ABNORMAL LOW (ref 150–400)
RBC: 5.03 MIL/uL (ref 4.22–5.81)
RBC: 4.37 MIL/uL (ref 4.22–5.81)
RDW: 14.7 % (ref 11.5–15.5)
RDW: 14.6 % (ref 11.5–15.5)
WBC: 20.4 10*3/uL — ABNORMAL HIGH (ref 4.0–10.5)
WBC: 16.1 10*3/uL — ABNORMAL HIGH (ref 4.0–10.5)
nRBC: 0 % (ref 0.0–0.2)
nRBC: 0 % (ref 0.0–0.2)

## 2019-08-19 LAB — ECHO INTRAOPERATIVE TEE
Height: 73 in
Weight: 3615.54 oz

## 2019-08-19 LAB — ECHOCARDIOGRAM COMPLETE
Height: 73 in
Weight: 3615.54 oz

## 2019-08-19 LAB — CORTISOL: Cortisol, Plasma: 65.5 ug/dL

## 2019-08-19 LAB — ABO/RH: ABO/RH(D): O POS

## 2019-08-19 LAB — CYTOLOGY - NON PAP

## 2019-08-19 SURGERY — CREATION, PERICARDIAL WINDOW, ANTERIOR THORACOTOMY APPROACH
Anesthesia: General | Laterality: Left

## 2019-08-19 MED ORDER — SODIUM CHLORIDE 0.9 % IV SOLN: INTRAVENOUS | Status: DC | PRN

## 2019-08-19 MED ORDER — FENTANYL CITRATE (PF) 250 MCG/5ML IJ SOLN
INTRAMUSCULAR | Status: AC
  Filled 2019-08-19: qty 5

## 2019-08-19 MED ORDER — FENTANYL CITRATE (PF) 100 MCG/2ML IJ SOLN
INTRAMUSCULAR | Status: AC
  Administered 2019-08-19: 100 ug via INTRAVENOUS
  Filled 2019-08-19: qty 2

## 2019-08-19 MED ORDER — CHLORHEXIDINE GLUCONATE CLOTH 2 % EX PADS
6.0000 | MEDICATED_PAD | Freq: Every day | CUTANEOUS | Status: DC
  Administered 2019-08-20 – 2019-08-22 (×3): 6 via TOPICAL

## 2019-08-19 MED ORDER — HEPARIN BOLUS VIA INFUSION
5000.0000 [IU] | Freq: Once | INTRAVENOUS | Status: AC
  Administered 2019-08-19: 10:00:00 5000 [IU] via INTRAVENOUS
  Filled 2019-08-19: qty 5000

## 2019-08-19 MED ORDER — MIDAZOLAM HCL 2 MG/2ML IJ SOLN
INTRAMUSCULAR | Status: AC
  Filled 2019-08-19: qty 2

## 2019-08-19 MED ORDER — FENTANYL CITRATE (PF) 100 MCG/2ML IJ SOLN
INTRAMUSCULAR | Status: DC | PRN
  Administered 2019-08-19: 100 ug via INTRAVENOUS

## 2019-08-19 MED ORDER — LACTATED RINGERS IV SOLN: INTRAVENOUS | Status: DC | PRN

## 2019-08-19 MED ORDER — SODIUM CHLORIDE 0.9 % IV BOLUS: 250.0000 mL | Freq: Once | INTRAVENOUS | Status: DC

## 2019-08-19 MED ORDER — MIDAZOLAM HCL 5 MG/5ML IJ SOLN
INTRAMUSCULAR | Status: DC | PRN
  Administered 2019-08-19: 2 mg via INTRAVENOUS

## 2019-08-19 MED ORDER — ROCURONIUM BROMIDE 50 MG/5ML IV SOSY
PREFILLED_SYRINGE | INTRAVENOUS | Status: DC | PRN
  Administered 2019-08-19 (×3): 50 mg via INTRAVENOUS

## 2019-08-19 MED ORDER — FENTANYL CITRATE (PF) 100 MCG/2ML IJ SOLN: 100.0000 ug | Freq: Once | INTRAMUSCULAR | Status: AC

## 2019-08-19 MED ORDER — CHLORHEXIDINE GLUCONATE 0.12% ORAL RINSE (MEDLINE KIT)
15.0000 mL | Freq: Two times a day (BID) | OROMUCOSAL | Status: DC
  Administered 2019-08-19 – 2019-08-20 (×2): 15 mL via OROMUCOSAL

## 2019-08-19 MED ORDER — CEFAZOLIN SODIUM-DEXTROSE 2-4 GM/100ML-% IV SOLN
INTRAVENOUS | Status: AC
  Filled 2019-08-19: qty 100

## 2019-08-19 MED ORDER — NOREPINEPHRINE 4 MG/250ML-% IV SOLN
INTRAVENOUS | Status: DC | PRN
  Administered 2019-08-19: 18 ug/min via INTRAVENOUS

## 2019-08-19 MED ORDER — HEMOSTATIC AGENTS (NO CHARGE) OPTIME
TOPICAL | Status: DC | PRN
  Administered 2019-08-19: 1 via TOPICAL

## 2019-08-19 MED ORDER — SENNOSIDES 8.8 MG/5ML PO SYRP
5.0000 mL | ORAL_SOLUTION | Freq: Two times a day (BID) | ORAL | Status: DC
  Administered 2019-08-20 – 2019-08-22 (×4): 5 mL
  Filled 2019-08-19 (×4): qty 5

## 2019-08-19 MED ORDER — NOREPINEPHRINE 4 MG/250ML-% IV SOLN
INTRAVENOUS | Status: AC
  Filled 2019-08-19: qty 250

## 2019-08-19 MED ORDER — DEXMEDETOMIDINE HCL IN NACL 400 MCG/100ML IV SOLN
0.4000 ug/kg/h | INTRAVENOUS | Status: DC
  Administered 2019-08-19 – 2019-08-20 (×2): 0.4 ug/kg/h via INTRAVENOUS
  Filled 2019-08-19 (×2): qty 100

## 2019-08-19 MED ORDER — CEFAZOLIN SODIUM-DEXTROSE 2-3 GM-%(50ML) IV SOLR
INTRAVENOUS | Status: DC | PRN
  Administered 2019-08-19: 2 g via INTRAVENOUS

## 2019-08-19 MED ORDER — CEFAZOLIN SODIUM-DEXTROSE 2-4 GM/100ML-% IV SOLN
2.0000 g | Freq: Once | INTRAVENOUS | Status: AC
  Administered 2019-08-20: 02:00:00 2 g via INTRAVENOUS
  Filled 2019-08-19: qty 100

## 2019-08-19 MED ORDER — SODIUM CHLORIDE 0.9 % IV SOLN: 10.0000 mL/h | Freq: Once | INTRAVENOUS | Status: DC

## 2019-08-19 MED ORDER — ETOMIDATE 2 MG/ML IV SOLN
20.0000 mg | Freq: Once | INTRAVENOUS | Status: AC
  Administered 2019-08-19: 14:00:00 20 mg via INTRAVENOUS

## 2019-08-19 MED ORDER — HEPARIN (PORCINE) 25000 UT/250ML-% IV SOLN
1700.0000 [IU]/h | INTRAVENOUS | Status: DC
  Administered 2019-08-19: 10:00:00 1700 [IU]/h via INTRAVENOUS
  Filled 2019-08-19 (×2): qty 250

## 2019-08-19 MED ORDER — BUPIVACAINE LIPOSOME 1.3 % IJ SUSP
20.0000 mL | Freq: Once | INTRAMUSCULAR | Status: DC
  Filled 2019-08-19: qty 20

## 2019-08-19 MED ORDER — ALBUMIN HUMAN 5 % IV SOLN: INTRAVENOUS | Status: DC | PRN

## 2019-08-19 MED ORDER — DEXMEDETOMIDINE HCL IN NACL 200 MCG/50ML IV SOLN
INTRAVENOUS | Status: DC | PRN
  Administered 2019-08-19: .5 ug/kg/h via INTRAVENOUS

## 2019-08-19 MED ORDER — ROCURONIUM BROMIDE 50 MG/5ML IV SOLN
50.0000 mg | Freq: Once | INTRAVENOUS | Status: AC
  Filled 2019-08-19: qty 5

## 2019-08-19 MED ORDER — HEPARIN (PORCINE) 25000 UT/250ML-% IV SOLN
1000.0000 [IU]/h | INTRAVENOUS | Status: DC
  Administered 2019-08-19: 20:00:00 1700 [IU]/h via INTRAVENOUS
  Administered 2019-08-20: 13:00:00 1400 [IU]/h via INTRAVENOUS
  Administered 2019-08-21: 13:00:00 1000 [IU]/h via INTRAVENOUS
  Filled 2019-08-19 (×2): qty 250

## 2019-08-19 MED ORDER — BUPIVACAINE LIPOSOME 1.3 % IJ SUSP
INTRAMUSCULAR | Status: DC | PRN
  Administered 2019-08-19: 20 mL

## 2019-08-19 MED ORDER — 0.9 % SODIUM CHLORIDE (POUR BTL) OPTIME
TOPICAL | Status: DC | PRN
  Administered 2019-08-19: 2000 mL

## 2019-08-19 MED ORDER — NOREPINEPHRINE 4 MG/250ML-% IV SOLN
0.0000 ug/min | INTRAVENOUS | Status: DC
  Filled 2019-08-19 (×2): qty 250

## 2019-08-19 MED ORDER — SODIUM CHLORIDE 0.9 % IV BOLUS
1000.0000 mL | Freq: Once | INTRAVENOUS | Status: AC
  Administered 2019-08-19: 1000 mL via INTRAVENOUS

## 2019-08-19 MED ORDER — ORAL CARE MOUTH RINSE
15.0000 mL | OROMUCOSAL | Status: DC
  Administered 2019-08-19 – 2019-08-20 (×6): 15 mL via OROMUCOSAL

## 2019-08-19 MED ORDER — PLASMA-LYTE 148 IV SOLN
INTRAVENOUS | Status: DC
  Filled 2019-08-19 (×3): qty 500

## 2019-08-19 SURGICAL SUPPLY — 59 items
ADH SKN CLS APL DERMABOND .7 (GAUZE/BANDAGES/DRESSINGS) ×1
APL SKNCLS STERI-STRIP NONHPOA (GAUZE/BANDAGES/DRESSINGS) ×1
BENZOIN TINCTURE PRP APPL 2/3 (GAUZE/BANDAGES/DRESSINGS) ×2 IMPLANT
BLADE CLIPPER SURG (BLADE) ×2 IMPLANT
CANISTER SUCT 3000ML PPV (MISCELLANEOUS) ×2 IMPLANT
CNTNR URN SCR LID CUP LEK RST (MISCELLANEOUS) IMPLANT
CONN ST 1/4X3/8  BEN (MISCELLANEOUS) ×1
CONN ST 1/4X3/8 BEN (MISCELLANEOUS) ×1 IMPLANT
CONT SPEC 4OZ CLIKSEAL STRL BL (MISCELLANEOUS) ×1 IMPLANT
CONT SPEC 4OZ STRL OR WHT (MISCELLANEOUS) ×2
COVER SURGICAL LIGHT HANDLE (MISCELLANEOUS) ×4 IMPLANT
DERMABOND ADVANCED (GAUZE/BANDAGES/DRESSINGS) ×1
DERMABOND ADVANCED .7 DNX12 (GAUZE/BANDAGES/DRESSINGS) ×1 IMPLANT
DRAIN CHANNEL 19F RND (DRAIN) ×1 IMPLANT
DRAIN CHANNEL 28F RND 3/8 FF (WOUND CARE) ×2 IMPLANT
DRAIN CONNECTOR BLAKE 1:1 (MISCELLANEOUS) ×1 IMPLANT
DRAPE CHEST BREAST 15X10 FENES (DRAPES) ×1 IMPLANT
DRAPE HALF SHEET 40X57 (DRAPES) ×2 IMPLANT
DRAPE LAPAROSCOPIC ABDOMINAL (DRAPES) ×2 IMPLANT
ELECT REM PT RETURN 9FT ADLT (ELECTROSURGICAL) ×2
ELECTRODE REM PT RTRN 9FT ADLT (ELECTROSURGICAL) ×1 IMPLANT
GAUZE SPONGE 4X4 12PLY STRL (GAUZE/BANDAGES/DRESSINGS) IMPLANT
GAUZE SPONGE 4X4 12PLY STRL LF (GAUZE/BANDAGES/DRESSINGS) ×1 IMPLANT
GLOVE BIOGEL PI IND STRL 7.5 (GLOVE) IMPLANT
GLOVE BIOGEL PI INDICATOR 7.5 (GLOVE) ×1
GLOVE NEODERM STRL 7.5 LF PF (GLOVE) ×2 IMPLANT
GLOVE SURG NEODERM 7.5  LF PF (GLOVE) ×1
GLOVE SURG SS PI 7.0 STRL IVOR (GLOVE) ×1 IMPLANT
KIT BASIN OR (CUSTOM PROCEDURE TRAY) ×2 IMPLANT
KIT TURNOVER KIT B (KITS) ×2 IMPLANT
NEEDLE 22X1 1/2 (OR ONLY) (NEEDLE) ×3 IMPLANT
NS IRRIG 1000ML POUR BTL (IV SOLUTION) ×2 IMPLANT
PACK CHEST (CUSTOM PROCEDURE TRAY) ×2 IMPLANT
PAD ARMBOARD 7.5X6 YLW CONV (MISCELLANEOUS) ×4 IMPLANT
PAD ELECT DEFIB RADIOL ZOLL (MISCELLANEOUS) ×2 IMPLANT
SET DRAINAGE LINE (MISCELLANEOUS) ×1 IMPLANT
STRIP CLOSURE SKIN 1/2X4 (GAUZE/BANDAGES/DRESSINGS) ×2 IMPLANT
SUT MNCRL AB 4-0 PS2 18 (SUTURE) ×2 IMPLANT
SUT PDS AB 1 CTX 36 (SUTURE) ×2 IMPLANT
SUT SILK  1 MH (SUTURE) ×1
SUT SILK 1 MH (SUTURE) ×1 IMPLANT
SUT SILK 2 0 SH CR/8 (SUTURE) ×2 IMPLANT
SUT VIC AB 0 CT1 27 (SUTURE) ×2
SUT VIC AB 0 CT1 27XBRD ANBCTR (SUTURE) ×1 IMPLANT
SUT VIC AB 2-0 CT1 27 (SUTURE) ×2
SUT VIC AB 2-0 CT1 TAPERPNT 27 (SUTURE) IMPLANT
SUT VIC AB 2-0 CTX 27 (SUTURE) ×1 IMPLANT
SWAB COLLECTION DEVICE MRSA (MISCELLANEOUS) IMPLANT
SWAB CULTURE ESWAB REG 1ML (MISCELLANEOUS) IMPLANT
SYR 10ML LL (SYRINGE) ×2 IMPLANT
SYR CONTROL 10ML LL (SYRINGE) ×1 IMPLANT
SYSTEM SAHARA CHEST DRAIN ATS (WOUND CARE) ×2 IMPLANT
TAPE CLOTH SURG 4X10 WHT LF (GAUZE/BANDAGES/DRESSINGS) ×1 IMPLANT
TOWEL GREEN STERILE (TOWEL DISPOSABLE) ×2 IMPLANT
TOWEL GREEN STERILE FF (TOWEL DISPOSABLE) ×2 IMPLANT
TRAP SPECIMEN MUCOUS 40CC (MISCELLANEOUS) ×2 IMPLANT
TRAY FOLEY SLVR 16FR TEMP STAT (SET/KITS/TRAYS/PACK) ×2 IMPLANT
WATER STERILE IRR 1000ML POUR (IV SOLUTION) ×4 IMPLANT
YANKAUER SUCT BULB TIP NO VENT (SUCTIONS) ×1 IMPLANT

## 2019-08-19 NOTE — Consult Note (Addendum)
[name] Consultation Note  Requesting MD:  Ina Homes, MD Indication for Consultation:  AKI  Chief complaint: shortness of breath   HPI:  [name] is a 64 y.o. male with a history of stage IV lung cancer, tobacco abuse, and recent covid infection (diagonsed on 1/27 and assessed as being out of the infectious window) who presented to the [facility] with shortness of breath.  The stage IV lung cancer is a relatively new diagnosis and has also had recurrent left pleural effusion which has required multiple thoracenteses.  He had a thoracenteses in the ER at [name] with removal of 1.5 L of bloody fluid.  He has been transferred to [name] and is in the ICU.   He had a Pleurx catheter placement on 2/9 by thoracic surgery.  His course was complicated by hypotension at that time.  Blood pressures charted as being as low as the 70s.  He received pressors and 3 doses of albumin.  Per pulmonology/critical care, they are trying to get the patient to [name] for radiation if possible.  Note that his course has also been complicated by hypercalcemia.  Calcium has improved from 13.9 on admission.  He had 450 mL of urine over 2/20.    On arrival to unit, patient found to be acutely hypotensive 40'J systolic and below and obtunded and pulm was just notified of pericardial effusion.  Nursing applying pads to patient.  His wife would want for him to have CRRT when needed.     Creat  Date/Time Value Ref Range Status  07/13/2013 09:07 AM 1.10 0.50 - 1.35 mg/dL Final   Creatinine, Ser  Date/Time Value Ref Range Status  07/29/2019 02:26 AM 3.27 (H) 0.61 - 1.24 mg/dL Final  08/18/2019 08:57 AM 2.63 (H) 0.61 - 1.24 mg/dL Final  08/17/2019 06:16 AM 2.01 (H) 0.61 - 1.24 mg/dL Final  08/18/2019 02:55 AM 1.09 0.61 - 1.24 mg/dL Final  08/07/2019 09:46 AM 1.23 0.61 - 1.24 mg/dL Final  08/07/2019 08:06 PM 0.96 0.61 - 1.24 mg/dL Final  07/02/2019 09:38 AM 0.92 0.76 - 1.27  mg/dL Final  03/12/2019 10:41 AM 1.13 0.76 - 1.27 mg/dL Final  05/13/2016 10:27 AM 1.27 0.76 - 1.27 mg/dL Final  10/18/2014 08:57 AM 1.09 0.76 - 1.27 mg/dL Final    PMHx:   Past Medical History:  Diagnosis Date  . ED (erectile dysfunction)   . Hyperlipidemia   . Hypertension   . Lung cancer [facility])     Past Surgical History:  Procedure Laterality Date  . APPENDECTOMY    . CHEST TUBE INSERTION Left 08/23/2019   Procedure: INSERTION PLEURAL DRAINAGE CATHETER;  Surgeon: Melrose Nakayama, MD;  Location: [place];  Service: Thoracic;  Laterality: Left;  . COLONOSCOPY      Family Hx:  Unable to obtain secondary to   Social History:  reports that he has been smoking. He has never used smokeless tobacco. No history on file for alcohol and drug.  Allergies: No Known Allergies  Medications: Prior to Admission medications   Medication Sig Start Date End Date Taking? Authorizing Provider  ascorbic acid (VITAMIN C) 500 MG tablet Take 500 mg by mouth every morning.   Yes [provider]  aspirin EC 81 MG tablet Take 81 mg by mouth every morning.    Yes [provider]  hydrochlorothiazide (HYDRODIURIL) 25 MG tablet One tablet daily in morning Patient taking differently: Take 25 mg by mouth every morning.  03/11/19  Yes Mikey Kirschner, MD  HYDROcodone-acetaminophen (NORCO/VICODIN) 5-325 MG tablet Take 1 tablet by mouth every 8 (eight) hours as needed for moderate pain. 08/05/19  Yes Derek Jack, MD  lisinopril (ZESTRIL) 40 MG tablet Take 1 tablet (40 mg total) by mouth daily. Patient taking differently: Take 40 mg by mouth every evening.  03/11/19  Yes Mikey Kirschner, MD  Multiple Vitamin (MULTIVITAMIN) tablet Take 1 tablet by mouth every morning.    Yes [provider]  pravastatin (PRAVACHOL) 20 MG tablet TAKE 1 TABLET (20 MG TOTAL) BY MOUTH DAILY. Patient taking differently: Take 20 mg by mouth every evening.  03/11/19  Yes Mikey Kirschner, MD   TRAVATAN Z 0.004 % SOLN ophthalmic solution Place 1 drop into both eyes daily.  10/05/15  Yes [provider]    I have reviewed the patient's current and prior to admission medications.  Labs:  BMP Latest Ref Rng & Units 08/17/2019 08/18/2019 08/17/2019  Glucose 70 - 99 mg/dL 134(H) 140(H) 93  BUN 8 - 23 mg/dL 82(H) 71(H) 53(H)  Creatinine 0.61 - 1.24 mg/dL 3.27(H) 2.63(H) 2.01(H)  BUN/Creat Ratio 10 - 24 - - -  Sodium 135 - 145 mmol/L 141 140 137  Potassium 3.5 - 5.1 mmol/L 4.7 4.7 4.6  Chloride 98 - 111 mmol/L 103 103 94(L)  CO2 22 - 32 mmol/L 24 21(L) 29  Calcium 8.9 - 10.3 mg/dL 11.9(H) 12.1(H) 13.2(HH)    Urinalysis No results found for: COLORURINE, APPEARANCEUR, LABSPEC, PHURINE, GLUCOSEU, HGBUR, BILIRUBINUR, KETONESUR, PROTEINUR, UROBILINOGEN, NITRITE, LEUKOCYTESUR   ROS:  Unable to obtain secondary to ams/acute illness   Physical Exam: Vitals:   08/03/2019 1215 08/01/2019 1245  BP: 106/78 (!) 90/49  Pulse: (!) 115 (!) 123  Resp: 18 14  Temp:    SpO2: 97% 96%     General: adult male in bed critically ill  HEENT:  NCAT Eyes: sclera anicteric Neck: trachea midline Heart:reduced heart sounds; tachy  Lungs: reduced breath sounds Abdomen:  Distended;soft; unable to assess tenderness Extremities: edema bilaterally  Skin: no rash on extremities exposed Neuro: unable to follow commands on exam; per nursing had been speaking earlier  Assessment/Plan:  # Acute renal failure Likely secondary to ischemic and prerenal insults from setting of hypotension with pericardial effusion/tamponade as well as hypercalcemia.  Renal US with echogenic kidneys without hydro.  On lisinopril and HCTZ at home  - Send UA and up/c ratio when able - can discontinue plasmalyte  - normal saline once now    - anticipate need for CRRT - team is contacting us back once patient stabilized - about to undergo pericardiocentesis. His wife would want for him to have CRRT when needed.    # Shock  cardiogenic  - pulm is transitioning to levophed and arranging for drainage of effusion - normal saline once now   # pericardial effusion  - Pulmonary made aware of emergent finding with plans for drainage - they are coordinating  # Acute hypoxic respiratory failure - On supplemental oxygen - escalating now  - Pulm following  # Hypercalcemia -Setting of malignancy - improving - s/p calcitonin   # Stage IV lung cancer - Primary team has consulted onc and rad onc   # hx of Covid positive - Test was on 1/27.  Patient now out of infectious window per pulm   Claudia Desanctis 08/10/2019, 2:12 PM   Spoke with pulmonology.  Hemodynamics improved after operative intervention/drainage of pericardial effusion.  They would like  to hold off on renal replacement therapy for right now and reassess tomorrow AM.  Claudia Desanctis 08/02/2019 4:33 PM

## 2019-08-19 NOTE — Consult Note (Signed)
Radiation Oncology         [phone] ________________________________  Name: [name]        MRN: [id number]  Date of Service: 07/29/2019 DOB: 01/13/1956  LO:VFIEPP, Grace Bushy, MD  Dr. Delton Coombes.   REFERRING PHYSICIAN: Dr. Erskine Emery  DIAGNOSIS: The primary encounter diagnosis was Pleural effusion. Diagnoses of Hypercalcemia, Surgery, elective, Pleural effusion on left, and AKI (acute kidney injury) ([place]) were also pertinent to this visit.   HISTORY OF PRESENT ILLNESS: [name] is a 64 y.o. male seen at the request of Dr. Tamala Julian in pulmonary medicine for a recently diagnosed lung cancer involving the LUL complicated by stage IV disease, specifically a large left effusion.  Apparently the patient had approximately 30 pounds of weight loss in the last few months, increasing shortness of breath with exertion and hoarseness.  He recently quit smoking in this timeframe, a chest x-ray on 07/02/2019 showed a left lung mass with a pleural effusion on the left side.  CT imaging on 07/12/2019 revealed left upper lobe endobronchial displacement and obstruction as well as trace right-sided effusion and moderate left-sided effusion with left pleural implants including a 2.2 cm implant posteriorly and 2.6 cm anteriorly.  Right sided pulmonary nodules were also seen, and the left upper lobe mass was also obstructing into the lingula and mediastinum measuring 11.5 x 7.7 cm.  Stable left third rib appeared to be consistent with metastatic disease, and a PET scan on 07/22/2019  revealed stage IV disease within the nodes not only in the thoracic nodal stations but extending to the upper abdominal and retroperitoneal lymph nodes.  He also had mild left lower cervical lymph nodes.  Posterior right vocal cord abnormality was also seen as a focal area of hypermetabolism.  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  He was getting ready to proceed with thoracentesis as well which was also performed on 07/24/2019.  He has had difficulty with breathing since that time, he underwent Pleurx catheter placement on 08/14/2019 in the operating room with Dr. Roxan Hockey, apparently hypotension has been a difficult symptom during the procedure and in some cases subsequent.  He has not started any systemic therapy yet, and we have been asked to consider palliative radiotherapy while awaiting systemic therapy.    PREVIOUS RADIATION THERAPY: No   PAST MEDICAL HISTORY:  Past Medical History:  Diagnosis Date  . ED (erectile dysfunction)   . Hyperlipidemia   . Hypertension   . Lung cancer ([place])        PAST SURGICAL HISTORY: Past Surgical History:  Procedure Laterality Date  . APPENDECTOMY    . CHEST TUBE INSERTION Left 08/03/2019   Procedure: INSERTION PLEURAL DRAINAGE CATHETER;  Surgeon: Melrose Nakayama, MD;  Location: [facility];  Service: Thoracic;  Laterality: Left;  . COLONOSCOPY       FAMILY HISTORY: History reviewed. No pertinent family history.   SOCIAL HISTORY:  reports that he has been smoking. He has never used smokeless tobacco. The patient is married and lives in [place]. He was working at [employer]. He has 3 grown children.    ALLERGIES: Patient has no known allergies.   MEDICATIONS:  Current Facility-Administered Medications  Medication Dose Route Frequency Provider Last Rate Last Admin  . acetaminophen (TYLENOL) tablet 650 mg  650 mg Oral Q6H PRN Melrose Nakayama, MD   650 mg at 08/10/2019  1438   Or  . acetaminophen (TYLENOL) suppository 650 mg  650 mg Rectal Q6H PRN Melrose Nakayama, MD      . ascorbic acid (VITAMIN C) tablet 500 mg  500 mg Oral q morning - 10a Melrose Nakayama, MD   500 mg at 08/18/19 0941  . aspirin EC tablet 81 mg  81 mg Oral q morning - 10a Melrose Nakayama, MD   81 mg at 08/18/19 0941  .  Chlorhexidine Gluconate Cloth 2 % PADS 6 each  6 each Topical Daily Candee Furbish, MD   6 each at 08/18/19 1441  . electrolyte-148 (PLASMALYTE-148) infusion   Intravenous Continuous Candee Furbish, MD      . feeding supplement (ENSURE ENLIVE) (ENSURE ENLIVE) liquid 237 mL  237 mL Oral BID BM Domenic Polite, MD   237 mL at 08/18/19 1453  . heparin injection 5,000 Units  5,000 Units Subcutaneous Q8H Candee Furbish, MD   5,000 Units at 08/11/2019 0503  . latanoprost (XALATAN) 0.005 % ophthalmic solution 1 drop  1 drop Both Eyes QHS Melrose Nakayama, MD   1 drop at 08/18/19 2219  . MEDLINE mouth rinse  15 mL Mouth Rinse BID Domenic Polite, MD   15 mL at 08/18/19 2223  . midodrine (PROAMATINE) tablet 10 mg  10 mg Oral TID WC Candee Furbish, MD   10 mg at 08/14/2019 6314  . multivitamin with minerals tablet 1 tablet  1 tablet Oral q morning - 10a Melrose Nakayama, MD   1 tablet at 08/18/19 [phone]  . ondansetron (ZOFRAN) tablet 4 mg  4 mg Oral Q6H PRN Melrose Nakayama, MD       Or  . ondansetron [facility]) injection 4 mg  4 mg Intravenous Q6H PRN Melrose Nakayama, MD      . phenylephrine (NEOSYNEPHRINE) 10-0.9 MG/250ML-% infusion  0-400 mcg/min Intravenous Titrated Candee Furbish, MD 105 mL/hr at 08/18/2019 0800 70 mcg/min at 08/18/2019 0800  . pravastatin (PRAVACHOL) tablet 20 mg  20 mg Oral QPM Melrose Nakayama, MD   20 mg at 08/18/19 1731  . senna-docusate (Senokot-S) tablet 1 tablet  1 tablet Oral BID Candee Furbish, MD   1 tablet at 08/18/19 [phone]  . traMADol (ULTRAM) tablet 100 mg  100 mg Oral Q6H PRN Melrose Nakayama, MD   100 mg at 08/05/2019 5885     REVIEW OF SYSTEMS: On review of systems, the patient's wife provides his history. She reports that he is somewhat confused today and is fidgety pulling at his telemetry wires and IV. He continues to be short of breath with minimal effort, and she thinks he is also confused when his BP is low. She reports that he is unable to  talk or verbalize much because of his hoarseness and vocal cord paralysis. He has lost about 30 pounds in the last three months. No other complaints are verbalized.     PHYSICAL EXAM:  Unable to assess due to encounter type.   ECOG = 3  0 - Asymptomatic (Fully active, able to carry on all predisease activities without restriction)  1 - Symptomatic but completely ambulatory (Restricted in physically strenuous activity but ambulatory and able to carry out work of a light or sedentary nature. For example, light housework, office work)  2 - Symptomatic, <50% in bed during the day (Ambulatory and capable of all self care but unable to carry out any work activities. Up and about more  than 50% of waking hours)  3 - Symptomatic, >50% in bed, but not bedbound (Capable of only limited self-care, confined to bed or chair 50% or more of waking hours)  4 - Bedbound (Completely disabled. Cannot carry on any self-care. Totally confined to bed or chair)  5 - Death   [name] MM, Creech RH, Tormey DC, et al. [phone]). "Toxicity and response criteria of the [facility]". Lexa Oncol. 5 (6): 649-55    LABORATORY DATA:  Lab Results  Component Value Date   WBC 20.4 (H) 08/04/2019   HGB 15.5 08/02/2019   HCT 47.4 08/18/2019   MCV 94.2 08/04/2019   PLT 171 07/30/2019   Lab Results  Component Value Date   NA 141 08/05/2019   K 4.7 08/11/2019   CL 103 08/03/2019   CO2 24 08/12/2019   Lab Results  Component Value Date   ALT 44 08/17/2019   AST 45 (H) 08/17/2019   ALKPHOS 80 08/17/2019   BILITOT 1.0 08/17/2019      RADIOGRAPHY: US RENAL  Result Date: 08/17/2019 CLINICAL DATA:  Acute kidney injury.  Inpatient. EXAM: RENAL / URINARY TRACT ULTRASOUND COMPLETE COMPARISON:  07/22/2019 PET-CT. FINDINGS: Right Kidney: Renal measurements: 11.7 x 6.1 x 6.7 cm = volume: 249 mL. Echogenic right renal parenchyma, normal thickness. No hydronephrosis. No renal mass. Left Kidney:  Renal measurements: 11.8 x 7.4 x 8.5 cm = volume: 388 mL. Echogenic left renal parenchyma, normal thickness. No hydronephrosis. No renal mass. Bladder: Nonspecific mild diffuse bladder wall thickening. No focal bladder abnormality demonstrated. Other: None. IMPRESSION: 1. No hydronephrosis. 2. Echogenic normal size kidneys, compatible with reported history of nonspecific acute renal parenchymal disease. 3. Nonspecific mild diffuse bladder wall thickening. Suggest correlation with urinalysis. Electronically Signed   By: Ilona Sorrel M.D.   On: 08/17/2019 08:42   NM PET Image Initial (PI) Skull Base To Thigh  Result Date: 07/22/2019 CLINICAL DATA:  Initial treatment strategy for metastatic left lung cancer. EXAM: NUCLEAR MEDICINE PET SKULL BASE TO THIGH TECHNIQUE: 12.3 mCi F-18 FDG was injected intravenously. Full-ring PET imaging was performed from the skull base to thigh after the radiotracer. CT data was obtained and used for attenuation correction and anatomic localization. Fasting blood glucose: 104 mg/dl COMPARISON:  CT chest dated 07/12/2019 FINDINGS: Mediastinal blood pool activity: SUV max 2.7 Liver activity: SUV max NA NECK: 9 mm short axis left lower cervical node (series 4/image 38), max SUV 5.5. Paretic left vocal cord. Associated mild hypermetabolism of the posterior right vocal fold (PET image 48), max SUV 7.4, favored to be physiologic. If clinically warranted given the additional findings, direct inspection could be considered. Incidental CT findings: none CHEST: Left upper lobe mass, compatible primary bronchogenic neoplasm and better evaluated on recent CT chest, max SUV 22.5. Direct invasion of the mediastinum. Associated moderate left pleural effusion with multifocal pleural-based metastases, including a dominant 2.0 x 5.8 cm lesion inferiorly (series 4/image 119), max SUV 14.6. Left lower lobe compressive atelectasis. Widespread thoracic metastases, including left supraclavicular, left  subpectoral/axillary, left superior mediastinal/prevascular/IMA, right paratracheal, subcarinal, retrocrural, and right hilar nodal metastases. Index 2.5 cm short axis node at the left thoracic inlet, max SUV 27.1. Scattered pulmonary nodules in the right lung, including a dominant 12 mm right upper lobe nodule (series 8/image 21), max SUV 5.8, compatible with pulmonary metastases. Incidental CT findings: Atherosclerotic calcifications of the aortic arch. Coronary atherosclerosis of the LAD. ABDOMEN/PELVIS: No abnormal hypermetabolism in the liver, spleen, pancreas, or  adrenal glands. Retroperitoneal/para-aortic lymphadenopathy in the upper abdomen, including a 1.9 cm short axis left anterior para-aortic node (series 4/image 127), max SUV 2.2. Incidental CT findings: Atherosclerotic calcifications the abdominal aorta and branch vessels. Suspected postsurgical changes related to prior TURP. SKELETON: 1.9 x 4.0 cm metastasis involving the left anterior 3rd rib (series 4/image 67), max SUV 25.6. Incidental CT findings: Degenerative changes of the lumbar spine. IMPRESSION: Left upper lobe mass, corresponding to primary bronchogenic neoplasm. Moderate left pleural effusion with pleural-based metastases, as above. Contralateral pulmonary metastases on the right. Widespread thoracic nodal metastases, as above. Associated mild upper abdominal/retroperitoneal nodal metastases. Mild left lower cervical nodal metastasis. Osseous metastasis involving the left anterior 3rd rib. 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 If clinically warranted given the additional findings, direct inspection could be considered. Electronically Signed   By: Julian Hy M.D.   On: 07/22/2019 14:44   CT Biopsy  Result Date: 07/24/2019 INDICATION: 64 year old male with a history metastatic disease, likely lung carcinoma. He has been referred for  biopsy of left anterior third rib lesion EXAM: CT BIOPSY MEDICATIONS: None. ANESTHESIA/SEDATION: Moderate (conscious) sedation was employed during this procedure. A total of Versed 2.0 mg and Fentanyl 100 mcg was administered intravenously. Moderate Sedation Time: 15 minutes. The patient's level of consciousness and vital signs were monitored continuously by radiology nursing throughout the procedure under my direct supervision. FLUOROSCOPY TIME:  CT COMPLICATIONS: None PROCEDURE: The procedure, risks, benefits, and alternatives were explained to the patient and the patient's family. Specific risks that were addressed included bleeding, infection, pneumothorax, need for further procedure including chest tube placement, chance of delayed pneumothorax or hemorrhage, hemoptysis, nondiagnostic sample, cardiopulmonary collapse, death. Questions regarding the procedure were encouraged and answered. The patient understands and consents to the procedure. Patient was positioned in the supine position on the CT gantry table and a scout CT of the chest was performed for planning purposes. Once angle of approach was determined, the skin and subcutaneous tissues this scan was prepped and draped in the usual sterile fashion, and a sterile drape was applied covering the operative field. A sterile gown and sterile gloves were used for the procedure. Local anesthesia was provided with 1% Lidocaine. The skin and subcutaneous tissues were infiltrated 1% lidocaine for local anesthesia, and a small stab incision was made with an 11 blade scalpel. Using CT guidance, a 17 gauge trocar needle was advanced into the left anterior third ribtarget. After confirmation of the tip, separate 18 gauge core biopsies were performed. These were placed into solution for transportation to the lab. Final CT was performed. Patient tolerated the procedure well and remained hemodynamically stable throughout. No complications were encountered and no  significant blood loss was encounter IMPRESSION: Status post CT-guided biopsy of left anterior third rib lesion. Tissue specimen sent to pathology for complete histopathologic analysis. Signed, Dulcy Fanny. Dellia Nims, RPVI [facility] Electronically Signed   By: Corrie Mckusick D.O.   On: 07/24/2019 11:20   DG Chest Port 1 View  Result Date: 08/18/2019 CLINICAL DATA:  Follow-up pleural effusion EXAM: PORTABLE CHEST 1 VIEW COMPARISON:  Chest radiograph from one day prior. FINDINGS: Left apical chest tube in place. Stable cardiomediastinal silhouette with normal heart size. No right pneumothorax. No significant right pleural effusion. Volume loss in the left hemithorax is similar. Left hydropneumothorax is overall similar in size with decreased pleural component and increased  pneumothorax component. Upper left parahilar masslike opacity is newly silhouetted. Stable apical right lung nodule. IMPRESSION: 1. Stable apical left chest tube position. Left hydropneumothorax is similar in size with decreased pleural component and increased pneumothorax component. Overall volume loss in the left hemithorax is similar. 2. Newly silhouetted left upper parahilar masslike opacity, compatible with known neoplasm. 3. Stable apical right lung nodule. Electronically Signed   By: Ilona Sorrel M.D.   On: 08/18/2019 06:58   DG Chest Port 1 View  Result Date: 08/17/2019 CLINICAL DATA:  Lung cancer, pleural effusion EXAM: PORTABLE CHEST 1 VIEW COMPARISON:  Chest radiograph from one day prior. FINDINGS: Complete opacification of the left hemithorax is unchanged. Stable left apical chest tube position. No pneumothorax. No right pleural effusion. Vague focal nodular opacity in the upper right lung is stable. Stable obscured cardiomediastinal silhouette. IMPRESSION: 1. Stable chest radiograph with complete opacification of the left hemithorax and left apical chest tube in place. No  pneumothorax. 2. Stable focal nodular opacity in the upper right lung. Electronically Signed   By: Ilona Sorrel M.D.   On: 08/17/2019 05:25   DG Chest Port 1 View  Result Date: 08/12/2019 CLINICAL DATA:  Follow-up chest tube. EXAM: PORTABLE CHEST 1 VIEW COMPARISON:  08/21/2019 FINDINGS: Interval placement of left chest tube. Complete opacification of the left hemithorax is unchanged. This reflects large left pleural effusion, the large left upper lobe lung mass and postobstructive changes. Similar appearance of right upper lobe lung nodule. IMPRESSION: 1. Interval placement of left chest tube. No change in complete opacification of the left hemithorax. 2. Stable right upper lobe lung nodule. Electronically Signed   By: Kerby Moors M.D.   On: 07/29/2019 12:35   DG Chest Port 1 View  Result Date: 08/09/2019 CLINICAL DATA:  Shortness of breath.  Lung carcinoma EXAM: PORTABLE CHEST 1 VIEW COMPARISON:  August 08, 2019 FINDINGS: There remains essentially complete opacification of the left hemithorax with shift of heart and mediastinum toward the right. Right lung remains clear. Heart size is grossly normal. No adenopathy evident on the right. Note that the left hilar and mediastinal regions are obscured by the opacification. No bone lesions. IMPRESSION: Complete opacification of the left hemithorax with shift of heart and mediastinum toward the right. Suspect large pleural effusion on the left. There may well be underlying mass and/or consolidation on the left. Right lung clear. Grossly stable cardiac silhouette. Electronically Signed   By: Lowella Grip III M.D.   On: 08/01/2019 09:53   DG Chest Port 1 View  Result Date: 08/08/2019 CLINICAL DATA:  Post LEFT thoracentesis, removal 1.2 L of fluid, metastatic lung cancer EXAM: PORTABLE CHEST 1 VIEW COMPARISON:  Portable exam at 1105 hrs compared to 08/07/2019 FINDINGS: Persistent complete opacification of the LEFT hemithorax despite pain at removal of  1.2 L of fluid. Slight mediastinal shift to the RIGHT. RIGHT lung clear. No pneumothorax. Osseous structures unremarkable. IMPRESSION: No pneumothorax following LEFT thoracentesis. Persistent diffuse opacification of the LEFT hemithorax despite thoracentesis, representing a combination of tumor, atelectatic lung and residual effusion. Electronically Signed   By: Lavonia Dana M.D.   On: 08/08/2019 11:46   DG Chest Port 1 View  Result Date: 08/07/2019 CLINICAL DATA:  The patient states increasing SOB for a few days. Patient has lung cancer and had a thoracentesis on 07/24/2019. Patient positive for Covid-19 on 07/24/2019. Hx of HTN and current smoker. EXAM: PORTABLE CHEST 1 VIEW COMPARISON:  Chest radiograph 07/23/2018 FINDINGS: Visualized portions of the  cardiomediastinal contours appear stable. Redemonstrated diffuse opacification of the left hemithorax with rightward mediastinal shift. The right lung appears clear. No right pleural effusion. No large pneumothorax. No acute finding in the visualized skeleton. IMPRESSION: Redemonstrated diffuse opacification of the left hemithorax with associated rightward mediastinal shift. Electronically Signed   By: Audie Pinto M.D.   On: 08/07/2019 20:31   DG Chest Port 1 View  Result Date: 07/24/2019 CLINICAL DATA:  Post thoracentesis on the left today. Metastatic lung cancer. COVID-19 infection. EXAM: PORTABLE CHEST 1 VIEW COMPARISON:  Radiographs 07/02/2019, CT 07/12/2019 and PET-CT 07/22/2019. FINDINGS: 1344 hours. Two views obtained. There is near complete whiteout of the left hemithorax, similar to the most recent PET-CT done 2 days ago. This corresponds with a residual large pleural effusion and subtotal collapse of the left lung. No evidence of pneumothorax. There is residual mild mediastinal shift to the right. Underlying right lung nodularity is noted. IMPRESSION: No evidence of pneumothorax following left thoracentesis. Persistent near complete whiteout of  the left hemithorax consistent with a large pleural effusion and subtotal left lung collapse as seen on recent PET-CT. Electronically Signed   By: Richardean Sale M.D.   On: 07/24/2019 13:58   DG Chest Port 1V same Day  Result Date: 08/14/2019 CLINICAL DATA:  Post left-sided thoracentesis. EXAM: PORTABLE CHEST 1 VIEW COMPARISON:  Earlier same day; 08/08/2019; 08/07/2019; chest CT-07/12/2019 FINDINGS: Persistent complete opacification of the left hemithorax following ultrasound-guided thoracentesis. There is persistent deviation of the mediastinal structures to the right. Pulmonary venous congestion within the aerated right lung without new focal airspace opacity. No pneumothorax. No acute osseous abnormalities. IMPRESSION: Persistent complete opacification of the left hemithorax following thoracentesis. No pneumothorax. Electronically Signed   By: Sandi Mariscal M.D.   On: 08/18/2019 11:29   DG Fluoro Guide CV Line-No Report  Result Date: 08/05/2019 Fluoroscopy was utilized by the requesting physician.  No radiographic interpretation.   US THORACENTESIS ASP PLEURAL SPACE W/IMG GUIDE  Result Date: 08/01/2019 INDICATION: Lung Ca Recurrent pleural effusion SOB EXAM: ULTRASOUND GUIDED Left THORACENTESIS MEDICATIONS: 10 cc 1% lidocaine. COMPLICATIONS: None immediate. PROCEDURE: An ultrasound guided thoracentesis was thoroughly discussed with the patient and questions answered. The benefits, risks, alternatives and complications were also discussed. The patient understands and wishes to proceed with the procedure. Written consent was obtained. Ultrasound was performed to localize and mark an adequate pocket of fluid in the left chest. The area was then prepped and draped in the normal sterile fashion. 1% Lidocaine was used for local anesthesia. Under ultrasound guidance a 19 g Yueh catheter was introduced. Thoracentesis was performed. The catheter was removed and a dressing applied. FINDINGS: A total of  approximately 1.5 liters of bloody fluid was removed. Samples were sent to the laboratory as requested by the clinical team. IMPRESSION: Successful ultrasound guided left thoracentesis yielding 1.5 liters of pleural fluid. Read by Lavonia Drafts [facility] Electronically Signed   By: Sandi Mariscal M.D.   On: 08/06/2019 11:17   US THORACENTESIS ASP PLEURAL SPACE W/IMG GUIDE  Result Date: 08/08/2019 INDICATION: Right pleural effusion Recurrent; malignant EXAM: ULTRASOUND GUIDED RIGHT THORACENTESIS MEDICATIONS: 10 cc 1% lidocaine COMPLICATIONS: None immediate. PROCEDURE: An ultrasound guided thoracentesis was thoroughly discussed with the patient and questions answered. The benefits, risks, alternatives and complications were also discussed. The patient understands and wishes to proceed with the procedure. Written consent was obtained. Ultrasound was performed to localize and mark an adequate pocket of fluid in the right chest. The area was then  prepped and draped in the normal sterile fashion. 1% Lidocaine was used for local anesthesia. Under ultrasound guidance a 19 g Yueh catheter was introduced. Thoracentesis was performed. The catheter was removed and a dressing applied. FINDINGS: A total of approximately 1.2 liters of blood tinged fluid was removed. IMPRESSION: Successful ultrasound guided right thoracentesis yielding 1.2 Liters of pleural fluid. Read by Lavonia Drafts [facility] Electronically Signed   By: Lavonia Dana M.D.   On: 08/08/2019 11:09   US THORACENTESIS ASP PLEURAL SPACE W/IMG GUIDE  Result Date: 07/24/2019 INDICATION: Patient with history of metastatic disease, likely lung carcinoma, dyspnea, COVID-19 positive, left pleural effusion. Request received for diagnostic and therapeutic left thoracentesis. EXAM: ULTRASOUND GUIDED DIAGNOSTIC AND THERAPEUTIC LEFT THORACENTESIS MEDICATIONS: None COMPLICATIONS: None immediate. PROCEDURE: An ultrasound guided thoracentesis was thoroughly discussed with the patient  and questions answered. The benefits, risks, alternatives and complications were also discussed. The patient understands and wishes to proceed with the procedure. Written consent was obtained. Ultrasound was performed to localize and mark an adequate pocket of fluid in the left chest. The area was then prepped and draped in the normal sterile fashion. 1% Lidocaine was used for local anesthesia. Under ultrasound guidance a 6 Fr Safe-T-Centesis catheter was introduced. Thoracentesis was performed. The catheter was removed and a dressing applied. FINDINGS: A total of approximately 1.3 liters of blood-tinged fluid was removed. Samples were sent to the laboratory as requested by the clinical team. Due to this being patient's initial thoracentesis only the above amount of fluid was removed today. IMPRESSION: Successful ultrasound guided diagnostic and therapeutic left thoracentesis yielding 1.3 liters of pleural fluid. Read by: Rowe Robert, PA-C Electronically Signed   By: Corrie Mckusick D.O.   On: 07/24/2019 13:45       IMPRESSION/PLAN: 1. 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  Have also been in contact with Dr. Lisbeth Renshaw Dr. Delton Coombes and Dr. Tamala Julian.  We discussed the options of considering palliative radiotherapy to his left upper lobe tumor which is compressing his airway, and disease mediastinum.  Mrs. Maricela Bo  and I discussed the rationale for radiotherapy, delivery and logistics of treatment as well as the risks, benefits, short and long-term effects of therapy.  She states that he will be more than ready to move forward to get some relief as he is in struggling with his breathing for quite some time now.  She is in agreement to meet back with Dr. Delton Coombes following his  hospitalization to pursue systemic therapy.  We discussed the planning process of radiotherapy and anticipate simulation to occur tomorrow morning.  I am also anticipating his treatment either starting tomorrow or Wednesday for a total of 10 fractions per Dr. Ida Rogue recommendations.  Given the patient's confusion the patient's wife has given verbal consent for [place] to proceed.  Her name will be signed on the consent form and a copy provided.  She is in agreement with this plan to move forward. 2. Confusion.  Given the patient's diagnosis and new onset of symptoms I think it is prudent for [place] to assess his brain with an MRI scan.  This was ordered stat.  He will likely need antianxiety medication provided that his pressures can tolerate this.  We will follow-up with the results, and if  brain disease is seen make recommendations for treatment.  In a visit lasting 60 minutes, greater than 50% of the time was spent by phone discussing the patient's condition, in preparation for the discussion, and coordinating the patient's care.     Carola Rhine, PAC

## 2019-08-19 NOTE — Procedures (Signed)
Central Venous Catheter Insertion Procedure Note COLEN ELTZROTH [id number] 1956/06/18  Procedure: Insertion of Central Venous Catheter Indications: Assessment of intravascular volume, Drug and/or fluid administration and Frequent blood sampling  Procedure Details Consent: Unable to obtain consent because of emergent medical necessity. Time Out: Verified patient identification, verified procedure, site/side was marked, verified correct patient position, special equipment/implants available, medications/allergies/relevent history reviewed, required imaging and test results available.  Performed  Maximum sterile technique was used including antiseptics, cap, gloves, gown, hand hygiene, mask and sheet. Skin prep: Chlorhexidine; local anesthetic administered A antimicrobial bonded/coated triple lumen catheter was placed in the left internal jugular vein using the Seldinger technique.  Evaluation Blood flow good Complications: No apparent complications Patient did tolerate procedure well. Chest X-ray ordered to verify placement.  CXR: normal.  Clementeen Graham 08/20/2019, 2:55 PM  Erick Colace ACNP-BC [place] Pager # [phone] OR # [phone] if no answer

## 2019-08-19 NOTE — Progress Notes (Signed)
  Echocardiogram 2D Echocardiogram has been performed.  [name] 07/30/2019, 1:43 PM

## 2019-08-19 NOTE — Brief Op Note (Signed)
08/12/2019 - 07/30/2019  4:16 PM  PATIENT:  [name]  64 y.o. male  PRE-OPERATIVE DIAGNOSIS:  pericardial effusion tamponade  POST-OPERATIVE DIAGNOSIS:  pericardial effusion tamponade  PROCEDURE:  Procedure(s): PERICARDIAL WINDOW WITH THORACOTOMY APPROACH (Left) TRANSESOPHAGEAL ECHOCARDIOGRAM (TEE) (Left)  SURGEON:  Surgeon(s) and Role:    * Wonda Olds, MD - Primary  PHYSICIAN ASSISTANT: Evonnie Pat PA-C  ASSISTANTS: staff   ANESTHESIA:   general  EBL:  500 mL   BLOOD ADMINISTERED:none  DRAINS: one 19Fr fluted Chest Tube(s) in the pericardial space   LOCAL MEDICATIONS USED:  OTHER exparel  SPECIMEN:  Source of Specimen:  pericardial fluid and pericardial tissue for cytology  DISPOSITION OF SPECIMEN:  PATHOLOGY  COUNTS:  YES  DICTATION: .Note written in EPIC  PLAN OF CARE: Admit to inpatient   PATIENT DISPOSITION:  ICU - intubated and critically ill.   Delay start of Pharmacological VTE agent (>24hrs) due to surgical blood loss or risk of bleeding: yes

## 2019-08-19 NOTE — Procedures (Signed)
Arterial Catheter Insertion Procedure Note HARRISON ZETINA [id number] [birth year]  Procedure: Insertion of Arterial Catheter  Indications: Blood pressure monitoring  Procedure Details Consent: Unable to obtain consent because of emergent medical necessity. Time Out: Verified patient identification, verified procedure, site/side was marked, verified correct patient position, special equipment/implants available, medications/allergies/relevent history reviewed, required imaging and test results available.  Performed  Maximum sterile technique was used including antiseptics, cap, gloves, gown, hand hygiene, mask and sheet. Skin prep: Chlorhexidine; local anesthetic administered 20 gauge catheter was inserted into right femoral artery using the Seldinger technique. ULTRASOUND GUIDANCE USED: YES Evaluation Blood flow good; BP tracing good. Complications: No apparent complications.   Clementeen Graham 08/14/2019   Erick Colace ACNP-BC [place] Pager # [phone] OR # [phone] if no answer

## 2019-08-19 NOTE — Progress Notes (Signed)
Patient ID: [name], male   DOB: 1956/01/25, 65 y.o.   MRN: [id number] TCTS Evening Rounds:  Hemodynamically stable Still on vent Chest tube output low, serosanguinous.

## 2019-08-19 NOTE — H&P (Signed)
History and Physical Interval Note:  08/10/2019 2:38 PM  [name]  has presented today for surgery, with the diagnosis of pericardial effusion/cardiac tamponad.  The various methods of treatment have been discussed with the patient and family. After consideration of risks, benefits and other options for treatment, the patient has consented to left anterior thoracotomy for pericardial window as a surgical intervention.  The patient's history has been reviewed, patient examined, no change in status, stable for surgery.  I have reviewed the patient's chart and labs.  Questions were answered to the patient's family's satisfaction.  The patient is not capable of providing consent due to intubation/sedation.   Wonda Olds

## 2019-08-19 NOTE — Anesthesia Preprocedure Evaluation (Signed)
Anesthesia Evaluation  Patient identified by MRN, date of birth, ID band Patient awake    Reviewed: Allergy & Precautions, H&P , NPO status , Patient's Chart, lab work & pertinent test results  Airway Mallampati: Intubated       Dental   Pulmonary Current Smoker,  Stage 4 lung CA. Recurrent pleural effusions. Pleural drain placed 08/18/2019.   breath sounds clear to auscultation       Cardiovascular hypertension,  Rhythm:regular Rate:Normal  Pericardial effusion/tamponade   Neuro/Psych    GI/Hepatic   Endo/Other    Renal/GU Renal InsufficiencyRenal disease     Musculoskeletal   Abdominal   Peds  Hematology   Anesthesia Other Findings   Reproductive/Obstetrics                             Anesthesia Physical Anesthesia Plan  ASA: IV and emergent  Anesthesia Plan: General   Post-op Pain Management:    Induction: Intravenous  PONV Risk Score and Plan: 1  Airway Management Planned: Oral ETT  Additional Equipment: Arterial line  Intra-op Plan:   Post-operative Plan: Post-operative intubation/ventilation  Informed Consent: I have reviewed the patients History and Physical, chart, labs and discussed the procedure including the risks, benefits and alternatives for the proposed anesthesia with the patient or authorized representative who has indicated his/her understanding and acceptance.       Plan Discussed with: CRNA, Anesthesiologist and Surgeon  Anesthesia Plan Comments:         Anesthesia Quick Evaluation

## 2019-08-19 NOTE — Brief Op Note (Signed)
07/31/2019 - 08/10/2019  4:18 PM  PATIENT:  [name]  64 y.o. male  PRE-OPERATIVE DIAGNOSIS:  pericardial effusion tamponade  POST-OPERATIVE DIAGNOSIS:  pericardial effusion tamponade  PROCEDURE:  Procedure(s): PERICARDIAL WINDOW WITH THORACOTOMY APPROACH (Left) TRANSESOPHAGEAL ECHOCARDIOGRAM (TEE) (Left)  SURGEON:  Surgeon(s) and Role:    * Wonda Olds, MD - Primary  PHYSICIAN ASSISTANT: Moyses Pavey PA-C  ANESTHESIA:   general  EBL:  100 CC  BLOOD ADMINISTERED:none  DRAINS: (#1 49F) Blake drain(s) in the PERICARDIUM   LOCAL MEDICATIONS USED:   EXPAREL  SPECIMEN:  Source of Specimen:  PERICARDIAL TUMOR AND FLUID  DISPOSITION OF SPECIMEN:  PATH AND CYTOLOGY  COUNTS:  YES  TOURNIQUET:  * No tourniquets in log *  DICTATION: .Dragon Dictation  PLAN OF CARE: Admit to inpatient   PATIENT DISPOSITION:  SICU   Delay start of Pharmacological VTE agent (>24hrs) due to surgical blood loss or risk of bleeding: yes

## 2019-08-19 NOTE — Transfer of Care (Signed)
Immediate Anesthesia Transfer of Care Note  Patient: [name]  Procedure(s) Performed: PERICARDIAL WINDOW WITH THORACOTOMY APPROACH (Left ) TRANSESOPHAGEAL ECHOCARDIOGRAM (TEE) (Left )  Patient Location: SICU  Anesthesia Type:General  Level of Consciousness: sedated and unresponsive  Airway & Oxygen Therapy: Patient remains intubated per anesthesia plan and Patient placed on Ventilator (see vital sign flow sheet for setting)  Post-op Assessment: Report given to RN and Post -op Vital signs reviewed and stable  Post vital signs: Reviewed and stable  Last Vitals:  Vitals Value Taken Time  BP 109/82 08/24/2019 1640  Temp    Pulse 113 08/04/2019 1642  Resp 65 08/05/2019 1642  SpO2 100 % 08/25/2019 1642  Vitals shown include unvalidated device data.  Last Pain:  Vitals:   08/18/2019 1050  TempSrc: Axillary  PainSc:       Patients Stated Pain Goal: 0 ([id number])  Complications: No apparent anesthesia complications

## 2019-08-19 NOTE — Op Note (Signed)
Procedure(s): PERICARDIAL WINDOW WITH THORACOTOMY APPROACH TRANSESOPHAGEAL ECHOCARDIOGRAM (TEE) Procedure Note  ELDON ZIETLOW male 64 y.o. 08/12/2019  Procedure(s) and Anesthesia Type:    * PERICARDIAL WINDOW WITH THORACOTOMY APPROACH - General    * TRANSESOPHAGEAL ECHOCARDIOGRAM (TEE) - General  Surgeon(s) and Role:    * Wonda Olds, MD - Primary   Indications: The patient was admitted to the hospital with symptomatic left effusion found to be malignant and is s/p left PleurRx catheter by Dr. Roxan Hockey. He experienced hemodynamic collapse today, work-up showing large pericardial effusion with tamponade physiology. Taken to the OR urgently for drainage after discussing with Mr. Rittenberry wife at the bedside.  Surgeon: Wonda Olds   Assistants: Evonnie Pat PA-C  Anesthesia: General endotracheal anesthesia  ASA Class: 5    Procedure Detail  PERICARDIAL WINDOW WITH THORACOTOMY APPROACH, TRANSESOPHAGEAL ECHOCARDIOGRAM (TEE) After informed consent, he is taken to the OR and placed in the supine position on the OR table. Anesthesia is confirmed to be adequate. The anterior chest is cleansed and draped sterilely with betadine solution. A preoperative pause is performed. An incision is made in the mid-clavicular line in the left 5th ICS and carried down through the soft tissue with electrocautery. The left chest is entered safely. The left lateral border of the pericardium is densely studded with tumor. A sample nodule is resected and sent as a specimen. The pericardium is finally demonstrated and incised with return of bloody effusion which is collected and sent for pathology. A 5 cm diameter pericardial window is created by resection; the sample is sent for pathologic examination. The wound is carefully reviewed for hemostasis. A 19Fr Bard chest tube is inserted in the pericardium through a separate stab incision inferiorly placed. The wound is injected widely with liposomal  bupivicaine. The wound was closed in layers; all sponge, instrument, and needle counts were correct. I was present for all aspects of the procedure.    Estimated Blood Loss:  less than 100 mL         Drains: 19 Fr Bard          Blood Given: none          Specimens: as above        Complications:  * No complications entered in OR log *         Disposition: ICU - intubated and critically ill.         Condition: stable

## 2019-08-19 NOTE — Progress Notes (Addendum)
Patient deteriorated during echo. Pressures soft, became obtunded, increasing pressors Intubated without issue, line placed Echo with large pericardial effusion with complex appearing fluid. I see clear collapse of RA/RV during diastole Discussed with Dr Orvan Seen, TCTS who will work on draining, appreciate help Wife updated at bedside

## 2019-08-19 NOTE — Procedures (Signed)
Intubation Procedure Note [name] [id number] [birth year]  Procedure: Intubation Indications: Respiratory insufficiency  Procedure Details Consent: Unable to obtain consent because of emergent medical necessity. Time Out: Verified patient identification, verified procedure, site/side was marked, verified correct patient position, special equipment/implants available, medications/allergies/relevent history reviewed, required imaging and test results available.  Performed  Maximum sterile technique was used including antiseptics, cap, gloves, hand hygiene and mask.  MAC and 4    Evaluation Hemodynamic Status: Transient hypotension treated with pressors; O2 sats: stable throughout Patient's Current Condition: unstable Complications: No apparent complications Patient did tolerate procedure well. Chest X-ray ordered to verify placement.  CXR: tube position acceptable.   [name] 08/12/2019  Erick Colace ACNP-BC [place] Pager # [phone] OR # [phone] if no answer

## 2019-08-19 NOTE — Consult Note (Addendum)
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 the right. Note that the left hilar and mediastinal regions are obscured by the opacification. No bone lesions. IMPRESSION: Complete opacification of the left hemithorax with shift of heart and mediastinum toward the right. Suspect large pleural effusion on the left. There may well be underlying mass and/or consolidation on the left. Right lung clear. Grossly stable cardiac silhouette. Electronically Signed   By: Lowella Grip III M.D.   On: 08/13/2019 09:53   DG Chest Port 1 View  Result Date: 08/08/2019 CLINICAL DATA:  Post LEFT thoracentesis, removal 1.2 L of fluid, metastatic lung cancer EXAM: PORTABLE CHEST 1 VIEW COMPARISON:  Portable exam at 1105 hrs compared to 08/07/2019 FINDINGS: Persistent complete opacification of the LEFT hemithorax despite pain at removal of 1.2 L of fluid. Slight mediastinal shift to the RIGHT. RIGHT lung clear. No pneumothorax. Osseous structures unremarkable. IMPRESSION: No pneumothorax following LEFT thoracentesis. Persistent diffuse opacification of the LEFT hemithorax despite thoracentesis, representing  a combination of tumor, atelectatic lung and residual effusion. Electronically Signed   By: Lavonia Dana M.D.   On: 08/08/2019 11:46   DG Chest Port 1 View  Result Date: 08/07/2019 CLINICAL DATA:  The patient states increasing SOB for a few days. Patient has lung cancer and had a thoracentesis on 07/24/2019. Patient positive for Covid-19 on 07/24/2019. Hx of HTN and current smoker. EXAM: PORTABLE CHEST 1 VIEW COMPARISON:  Chest radiograph 07/23/2018 FINDINGS: Visualized portions of  the cardiomediastinal contours appear stable. Redemonstrated diffuse opacification of the left hemithorax with rightward mediastinal shift. The right lung appears clear. No right pleural effusion. No large pneumothorax. No acute finding in the visualized skeleton. IMPRESSION: Redemonstrated diffuse opacification of the left hemithorax with associated rightward mediastinal shift. Electronically Signed   By: Audie Pinto M.D.   On: 08/07/2019 20:31   DG Chest Port 1 View  Result Date: 07/24/2019 CLINICAL DATA:  Post thoracentesis on the left today. Metastatic lung cancer. COVID-19 infection. EXAM: PORTABLE CHEST 1 VIEW COMPARISON:  Radiographs 07/02/2019, CT 07/12/2019 and PET-CT 07/22/2019. FINDINGS: 1344 hours. Two views obtained. There is near complete whiteout of the left hemithorax, similar to the most recent PET-CT done 2 days ago. This corresponds with a residual large pleural effusion and subtotal collapse of the left lung. No evidence of pneumothorax. There is residual mild mediastinal shift to the right. Underlying right lung nodularity is noted. IMPRESSION: No evidence of pneumothorax following left thoracentesis. Persistent near complete whiteout of the left hemithorax consistent with a large pleural effusion and subtotal left lung collapse as seen on recent PET-CT. Electronically Signed   By: Richardean Sale M.D.   On: 07/24/2019 13:58   DG Chest Port 1V same Day  Result Date: 08/12/2019 CLINICAL DATA:  Post  left-sided thoracentesis. EXAM: PORTABLE CHEST 1 VIEW COMPARISON:  Earlier same day; 08/08/2019; 08/07/2019; chest CT-07/12/2019 FINDINGS: Persistent complete opacification of the left hemithorax following ultrasound-guided thoracentesis. There is persistent deviation of the mediastinal structures to the right. Pulmonary venous congestion within the aerated right lung without new focal airspace opacity. No pneumothorax. No acute osseous abnormalities. IMPRESSION: Persistent complete opacification of the left hemithorax following thoracentesis. No pneumothorax. Electronically Signed   By: Sandi Mariscal M.D.   On: 08/02/2019 11:29   DG Fluoro Guide CV Line-No Report  Result Date: 08/07/2019 Fluoroscopy was utilized by the requesting physician.  No radiographic interpretation.   ECHOCARDIOGRAM COMPLETE  Result Date: 08/01/2019    ECHOCARDIOGRAM REPORT   Patient Name:   KASPER MUDRICK Date of Exam: 08/21/2019 Medical Rec #:  [id number]          Height:       73.0 in Accession #:    [id number]         Weight:       226.0 lb Date of Birth:  21-Dec-1955           BSA:          2.266 m Patient Age:    37 years           BP:           86/43 mmHg Patient Gender: M                  HR:           110 bpm. Exam Location:  Inpatient Procedure: 2D Echo, Cardiac Doppler and Color Doppler                      STAT ECHO Reported to: Dr Fransico Him on 07/30/2019 1:48:00 PM                      Tamponade. Indications:     I42.9 Cardiomyopathy (unspecified)  History:         Patient has no prior history of Echocardiogram examinations.  Risk Factors:Hypertension and Dyslipidemia. Lung Cancer.  Sonographer:     Jonelle Sidle Dance Referring Phys:  [id number] Candee Furbish Diagnosing Phys: Fransico Him MD IMPRESSIONS  1. Left ventricular ejection fraction, by estimation, is 60 to 65%. The left ventricle has normal function. The left ventricle has no regional wall motion abnormalities. Left ventricular diastolic parameters are  consistent with Grade I diastolic dysfunction (impaired relaxation). Elevated left ventricular end-diastolic pressure.  2. The right ventricular size is small. Tricuspid regurgitation signal is inadequate for assessing PA pressure.  3. The mitral valve is normal in structure and function. No evidence of mitral valve regurgitation. No evidence of mitral stenosis.  4. The aortic valve is tricuspid. Aortic valve regurgitation is not visualized. Mild to moderate aortic valve sclerosis/calcification is present, without any evidence of aortic stenosis.  5. The inferior vena cava is dilated in size with <50% respiratory variability, suggesting right atrial pressure of 15 mmHg.  6. There is a moderate sized pericardial effusion that is circumferential but greatest at the apex and RV free wall with greatest diamter 1.7cm at the apex. There is diastolic RA inversion. The RV cavity is small and appears underfilled. Suggestive of RV diastolic collapse but images are not ideal. The IVC is dilated and plethoric. There is some MV inflow velocity variation with respirations. Findings worrisome for tamponade. A moderately sized pericardial effusion is present. The pericardial effusion  is circumferential. There is diastolic collapse of the right atrial wall and excessive respiratory variation in the tricuspid valve spectral Doppler velocities.  7. Patient deteriorated during 2D echo and taken to OR for emergent pericardial window by CVTS.  8.  9. 10. 11. 12. 13. 14. 15. 16. 17. 18. 19. 20. 21. FINDINGS  Left Ventricle: Left ventricular ejection fraction, by estimation, is 60 to 65%. The left ventricle has normal function. The left ventricle has no regional wall motion abnormalities. The left ventricular internal cavity size was normal in size. There is  no left ventricular hypertrophy. Left ventricular diastolic parameters are consistent with Grade I diastolic dysfunction (impaired relaxation). Elevated left ventricular  end-diastolic pressure. Right Ventricle: The right ventricular size is small. No increase in right ventricular wall thickness. Right ventricular systolic function is normal. Tricuspid regurgitation signal is inadequate for assessing PA pressure. Left Atrium: Left atrial size was normal in size. Right Atrium: Right atrial size was normal in size. Pericardium: There is a moderate sized pericardial effusion that is circumferential but greatest at the apex and RV free wall with greatest diamter 1.7cm at the apex. There is diastolic RA inversion. The RV cavity is small and appears underfilled. Suggestive of RV diastolic collapse but images are not ideal. The IVC is dilated and plethoric. There is some MV inflow velocity variation with respirations. Findings worrisome for tamponade. A moderately sized pericardial effusion is present. The pericardial effusion is circumferential. There is diastolic collapse of the right atrial wall and excessive respiratory variation in the tricuspid valve spectral Doppler velocities. Mitral Valve: The mitral valve is normal in structure and function. There is mild calcification of the anterior mitral valve leaflet(s). Normal mobility of the mitral valve leaflets. No evidence of mitral valve regurgitation. No evidence of mitral valve stenosis. Tricuspid Valve: The tricuspid valve is normal in structure. Tricuspid valve regurgitation is not demonstrated. No evidence of tricuspid stenosis. Aortic Valve: The aortic valve is tricuspid. Aortic valve regurgitation is not visualized. Mild to moderate aortic valve sclerosis/calcification is present, without any evidence of aortic stenosis. Pulmonic Valve: The  pulmonic valve was normal in structure. Pulmonic valve regurgitation is not visualized. No evidence of pulmonic stenosis. Aorta: The aortic root is normal in size and structure. Venous: The inferior vena cava is dilated in size with less than 50% respiratory variability, suggesting right  atrial pressure of 15 mmHg. IAS/Shunts: No atrial level shunt detected by color flow Doppler.  LEFT VENTRICLE PLAX 2D LVIDd:         3.40 cm  Diastology LVIDs:         2.60 cm  LV e' lateral:   8.27 cm/s LV PW:         0.80 cm  LV E/e' lateral: 6.8 LV IVS:        0.90 cm  LV e' medial:    7.72 cm/s LVOT diam:     2.10 cm  LV E/e' medial:  7.3 LV SV:         77.24 ml LV SV Index:   34.08 LVOT Area:     3.46 cm  IVC IVC diam: 2.20 cm LEFT ATRIUM             Index LA diam:        2.90 cm 1.28 cm/m LA Vol (A2C):   52.4 ml 23.12 ml/m LA Vol (A4C):   38.9 ml 17.17 ml/m LA Biplane Vol: 48.7 ml 21.49 ml/m  AORTIC VALVE LVOT Vmax:   130.00 cm/s LVOT Vmean:  86.200 cm/s LVOT VTI:    0.223 m  AORTA Ao Root diam: 3.20 cm Ao Asc diam:  3.10 cm MITRAL VALVE MV Area (PHT): 3.27 cm    SHUNTS MV Decel Time: 232 msec    Systemic VTI:  0.22 m MV E velocity: 56.30 cm/s  Systemic Diam: 2.10 cm MV A velocity: 69.80 cm/s MV E/A ratio:  0.81 Fransico Him MD Electronically signed by Fransico Him MD Signature Date/Time: 08/07/2019/2:49:56 PM    Final (Updated)    VAS [place] UPPER EXTREMITY VENOUS DUPLEX  Result Date: 08/13/2019 UPPER VENOUS STUDY  Indications: Swelling Risk Factors: None identified. Comparison Study: No prior studies. Performing Technologist: Oliver Hum RVT  Examination Guidelines: A complete evaluation includes B-mode imaging, spectral Doppler, color Doppler, and power Doppler as needed of all accessible portions of each vessel. Bilateral testing is considered an integral part of a complete examination. Limited examinations for reoccurring indications may be performed as noted.  Right Findings: +----------+------------+---------+-----------+----------+-------+ RIGHT     CompressiblePhasicitySpontaneousPropertiesSummary +----------+------------+---------+-----------+----------+-------+ Subclavian    Full       Yes       Yes                       +----------+------------+---------+-----------+----------+-------+  Left Findings: +----------+------------+---------+-----------+----------+-------+ LEFT      CompressiblePhasicitySpontaneousPropertiesSummary +----------+------------+---------+-----------+----------+-------+ IJV           Full       Yes       Yes                      +----------+------------+---------+-----------+----------+-------+ Subclavian  Partial      No        No                Acute  +----------+------------+---------+-----------+----------+-------+ Axillary      None       No        No                Acute  +----------+------------+---------+-----------+----------+-------+ Brachial  None       No        No                Acute  +----------+------------+---------+-----------+----------+-------+ Radial        Full                                          +----------+------------+---------+-----------+----------+-------+ Ulnar         Full                                          +----------+------------+---------+-----------+----------+-------+ Cephalic      Full                                          +----------+------------+---------+-----------+----------+-------+ Basilic       Full                                          +----------+------------+---------+-----------+----------+-------+  Summary:  Right: No evidence of thrombosis in the subclavian.  Left: No evidence of superficial vein thrombosis in the upper extremity. 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  *See table(s) above for measurements and observations.    Preliminary    US THORACENTESIS ASP PLEURAL SPACE W/IMG GUIDE  Result Date: 07/31/2019 INDICATION: Lung Ca Recurrent pleural effusion SOB EXAM: ULTRASOUND GUIDED Left THORACENTESIS MEDICATIONS: 10 cc 1% lidocaine. COMPLICATIONS: None immediate. PROCEDURE: An  ultrasound guided thoracentesis was thoroughly discussed with the patient and questions answered. The benefits, risks, alternatives and complications were also discussed. The patient understands and wishes to proceed with the procedure. Written consent was obtained. Ultrasound was performed to localize and mark an adequate pocket of fluid in the left chest. The area was then prepped and draped in the normal sterile fashion. 1% Lidocaine was used for local anesthesia. Under ultrasound guidance a 19 g Yueh catheter was introduced. Thoracentesis was performed. The catheter was removed and a dressing applied. FINDINGS: A total of approximately 1.5 liters of bloody fluid was removed. Samples were sent to the laboratory as requested by the clinical team. IMPRESSION: Successful ultrasound guided left thoracentesis yielding 1.5 liters of pleural fluid. Read by Lavonia Drafts [facility] Electronically Signed   By: Sandi Mariscal M.D.   On: 08/20/2019 11:17   US THORACENTESIS ASP PLEURAL SPACE W/IMG GUIDE  Result Date: 08/08/2019 INDICATION: Right pleural effusion Recurrent; malignant EXAM: ULTRASOUND GUIDED RIGHT THORACENTESIS MEDICATIONS: 10 cc 1% lidocaine COMPLICATIONS: None immediate. PROCEDURE: An ultrasound guided thoracentesis was thoroughly discussed with the patient and questions answered. The benefits, risks, alternatives and complications were also discussed. The patient understands and wishes to proceed with the procedure. Written consent was obtained. Ultrasound was performed to localize and mark an adequate pocket of fluid in the right chest. The area was then prepped and draped in the normal sterile fashion. 1% Lidocaine was used for local anesthesia. Under ultrasound guidance a 19 g Yueh catheter was introduced. Thoracentesis was performed. The catheter was removed and a dressing applied. FINDINGS:  A total of approximately 1.2 liters of blood tinged fluid was removed. IMPRESSION: Successful ultrasound guided right  thoracentesis yielding 1.2 Liters of pleural fluid. Read by Lavonia Drafts [facility] Electronically Signed   By: Lavonia Dana M.D.   On: 08/08/2019 11:09   US THORACENTESIS ASP PLEURAL SPACE W/IMG GUIDE  Result Date: 07/24/2019 INDICATION: Patient with history of metastatic disease, likely lung carcinoma, dyspnea, COVID-19 positive, left pleural effusion. Request received for diagnostic and therapeutic left thoracentesis. EXAM: ULTRASOUND GUIDED DIAGNOSTIC AND THERAPEUTIC LEFT THORACENTESIS MEDICATIONS: None COMPLICATIONS: None immediate. PROCEDURE: An ultrasound guided thoracentesis was thoroughly discussed with the patient and questions answered. The benefits, risks, alternatives and complications were also discussed. The patient understands and wishes to proceed with the procedure. Written consent was obtained. Ultrasound was performed to localize and mark an adequate pocket of fluid in the left chest. The area was then prepped and draped in the normal sterile fashion. 1% Lidocaine was used for local anesthesia. Under ultrasound guidance a 6 Fr Safe-T-Centesis catheter was introduced. Thoracentesis was performed. The catheter was removed and a dressing applied. FINDINGS: A total of approximately 1.3 liters of blood-tinged fluid was removed. Samples were sent to the laboratory as requested by the clinical team. Due to this being patient's initial thoracentesis only the above amount of fluid was removed today. IMPRESSION: Successful ultrasound guided diagnostic and therapeutic left thoracentesis yielding 1.3 liters of pleural fluid. Read by: Rowe Robert, PA-C Electronically Signed   By: Corrie Mckusick D.O.   On: 07/24/2019 13:45     US RENAL  Result Date: 08/17/2019 CLINICAL DATA:  Acute kidney injury.  Inpatient. EXAM: RENAL / URINARY TRACT ULTRASOUND COMPLETE COMPARISON:  07/22/2019 PET-CT. FINDINGS: Right Kidney: Renal measurements: 11.7 x 6.1 x 6.7 cm = volume: 249 mL. Echogenic right renal parenchyma,  normal thickness. No hydronephrosis. No renal mass. Left Kidney: Renal measurements: 11.8 x 7.4 x 8.5 cm = volume: 388 mL. Echogenic left renal parenchyma, normal thickness. No hydronephrosis. No renal mass. Bladder: Nonspecific mild diffuse bladder wall thickening. No focal bladder abnormality demonstrated. Other: None. IMPRESSION: 1. No hydronephrosis. 2. Echogenic normal size kidneys, compatible with reported history of nonspecific acute renal parenchymal disease. 3. Nonspecific mild diffuse bladder wall thickening. Suggest correlation with urinalysis. Electronically Signed   By: Ilona Sorrel M.D.   On: 08/17/2019 08:42   NM PET Image Initial (PI) Skull Base To Thigh  Result Date: 07/22/2019 CLINICAL DATA:  Initial treatment strategy for metastatic left lung cancer. EXAM: NUCLEAR MEDICINE PET SKULL BASE TO THIGH TECHNIQUE: 12.3 mCi F-18 FDG was injected intravenously. Full-ring PET imaging was performed from the skull base to thigh after the radiotracer. CT data was obtained and used for attenuation correction and anatomic localization. Fasting blood glucose: 104 mg/dl COMPARISON:  CT chest dated 07/12/2019 FINDINGS: Mediastinal blood pool activity: SUV max 2.7 Liver activity: SUV max NA NECK: 9 mm short axis left lower cervical node (series 4/image 38), max SUV 5.5. Paretic left vocal cord. Associated mild hypermetabolism of the posterior right vocal fold (PET image 48), max SUV 7.4, favored to be physiologic. If clinically warranted given the additional findings, direct inspection could be considered. Incidental CT findings: none CHEST: Left upper lobe mass, compatible primary bronchogenic neoplasm and better evaluated on recent CT chest, max SUV 22.5. Direct invasion of the mediastinum. Associated moderate left pleural effusion with multifocal pleural-based metastases, including a dominant 2.0 x 5.8 cm lesion inferiorly (series 4/image 119), max SUV 14.6. Left lower lobe compressive atelectasis.  Widespread  thoracic metastases, including left supraclavicular, left subpectoral/axillary, left superior mediastinal/prevascular/IMA, right paratracheal, subcarinal, retrocrural, and right hilar nodal metastases. Index 2.5 cm short axis node at the left thoracic inlet, max SUV 27.1. Scattered pulmonary nodules in the right lung, including a dominant 12 mm right upper lobe nodule (series 8/image 21), max SUV 5.8, compatible with pulmonary metastases. Incidental CT findings: Atherosclerotic calcifications of the aortic arch. Coronary atherosclerosis of the LAD. ABDOMEN/PELVIS: No abnormal hypermetabolism in the liver, spleen, pancreas, or adrenal glands. Retroperitoneal/para-aortic lymphadenopathy in the upper abdomen, including a 1.9 cm short axis left anterior para-aortic node (series 4/image 127), max SUV 2.2. Incidental CT findings: Atherosclerotic calcifications the abdominal aorta and branch vessels. Suspected postsurgical changes related to prior TURP. SKELETON: 1.9 x 4.0 cm metastasis involving the left anterior 3rd rib (series 4/image 67), max SUV 25.6. Incidental CT findings: Degenerative changes of the lumbar spine. IMPRESSION: Left upper lobe mass, corresponding to primary bronchogenic neoplasm. Moderate left pleural effusion with pleural-based metastases, as above. Contralateral pulmonary metastases on the right. Widespread thoracic nodal metastases, as above. Associated mild upper abdominal/retroperitoneal nodal metastases. Mild left lower cervical nodal metastasis. Osseous metastasis involving the left anterior 3rd rib. 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 If clinically warranted given the additional findings, direct inspection could be considered. Electronically Signed   By: Julian Hy M.D.   On: 07/22/2019 14:44   CT Biopsy  Result Date: 07/24/2019 INDICATION: 65 year old male with a history metastatic  disease, likely lung carcinoma. He has been referred for biopsy of left anterior third rib lesion EXAM: CT BIOPSY MEDICATIONS: None. ANESTHESIA/SEDATION: Moderate (conscious) sedation was employed during this procedure. A total of Versed 2.0 mg and Fentanyl 100 mcg was administered intravenously. Moderate Sedation Time: 15 minutes. The patient's level of consciousness and vital signs were monitored continuously by radiology nursing throughout the procedure under my direct supervision. FLUOROSCOPY TIME:  CT COMPLICATIONS: None PROCEDURE: The procedure, risks, benefits, and alternatives were explained to the patient and the patient's family. Specific risks that were addressed included bleeding, infection, pneumothorax, need for further procedure including chest tube placement, chance of delayed pneumothorax or hemorrhage, hemoptysis, nondiagnostic sample, cardiopulmonary collapse, death. Questions regarding the procedure were encouraged and answered. The patient understands and consents to the procedure. Patient was positioned in the supine position on the CT gantry table and a scout CT of the chest was performed for planning purposes. Once angle of approach was determined, the skin and subcutaneous tissues this scan was prepped and draped in the usual sterile fashion, and a sterile drape was applied covering the operative field. A sterile gown and sterile gloves were used for the procedure. Local anesthesia was provided with 1% Lidocaine. The skin and subcutaneous tissues were infiltrated 1% lidocaine for local anesthesia, and a small stab incision was made with an 11 blade scalpel. Using CT guidance, a 17 gauge trocar needle was advanced into the left anterior third ribtarget. After confirmation of the tip, separate 18 gauge core biopsies were performed. These were placed into solution for transportation to the lab. Final CT was performed. Patient tolerated the procedure well and remained hemodynamically stable  throughout. No complications were encountered and no significant blood loss was encounter IMPRESSION: Status post CT-guided biopsy of left anterior third rib lesion. Tissue specimen sent to pathology for complete histopathologic analysis. Signed, Dulcy Fanny. Dellia Nims, [facility] Radiology Electronically Signed  By: Corrie Mckusick D.O.   On: 07/24/2019 11:20   DG Chest Port 1 View  Result Date: 08/18/2019 CLINICAL DATA:  Follow-up pleural effusion EXAM: PORTABLE CHEST 1 VIEW COMPARISON:  Chest radiograph from one day prior. FINDINGS: Left apical chest tube in place. Stable cardiomediastinal silhouette with normal heart size. No right pneumothorax. No significant right pleural effusion. Volume loss in the left hemithorax is similar. Left hydropneumothorax is overall similar in size with decreased pleural component and increased pneumothorax component. Upper left parahilar masslike opacity is newly silhouetted. Stable apical right lung nodule. IMPRESSION: 1. Stable apical left chest tube position. Left hydropneumothorax is similar in size with decreased pleural component and increased pneumothorax component. Overall volume loss in the left hemithorax is similar. 2. Newly silhouetted left upper parahilar masslike opacity, compatible with known neoplasm. 3. Stable apical right lung nodule. Electronically Signed   By: Ilona Sorrel M.D.   On: 08/18/2019 06:58   DG Chest Port 1 View  Result Date: 08/17/2019 CLINICAL DATA:  Lung cancer, pleural effusion EXAM: PORTABLE CHEST 1 VIEW COMPARISON:  Chest radiograph from one day prior. FINDINGS: Complete opacification of the left hemithorax is unchanged. Stable left apical chest tube position. No pneumothorax. No right pleural effusion. Vague focal nodular opacity in the upper right lung is stable. Stable obscured cardiomediastinal silhouette. IMPRESSION: 1. Stable chest radiograph with complete opacification of the  left hemithorax and left apical chest tube in place. No pneumothorax. 2. Stable focal nodular opacity in the upper right lung. Electronically Signed   By: Ilona Sorrel M.D.   On: 08/17/2019 05:25   DG Chest Port 1 View  Result Date: 08/14/2019 CLINICAL DATA:  Follow-up chest tube. EXAM: PORTABLE CHEST 1 VIEW COMPARISON:  08/23/2019 FINDINGS: Interval placement of left chest tube. Complete opacification of the left hemithorax is unchanged. This reflects large left pleural effusion, the large left upper lobe lung mass and postobstructive changes. Similar appearance of right upper lobe lung nodule. IMPRESSION: 1. Interval placement of left chest tube. No change in complete opacification of the left hemithorax. 2. Stable right upper lobe lung nodule. Electronically Signed   By: Kerby Moors M.D.   On: 08/20/2019 12:35   DG Chest Port 1 View  Result Date: 08/20/2019 CLINICAL DATA:  Shortness of breath.  Lung carcinoma EXAM: PORTABLE CHEST 1 VIEW COMPARISON:  August 08, 2019 FINDINGS: There remains essentially complete opacification of the left hemithorax with shift of heart and mediastinum toward the right. Right lung remains clear. Heart size is grossly normal. No adenopathy evident on the right. Note that the left hilar and mediastinal regions are obscured by the opacification. No bone lesions. IMPRESSION: Complete opacification of the left hemithorax with shift of heart and mediastinum toward the right. Suspect large pleural effusion on the left. There may well be underlying mass and/or consolidation on the left. Right lung clear. Grossly stable cardiac silhouette. Electronically Signed   By: Lowella Grip III M.D.   On: 08/11/2019 09:53   DG Chest Port 1 View  Result Date: 08/08/2019 CLINICAL DATA:  Post LEFT thoracentesis, removal 1.2 L of fluid, metastatic lung cancer EXAM: PORTABLE CHEST 1 VIEW COMPARISON:  Portable exam at 1105 hrs compared to 08/07/2019 FINDINGS: Persistent complete  opacification of the LEFT hemithorax despite pain at removal of 1.2 L of fluid. Slight mediastinal shift to the RIGHT. RIGHT lung clear. No pneumothorax. Osseous structures unremarkable. IMPRESSION: No pneumothorax following LEFT thoracentesis. Persistent diffuse opacification of the LEFT hemithorax despite thoracentesis, representing  a combination of tumor, atelectatic lung and residual effusion. Electronically Signed   By: Lavonia Dana M.D.   On: 08/08/2019 11:46   DG Chest Port 1 View  Result Date: 08/07/2019 CLINICAL DATA:  The patient states increasing SOB for a few days. Patient has lung cancer and had a thoracentesis on 07/24/2019. Patient positive for Covid-19 on 07/24/2019. Hx of HTN and current smoker. EXAM: PORTABLE CHEST 1 VIEW COMPARISON:  Chest radiograph 07/23/2018 FINDINGS: Visualized portions of the cardiomediastinal contours appear stable. Redemonstrated diffuse opacification of the left hemithorax with rightward mediastinal shift. The right lung appears clear. No right pleural effusion. No large pneumothorax. No acute finding in the visualized skeleton. IMPRESSION: Redemonstrated diffuse opacification of the left hemithorax with associated rightward mediastinal shift. Electronically Signed   By: Audie Pinto M.D.   On: 08/07/2019 20:31   DG Chest Port 1 View  Result Date: 07/24/2019 CLINICAL DATA:  Post thoracentesis on the left today. Metastatic lung cancer. COVID-19 infection. EXAM: PORTABLE CHEST 1 VIEW COMPARISON:  Radiographs 07/02/2019, CT 07/12/2019 and PET-CT 07/22/2019. FINDINGS: 1344 hours. Two views obtained. There is near complete whiteout of the left hemithorax, similar to the most recent PET-CT done 2 days ago. This corresponds with a residual large pleural effusion and subtotal collapse of the left lung. No evidence of pneumothorax. There is residual mild mediastinal shift to the right. Underlying right lung nodularity is noted. IMPRESSION: No evidence of pneumothorax  following left thoracentesis. Persistent near complete whiteout of the left hemithorax consistent with a large pleural effusion and subtotal left lung collapse as seen on recent PET-CT. Electronically Signed   By: Richardean Sale M.D.   On: 07/24/2019 13:58   DG Chest Port 1V same Day  Result Date: 08/25/2019 CLINICAL DATA:  Post left-sided thoracentesis. EXAM: PORTABLE CHEST 1 VIEW COMPARISON:  Earlier same day; 08/08/2019; 08/07/2019; chest CT-07/12/2019 FINDINGS: Persistent complete opacification of the left hemithorax following ultrasound-guided thoracentesis. There is persistent deviation of the mediastinal structures to the right. Pulmonary venous congestion within the aerated right lung without new focal airspace opacity. No pneumothorax. No acute osseous abnormalities. IMPRESSION: Persistent complete opacification of the left hemithorax following thoracentesis. No pneumothorax. Electronically Signed   By: Sandi Mariscal M.D.   On: 08/14/2019 11:29   DG Fluoro Guide CV Line-No Report  Result Date: 08/05/2019 Fluoroscopy was utilized by the requesting physician.  No radiographic interpretation.   ECHOCARDIOGRAM COMPLETE  Result Date: 08/09/2019    ECHOCARDIOGRAM REPORT   Patient Name:   COHEN BOETTNER Date of Exam: 08/14/2019 Medical Rec #:  [id number]          Height:       73.0 in Accession #:    [id number]         Weight:       226.0 lb Date of Birth:  [birth year]           BSA:          2.266 m Patient Age:    74 years           BP:           86/43 mmHg Patient Gender: M                  HR:           110 bpm. Exam Location:  Inpatient Procedure: 2D Echo, Cardiac Doppler and Color Doppler  STAT ECHO Reported to: Dr Fransico Him on 08/14/2019 1:48:00 PM                      Tamponade. Indications:     I42.9 Cardiomyopathy (unspecified)  History:         Patient has no prior history of Echocardiogram examinations.                  Risk Factors:Hypertension and Dyslipidemia. Lung  Cancer.  Sonographer:     Jonelle Sidle Dance Referring Phys:  [id number] Candee Furbish Diagnosing Phys: Fransico Him MD IMPRESSIONS  1. Left ventricular ejection fraction, by estimation, is 60 to 65%. The left ventricle has normal function. The left ventricle has no regional wall motion abnormalities. Left ventricular diastolic parameters are consistent with Grade I diastolic dysfunction (impaired relaxation). Elevated left ventricular end-diastolic pressure.  2. The right ventricular size is small. Tricuspid regurgitation signal is inadequate for assessing PA pressure.  3. The mitral valve is normal in structure and function. No evidence of mitral valve regurgitation. No evidence of mitral stenosis.  4. The aortic valve is tricuspid. Aortic valve regurgitation is not visualized. Mild to moderate aortic valve sclerosis/calcification is present, without any evidence of aortic stenosis.  5. The inferior vena cava is dilated in size with <50% respiratory variability, suggesting right atrial pressure of 15 mmHg.  6. There is a moderate sized pericardial effusion that is circumferential but greatest at the apex and RV free wall with greatest diamter 1.7cm at the apex. There is diastolic RA inversion. The RV cavity is small and appears underfilled. Suggestive of RV diastolic collapse but images are not ideal. The IVC is dilated and plethoric. There is some MV inflow velocity variation with respirations. Findings worrisome for tamponade. A moderately sized pericardial effusion is present. The pericardial effusion  is circumferential. There is diastolic collapse of the right atrial wall and excessive respiratory variation in the tricuspid valve spectral Doppler velocities.  7. Patient deteriorated during 2D echo and taken to OR for emergent pericardial window by CVTS.  8.  9. 10. 11. 12. 13. 14. 15. 16. 17. 18. 19. 20. 21. FINDINGS  Left Ventricle: Left ventricular ejection fraction, by estimation, is 60 to 65%. The left ventricle  has normal function. The left ventricle has no regional wall motion abnormalities. The left ventricular internal cavity size was normal in size. There is  no left ventricular hypertrophy. Left ventricular diastolic parameters are consistent with Grade I diastolic dysfunction (impaired relaxation). Elevated left ventricular end-diastolic pressure. Right Ventricle: The right ventricular size is small. No increase in right ventricular wall thickness. Right ventricular systolic function is normal. Tricuspid regurgitation signal is inadequate for assessing PA pressure. Left Atrium: Left atrial size was normal in size. Right Atrium: Right atrial size was normal in size. Pericardium: There is a moderate sized pericardial effusion that is circumferential but greatest at the apex and RV free wall with greatest diamter 1.7cm at the apex. There is diastolic RA inversion. The RV cavity is small and appears underfilled. Suggestive of RV diastolic collapse but images are not ideal. The IVC is dilated and plethoric. There is some MV inflow velocity variation with respirations. Findings worrisome for tamponade. A moderately sized pericardial effusion is present. The pericardial effusion is circumferential. There is diastolic collapse of the right atrial wall and excessive respiratory variation in the tricuspid valve spectral Doppler velocities. Mitral Valve: The mitral valve is normal in structure and function. There is mild  calcification of the anterior mitral valve leaflet(s). Normal mobility of the mitral valve leaflets. No evidence of mitral valve regurgitation. No evidence of mitral valve stenosis. Tricuspid Valve: The tricuspid valve is normal in structure. Tricuspid valve regurgitation is not demonstrated. No evidence of tricuspid stenosis. Aortic Valve: The aortic valve is tricuspid. Aortic valve regurgitation is not visualized. Mild to moderate aortic valve sclerosis/calcification is present, without any evidence of aortic  stenosis. Pulmonic Valve: The pulmonic valve was normal in structure. Pulmonic valve regurgitation is not visualized. No evidence of pulmonic stenosis. Aorta: The aortic root is normal in size and structure. Venous: The inferior vena cava is dilated in size with less than 50% respiratory variability, suggesting right atrial pressure of 15 mmHg. IAS/Shunts: No atrial level shunt detected by color flow Doppler.  LEFT VENTRICLE PLAX 2D LVIDd:         3.40 cm  Diastology LVIDs:         2.60 cm  LV e' lateral:   8.27 cm/s LV PW:         0.80 cm  LV E/e' lateral: 6.8 LV IVS:        0.90 cm  LV e' medial:    7.72 cm/s LVOT diam:     2.10 cm  LV E/e' medial:  7.3 LV SV:         77.24 ml LV SV Index:   34.08 LVOT Area:     3.46 cm  IVC IVC diam: 2.20 cm LEFT ATRIUM             Index LA diam:        2.90 cm 1.28 cm/m LA Vol (A2C):   52.4 ml 23.12 ml/m LA Vol (A4C):   38.9 ml 17.17 ml/m LA Biplane Vol: 48.7 ml 21.49 ml/m  AORTIC VALVE LVOT Vmax:   130.00 cm/s LVOT Vmean:  86.200 cm/s LVOT VTI:    0.223 m  AORTA Ao Root diam: 3.20 cm Ao Asc diam:  3.10 cm MITRAL VALVE MV Area (PHT): 3.27 cm    SHUNTS MV Decel Time: 232 msec    Systemic VTI:  0.22 m MV E velocity: 56.30 cm/s  Systemic Diam: 2.10 cm MV A velocity: 69.80 cm/s MV E/A ratio:  0.81 Fransico Him MD Electronically signed by Fransico Him MD Signature Date/Time: 08/13/2019/2:49:56 PM    Final (Updated)    VAS [place] UPPER EXTREMITY VENOUS DUPLEX  Result Date: 08/14/2019 UPPER VENOUS STUDY  Indications: Swelling Risk Factors: None identified. Comparison Study: No prior studies. Performing Technologist: Oliver Hum RVT  Examination Guidelines: A complete evaluation includes B-mode imaging, spectral Doppler, color Doppler, and power Doppler as needed of all accessible portions of each vessel. Bilateral testing is considered an integral part of a complete examination. Limited examinations for reoccurring indications may be performed as noted.  Right Findings:  +----------+------------+---------+-----------+----------+-------+ RIGHT     CompressiblePhasicitySpontaneousPropertiesSummary +----------+------------+---------+-----------+----------+-------+ Subclavian    Full       Yes       Yes                      +----------+------------+---------+-----------+----------+-------+  Left Findings: +----------+------------+---------+-----------+----------+-------+ LEFT      CompressiblePhasicitySpontaneousPropertiesSummary +----------+------------+---------+-----------+----------+-------+ IJV           Full       Yes       Yes                      +----------+------------+---------+-----------+----------+-------+ Subclavian  Partial  No        No                Acute  +----------+------------+---------+-----------+----------+-------+ Axillary      None       No        No                Acute  +----------+------------+---------+-----------+----------+-------+ Brachial      None       No        No                Acute  +----------+------------+---------+-----------+----------+-------+ Radial        Full                                          +----------+------------+---------+-----------+----------+-------+ Ulnar         Full                                          +----------+------------+---------+-----------+----------+-------+ Cephalic      Full                                          +----------+------------+---------+-----------+----------+-------+ Basilic       Full                                          +----------+------------+---------+-----------+----------+-------+  Summary:  Right: No evidence of thrombosis in the subclavian.  Left: No evidence of superficial vein thrombosis in the upper extremity. 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  *See table(s) above for measurements and observations.     Preliminary    US THORACENTESIS ASP PLEURAL SPACE W/IMG GUIDE  Result Date: 08/20/2019 INDICATION: Lung Ca Recurrent pleural effusion SOB EXAM: ULTRASOUND GUIDED Left THORACENTESIS MEDICATIONS: 10 cc 1% lidocaine. COMPLICATIONS: None immediate. PROCEDURE: An ultrasound guided thoracentesis was thoroughly discussed with the patient and questions answered. The benefits, risks, alternatives and complications were also discussed. The patient understands and wishes to proceed with the procedure. Written consent was obtained. Ultrasound was performed to localize and mark an adequate pocket of fluid in the left chest. The area was then prepped and draped in the normal sterile fashion. 1% Lidocaine was used for local anesthesia. Under ultrasound guidance a 19 g Yueh catheter was introduced. Thoracentesis was performed. The catheter was removed and a dressing applied. FINDINGS: A total of approximately 1.5 liters of bloody fluid was removed. Samples were sent to the laboratory as requested by the clinical team. IMPRESSION: Successful ultrasound guided left thoracentesis yielding 1.5 liters of pleural fluid. Read by Lavonia Drafts [facility] Electronically Signed   By: Sandi Mariscal M.D.   On: 08/17/2019 11:17   US THORACENTESIS ASP PLEURAL SPACE W/IMG GUIDE  Result Date: 08/08/2019 INDICATION: Right pleural effusion Recurrent; malignant EXAM: ULTRASOUND GUIDED RIGHT THORACENTESIS MEDICATIONS: 10 cc 1% lidocaine COMPLICATIONS: None immediate. PROCEDURE: An ultrasound guided thoracentesis was thoroughly discussed with the patient and questions answered. The benefits, risks, alternatives and complications were  also discussed. The patient understands and wishes to proceed with the procedure. Written consent was obtained. Ultrasound was performed to localize and mark an adequate pocket of fluid in the right chest. The area was then prepped and draped in the normal sterile fashion. 1% Lidocaine was used for local anesthesia.  Under ultrasound guidance a 19 g Yueh catheter was introduced. Thoracentesis was performed. The catheter was removed and a dressing applied. FINDINGS: A total of approximately 1.2 liters of blood tinged fluid was removed. IMPRESSION: Successful ultrasound guided right thoracentesis yielding 1.2 Liters of pleural fluid. Read by Lavonia Drafts [facility] Electronically Signed   By: Lavonia Dana M.D.   On: 08/08/2019 11:09   US THORACENTESIS ASP PLEURAL SPACE W/IMG GUIDE  Result Date: 07/24/2019 INDICATION: Patient with history of metastatic disease, likely lung carcinoma, dyspnea, COVID-19 positive, left pleural effusion. Request received for diagnostic and therapeutic left thoracentesis. EXAM: ULTRASOUND GUIDED DIAGNOSTIC AND THERAPEUTIC LEFT THORACENTESIS MEDICATIONS: None COMPLICATIONS: None immediate. PROCEDURE: An ultrasound guided thoracentesis was thoroughly discussed with the patient and questions answered. The benefits, risks, alternatives and complications were also discussed. The patient understands and wishes to proceed with the procedure. Written consent was obtained. Ultrasound was performed to localize and mark an adequate pocket of fluid in the left chest. The area was then prepped and draped in the normal sterile fashion. 1% Lidocaine was used for local anesthesia. Under ultrasound guidance a 6 Fr Safe-T-Centesis catheter was introduced. Thoracentesis was performed. The catheter was removed and a dressing applied. FINDINGS: A total of approximately 1.3 liters of blood-tinged fluid was removed. Samples were sent to the laboratory as requested by the clinical team. Due to this being patient's initial thoracentesis only the above amount of fluid was removed today. IMPRESSION: Successful ultrasound guided diagnostic and therapeutic left thoracentesis yielding 1.3 liters of pleural fluid. Read by: Rowe Robert, PA-C Electronically Signed   By: Corrie Mckusick D.O.   On: 07/24/2019 13:45    Pathology:   SURGICAL PATHOLOGY  CASE: [id number]  PATIENT: [facility]  Surgical Pathology Report   Clinical History: Left 3rd rib lesion, likely mets (cm)   FINAL MICROSCOPIC DIAGNOSIS:   A. BONE, LEFT THIRD RIB, BIOPSY:  - Non-small cell carcinoma.   COMMENT:   Immunohistochemistry for p40 and CK5/6 is diffusely positive. TTF-1 and  Mucicarmine are focally positive. The immunophenotype demonstrates  predominantly squamous differentiation; however, rare mucin vacuoles are  noted. The differential diagnosis can include adenosquamous carcinoma  and mucoepidermoid carcinoma, although less likely. Results reported to  Dr. Derek Jack on 07/29/2019. Intradepartmental consultation  (Dr. Vic Ripper).   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 -The patient has widespread metastatic lung cancer. -Now with left mainstem bronchus obstruction. -Radiation oncology has been consulted and planning to begin radiation in the next 1 to 2 days. -Agree with obtaining MRI of the brain with and without contrast to evaluate for brain metastases. -I am unclear if there is a role for concurrent chemotherapy in this situation. -Further recommendations per Dr. Irene Limbo.  Pericardial effusion with cardiac tamponade -New pericardial effusion noted today on echocardiogram. -The patient decompensated shortly after my visit and was intubated. -Cardiothoracic surgery planning for left anterior thoracotomy for pericardial window today.  Recurrent left pleural effusion -Pleurx catheter in place with a large volume draining. -Commend proceeding with radiation as planned by  Dr. Lisbeth Renshaw.  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 -Calcium level slowly trending downward.  Will monitor for  now.  AKI -He has had tenured worsening of his renal function this admission -Thought to be due to hypotension and related to fluid shift -Nephrology consult has been requested.  Thank you for this referral.   Mikey Bussing, DNP, AGPCNP-BC, AOCNP   ADDENDUM  .Patient was Personally and independently examined and relevant elements of the history of present illness were reviewed in details and an assessment and plan was created. He was intubated and unable to provide additional information. All elements of the patient's history of present illness , assessment and plan were discussed in details with Mikey Bussing, DNP. The above documentation reflects our combined findings assessment and plan.  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  Creatinine now 3.27 -- likely multifactorial -- hypercalcemia+ fluid shifts from recurrent pleural effusion, hypoperfusion from hypotension related to pericardial effusion with tamponade physiology. . CMP Latest Ref Rng & Units 08/09/2019 08/18/2019 08/17/2019  Glucose 70 - 99 mg/dL 134(H) 140(H) 93  BUN 8 - 23 mg/dL 82(H) 71(H) 53(H)  Creatinine 0.61 - 1.24 mg/dL 3.27(H) 2.63(H) 2.01(H)  Sodium 135 - 145 mmol/L 141 140 137  Potassium 3.5 - 5.1 mmol/L 4.7 4.7 4.6  Chloride 98 - 111 mmol/L 103 103 94(L)  CO2 22 - 32 mmol/L 24 21(L) 29  Calcium 8.9 - 10.3 mg/dL 11.9(H) 12.1(H) 13.2(HH)  Total Protein 6.5 - 8.1 g/dL - - 5.8(L)  Total Bilirubin 0.3 - 1.2 mg/dL - - 1.0  Alkaline Phos 38 - 126 U/L - - 80  AST 15 - 41 U/L - - 45(H)  ALT 0 - 44 U/L - - 44   4) recent COVID 19  infection on 07/24/2019 5) ECOG PS 3-4 currently PLAN -Patient currently critically ill intubated and in cardiogenic shock due to cardiac tamponade now status post pericardial window -Cytology from pericardial fluid has been sent to pathology to evaluate to see if this is a malignant pericardial effusion. -IV fluids as tolerated by cardiopulmonary status to continue to address malignant hypercalcemia.  Hopefully this might improve after palliative radiation to some degree as well. -Patient has been seen by radiation oncology and they have recommended palliative radiation which would be reasonable from our perspective. -Given the patient's current poor performance status, multiorgan dysfunction including AKI, recent COVID-19 infection -at this point the patient would not be a good candidate for concurrent chemotherapy or other palliative systemic therapies. -Patient's primary oncologist is Dr. Delton Coombes at [facility] who has sent out PD-L1 testing and foundation 1 testing to determine the possibility of other targeted palliative therapies.  Results of these are currently pending. -Consider palliative care consultation for ongoing goals of care discussion. -Appreciate excellent cares from the critical care, cardiothoracic surgery and radiation oncology teams. -We shall be available for any additional help as indicated. -Patient will follow up with Dr. Delton Coombes on discharge for medical oncology follow-up.   Sullivan Lone MD MS

## 2019-08-19 NOTE — Progress Notes (Signed)
Drained 1L of serosanguinous fluid off of Left PleurX drain. Pt became very restless trying to sit on the side of bed with increased work of breathing afterwards. Still on 6L [place] and O2 Sat 100% after moving patient back up in bed.    Will continue to monitor.  Dewaine Oats, RN

## 2019-08-19 NOTE — Progress Notes (Signed)
ANTICOAGULATION CONSULT NOTE - Initial Consult  Pharmacy Consult for heparin  Indication: DVT  No Known Allergies  Patient Measurements: Height: 6\' 1"  (185.4 cm) Weight: 225 lb 15.5 oz (102.5 kg) IBW/kg (Calculated) : 79.9 Heparin Dosing Weight: 100.8  Vital Signs: Temp: 97.7 F (36.5 C) (02/22 0713) Temp Source: Oral (02/22 0713) BP: 79/68 (02/22 0915) Pulse Rate: 116 (02/22 0915)  Labs: Recent Labs    07/30/2019 1153 08/11/2019 1153 08/17/19 0616 08/18/19 0857 08/18/2019 0226  HGB 13.4   < > 14.8  --  15.5  HCT 41.8  --  46.1  --  47.4  PLT 214  --  185  --  171  APTT 27  --   --   --   --   LABPROT 16.0*  --   --   --   --   INR 1.3*  --   --   --   --   CREATININE  --   --  2.01* 2.63* 3.27*   < > = values in this interval not displayed.    Estimated Creatinine Clearance: 29.8 mL/min (A) (by C-G formula based on SCr of 3.27 mg/dL (H)).   Medical History: Past Medical History:  Diagnosis Date  . ED (erectile dysfunction)   . Hyperlipidemia   . Hypertension   . Lung cancer [facility])      Assessment: Patient with stage 4 lung cancer with a pleural drainage catheter for recurrent pleural effusions. Vascular ultrasound of left upper extremity positive for new DVTs. Will initiate heparin drip.   Goal of Therapy:  Heparin level 0.3-0.7 units/ml Monitor platelets by anticoagulation protocol: Yes   Plan:  Give 5000 units bolus x 1  Start heparin drip at 1700 units/hr  Check 6 hour HL Daily HL and CBC  Marliss Czar Jaide Hillenburg 08/03/2019,9:52 AM

## 2019-08-19 NOTE — Progress Notes (Addendum)
[address]       [place] [address]             [phone]      3 Days Post-Op Procedure(s) (LRB): INSERTION PLEURAL DRAINAGE CATHETER (Left) Subjective: Appears dyspneic, just drained 1000 ml from pleurx   Objective: Vital signs in last 24 hours: Temp:  [97.6 F (36.4 C)-97.7 F (36.5 C)] 97.7 F (36.5 C) (02/22 0713) Pulse Rate:  [107-135] 116 (02/22 0915) Cardiac Rhythm: Sinus tachycardia (02/22 0800) Resp:  [13-35] 19 (02/22 0915) BP: (73-138)/(37-117) 79/68 (02/22 0915) SpO2:  [77 %-100 %] 94 % (02/22 0915) Weight:  [102.5 kg] 102.5 kg (02/22 0338)  Hemodynamic parameters for last 24 hours:    Intake/Output from previous day: 02/21 0701 - 02/22 0700 In: 4429.5 [P.O.:675; I.V.:3754.5] Out: 830 [Urine:450; Chest Tube:380] Intake/Output this shift: Total I/O In: 1254.1 [I.V.:254.1] Out: -   General appearance: alert, distracted and mild distress Heart: regular rate and rhythm and tachy Lungs: fairly clear but doesn't take deep breaths Extremities: left arm edematous pleurex dressing CDI  Lab Results: Recent Labs    08/17/19 0616 08/18/2019 0226  WBC 15.3* 20.4*  HGB 14.8 15.5  HCT 46.1 47.4  PLT 185 171   BMET:  Recent Labs    08/18/19 0857 08/14/2019 0226  NA 140 141  K 4.7 4.7  CL 103 103  CO2 21* 24  GLUCOSE 140* 134*  BUN 71* 82*  CREATININE 2.63* 3.27*  CALCIUM 12.1* 11.9*    PT/INR:  Recent Labs    08/20/2019 1153  LABPROT 16.0*  INR 1.3*   ABG No results found for: PHART, HCO3, TCO2, ACIDBASEDEF, O2SAT CBG (last 3)  Recent Labs    08/03/2019 1511  GLUCAP 101*    Meds Scheduled Meds: . ascorbic acid  500 mg Oral q morning - 10a  . aspirin EC  81 mg Oral q morning - 10a  . Chlorhexidine Gluconate Cloth  6 each Topical Daily  . feeding supplement (ENSURE ENLIVE)  237 mL Oral BID BM  . heparin injection (subcutaneous)  5,000 Units Subcutaneous Q8H  . latanoprost  1 drop Both Eyes QHS  . mouth rinse  15  mL Mouth Rinse BID  . midodrine  10 mg Oral TID WC  . multivitamin with minerals  1 tablet Oral q morning - 10a  . pravastatin  20 mg Oral QPM  . senna-docusate  1 tablet Oral BID   Continuous Infusions: . electrolyte-148    . phenylephrine (NEO-SYNEPHRINE) Adult infusion 70 mcg/min (08/07/2019 0800)   PRN Meds:.acetaminophen **OR** acetaminophen, ondansetron **OR** ondansetron (ZOFRAN) IV, traMADol  Xrays DG Chest Port 1 View  Result Date: 08/18/2019 CLINICAL DATA:  Follow-up pleural effusion EXAM: PORTABLE CHEST 1 VIEW COMPARISON:  Chest radiograph from one day prior. FINDINGS: Left apical chest tube in place. Stable cardiomediastinal silhouette with normal heart size. No right pneumothorax. No significant right pleural effusion. Volume loss in the left hemithorax is similar. Left hydropneumothorax is overall similar in size with decreased pleural component and increased pneumothorax component. Upper left parahilar masslike opacity is newly silhouetted. Stable apical right lung nodule. IMPRESSION: 1. Stable apical left chest tube position. Left hydropneumothorax is similar in size with decreased pleural component and increased pneumothorax component. Overall volume loss in the left hemithorax is similar. 2. Newly silhouetted left upper parahilar masslike opacity, compatible with known neoplasm. 3. Stable apical right lung nodule. Electronically Signed   By: Janina Mayo.D.  On: 08/18/2019 06:58    Assessment/Plan: S/P Procedure(s) (LRB): INSERTION PLEURAL DRAINAGE CATHETER (Left)  1 stage 4 lung cancer , cont daily pleurx drainage. Care per PCCM , poss oncology .  2 venous duplex is pending to r/o DVT LUE  LOS: 4 days    [name] [facility] 08/16/2019 Pager [phone]  Still c/o shortness of breath, no apparent relief after draining a liter of fluid He is confused currently, MR brain ordered + DVT in left arm  Remo Lipps C. Roxan Hockey, MD [facility] [phone]

## 2019-08-19 NOTE — Progress Notes (Signed)
NAME:  [name], MRN:  [id number], DOB:  [birth year], LOS: 4 ADMISSION DATE:  08/09/2019, CONSULTATION DATE:  2/19 REFERRING MD:  Broadus John, CHIEF COMPLAINT:  Post-op hypotension    Brief History   64 year old male patient admitted on 2/18 with recurrent left pleural effusion in the context of stage IV non-small cell lung cancer.  He was transferred to [facility] for Pleurx tube placement, 2 L of fluid was removed intraoperatively.  During the case noted to have hypotension as low with systolic blood pressure in the 70s, was given 3 doses of albumin and remained hypotensive and therefore was placed on Neo-Synephrine drip, critical care consulted for postoperative hypotension.  Patient had COVID infection on 1/27.  Out of infectious window.  History of present illness   64 year old black male admitted on 2/18 with chief complaint of shortness of breath and worsening exertional dyspnea.  Recently diagnosed with stage IV lung cancer with recurrent left pleural effusion.  He has undergone multiple thoracentesis with rapid reaccumulation of fluid.  Presented to the emergency room at [facility] on 2/18 with acute shortness of breath and exertional dyspnea.  Chest x-ray showed complete opacification of the left thorax CT chest showing large left pleural effusion with midline shift.  He underwent a large-volume thoracentesis in the emergency room where they removed 1.5 L, reported as bloody pleural fluid.  He was transferred to [facility] for thoracic surgery evaluation and placement of Pleurx catheter.  He underwent successful Pleurx placement on 2/19 by thoracic surgery , They removed 2 L of pleural fluid, intraoperatively was noted to have hypotension with systolic blood pressure as low as the 70s.  He was administered 3 doses of albumin by anesthesia, and then started on low-dose phenylephrine at 30 mics per minute.  In the recovery room he was awake, drowsy, but oriented reporting left-sided  surgical site discomfort but otherwise stable current systolic blood pressure in the 90s on 30 mics per minute of Neo-Synephrine.  Critical care was consulted for postoperative hypotension  Past Medical History  History of tobacco abuse, hyperlipidemia, recently diagnosed with metastatic non-small cell lung cancer with recurrent left malignant pleural effusion  Significant Hospital Events   2/18 evaluated in the emergency room at [facility] where he underwent a 1.5 L thoracentesis.  Fluid reported as blood-tinged, transported to [facility] for thoracic surgery evaluation 2/19: Underwent Pleurx catheter placement by thoracic surgery.  2 L of pleural fluid removed.  Intra-Op hypotension noted, administered 3 doses of albumin but remained hypotensive so started on Neo-Synephrine infusion, critical care asked to evaluate  Consults:  Thoracic surgery 2/18 Critical care 2/19 2/22: medonc, radonc, nephrology, palliative care  Procedures:  Pleurx catheter placement 2/19  Significant Diagnostic Tests:    Micro Data:  2/18 pleural fluid>>>neg 2/18 Gram stain pleural fluid>>>neg  Antimicrobials:  Ancef x1 Intra-Op  Interim history/subjective:  No events. Kidney function continues to deteriorate.  Objective   Blood pressure 92/61, pulse (!) 115, temperature 97.7 F (36.5 C), temperature source Oral, resp. rate 14, height 6\' 1"  (1.854 m), weight 102.5 kg, SpO2 98 %.        Intake/Output Summary (Last 24 hours) at 07/29/2019 0753 Last data filed at 08/18/2019 0700 Gross per 24 hour  Intake 4429.47 ml  Output 830 ml  Net 3599.47 ml   Filed Weights   08/17/19 0315 08/18/19 0500 08/09/2019 0338  Weight: 96.7 kg 100.3 kg 102.5 kg    Examination: GEN: 64 year old  man in NAD HEENT: MM dry, trachea midline CV: RRR, ext warm PULM: absent on L, pleurX site CDI, serous fluid in atrium GI: Soft, hypoactive BS EXT: No edema NEURO: moves all 4 ext to command, weak PSYCH: more  confused today SKIN: No rashes   Resolved Hospital Problem list     Assessment & Plan:  Postoperative hypotension. - Still an issue despite fluids and midodrine - Check cortisol, echo  Plan -F/u AM labs -Start midodrine, protein supplements -Every IVF we are adding is coming out chest tube, I think best course here may be to engage medonc and radonc for inpatient chemoradiation to see if we can open up left mainstem to stop vacuum physiology in left pleural space, consult placed to medonc, awaiting call back from radonc  Stage IV non-small cell lung cancer with recurrent left pleural effusion -Now status post Pleurx drain placement 2/19; entrapment physiology Plan Encourage IS Pleural drainage changed to intermittent  AKI, hypercalcemia, oliguria- hypotension and fluid shift related Plan -IVF ongoing -Renal US unremarkable -Calcitonin and zometa as ordered -Will get nephrology consult, getting volume overloaded with the IVF and worsening renal function  LUE swelling- distal pulses intact, duplex still pending...changed order to stat  COVID positive 1/27- out of infectious window  Leukocytosis- no objective evidence of infection, watch for now  Worsening organ failure in advanced cancer patient- will have palliative on board to establish relationship in case things deteriorate further  34 minutes CC time  Erskine Emery MD [facility] 08/18/2019 7:53 AM Personal pager: [phone] If unanswered, please page CCM On-call: [phone]

## 2019-08-19 NOTE — Progress Notes (Signed)
Left upper extremity venous duplex has been completed. Preliminary results can be found in CV Proc through chart review.  Results were given to the patient's nurse, Cori.  08/17/2019 9:50 AM [name] RVT

## 2019-08-19 NOTE — Progress Notes (Signed)
  Echocardiogram Echocardiogram Transesophageal has been performed.  [name] 08/06/2019, 5:06 PM

## 2019-08-20 ENCOUNTER — Ambulatory Visit: Admit: 2019-08-20 | Payer: BC Managed Care – PPO | Admitting: Radiation Oncology

## 2019-08-20 ENCOUNTER — Inpatient Hospital Stay (HOSPITAL_COMMUNITY): Payer: BC Managed Care – PPO

## 2019-08-20 ENCOUNTER — Encounter: Payer: Self-pay | Admitting: *Deleted

## 2019-08-20 ENCOUNTER — Ambulatory Visit: Payer: BC Managed Care – PPO | Admitting: Radiation Oncology

## 2019-08-20 ENCOUNTER — Encounter (HOSPITAL_COMMUNITY): Payer: Self-pay | Admitting: Hematology

## 2019-08-20 DIAGNOSIS — A419 Sepsis, unspecified organism: Secondary | ICD-10-CM | POA: Diagnosis not present

## 2019-08-20 DIAGNOSIS — N179 Acute kidney failure, unspecified: Secondary | ICD-10-CM | POA: Diagnosis not present

## 2019-08-20 DIAGNOSIS — C3490 Malignant neoplasm of unspecified part of unspecified bronchus or lung: Secondary | ICD-10-CM | POA: Diagnosis not present

## 2019-08-20 DIAGNOSIS — N170 Acute kidney failure with tubular necrosis: Secondary | ICD-10-CM | POA: Diagnosis not present

## 2019-08-20 DIAGNOSIS — C3412 Malignant neoplasm of upper lobe, left bronchus or lung: Secondary | ICD-10-CM | POA: Diagnosis not present

## 2019-08-20 DIAGNOSIS — I319 Disease of pericardium, unspecified: Secondary | ICD-10-CM | POA: Diagnosis not present

## 2019-08-20 DIAGNOSIS — L8990 Pressure ulcer of unspecified site, unspecified stage: Secondary | ICD-10-CM | POA: Insufficient documentation

## 2019-08-20 DIAGNOSIS — Z4682 Encounter for fitting and adjustment of non-vascular catheter: Secondary | ICD-10-CM | POA: Diagnosis not present

## 2019-08-20 DIAGNOSIS — J9601 Acute respiratory failure with hypoxia: Secondary | ICD-10-CM | POA: Diagnosis not present

## 2019-08-20 LAB — CBC WITH DIFFERENTIAL/PLATELET
Abs Immature Granulocytes: 0.1 10*3/uL — ABNORMAL HIGH (ref 0.00–0.07)
Basophils Absolute: 0 10*3/uL (ref 0.0–0.1)
Basophils Relative: 0 %
Eosinophils Absolute: 0 10*3/uL (ref 0.0–0.5)
Eosinophils Relative: 0 %
HCT: 44.8 % (ref 39.0–52.0)
Hemoglobin: 14.7 g/dL (ref 13.0–17.0)
Immature Granulocytes: 1 %
Lymphocytes Relative: 4 %
Lymphs Abs: 0.6 10*3/uL — ABNORMAL LOW (ref 0.7–4.0)
MCH: 30.3 pg (ref 26.0–34.0)
MCHC: 32.8 g/dL (ref 30.0–36.0)
MCV: 92.4 fL (ref 80.0–100.0)
Monocytes Absolute: 0.9 10*3/uL (ref 0.1–1.0)
Monocytes Relative: 6 %
Neutro Abs: 15.2 10*3/uL — ABNORMAL HIGH (ref 1.7–7.7)
Neutrophils Relative %: 89 %
Platelets: 141 10*3/uL — ABNORMAL LOW (ref 150–400)
RBC: 4.85 MIL/uL (ref 4.22–5.81)
RDW: 14.6 % (ref 11.5–15.5)
WBC: 16.8 10*3/uL — ABNORMAL HIGH (ref 4.0–10.5)
nRBC: 0 % (ref 0.0–0.2)

## 2019-08-20 LAB — BASIC METABOLIC PANEL
Anion gap: 12 (ref 5–15)
BUN: 88 mg/dL — ABNORMAL HIGH (ref 8–23)
CO2: 23 mmol/L (ref 22–32)
Calcium: 10.8 mg/dL — ABNORMAL HIGH (ref 8.9–10.3)
Chloride: 104 mmol/L (ref 98–111)
Creatinine, Ser: 3.31 mg/dL — ABNORMAL HIGH (ref 0.61–1.24)
GFR calc Af Amer: 22 mL/min — ABNORMAL LOW (ref >60)
GFR calc non Af Amer: 19 mL/min — ABNORMAL LOW (ref >60)
Glucose, Bld: 115 mg/dL — ABNORMAL HIGH (ref 70–99)
Potassium: 4.7 mmol/L (ref 3.5–5.1)
Sodium: 139 mmol/L (ref 135–145)

## 2019-08-20 LAB — CULTURE, BODY FLUID W GRAM STAIN -BOTTLE: Culture: NO GROWTH

## 2019-08-20 LAB — PROTIME-INR
INR: 1.4 — ABNORMAL HIGH (ref 0.8–1.2)
Prothrombin Time: 17.1 seconds — ABNORMAL HIGH (ref 11.4–15.2)

## 2019-08-20 LAB — GLUCOSE, CAPILLARY
Glucose-Capillary: 100 mg/dL — ABNORMAL HIGH (ref 70–99)
Glucose-Capillary: 102 mg/dL — ABNORMAL HIGH (ref 70–99)
Glucose-Capillary: 95 mg/dL (ref 70–99)
Glucose-Capillary: 96 mg/dL (ref 70–99)
Glucose-Capillary: 97 mg/dL (ref 70–99)

## 2019-08-20 LAB — PREPARE RBC (CROSSMATCH)

## 2019-08-20 LAB — HEPARIN LEVEL (UNFRACTIONATED)
Heparin Unfractionated: 0.93 IU/mL — ABNORMAL HIGH (ref 0.30–0.70)
Heparin Unfractionated: 0.99 IU/mL — ABNORMAL HIGH (ref 0.30–0.70)
Heparin Unfractionated: 0.74 IU/mL — ABNORMAL HIGH (ref 0.30–0.70)

## 2019-08-20 MED ORDER — COLCHICINE 0.6 MG PO TABS
0.6000 mg | ORAL_TABLET | Freq: Two times a day (BID) | ORAL | Status: DC
  Administered 2019-08-20 – 2019-08-22 (×4): 0.6 mg via ORAL
  Filled 2019-08-20 (×4): qty 1

## 2019-08-20 MED ORDER — NOREPINEPHRINE 16 MG/250ML-% IV SOLN
0.0000 ug/min | INTRAVENOUS | Status: DC
  Administered 2019-08-20: 10:00:00 9 ug/min via INTRAVENOUS
  Administered 2019-08-21: 03:00:00 26 ug/min via INTRAVENOUS
  Administered 2019-08-21: 13:00:00 20 ug/min via INTRAVENOUS
  Administered 2019-08-22: 01:00:00 33 ug/min via INTRAVENOUS
  Administered 2019-08-22: 08:00:00 40 ug/min via INTRAVENOUS
  Filled 2019-08-20 (×5): qty 250

## 2019-08-20 MED ORDER — SODIUM CHLORIDE 0.9% FLUSH
10.0000 mL | Freq: Two times a day (BID) | INTRAVENOUS | Status: DC
  Administered 2019-08-20 (×2): 10 mL

## 2019-08-20 MED ORDER — SODIUM CHLORIDE 0.9 % IV BOLUS
500.0000 mL | Freq: Once | INTRAVENOUS | Status: AC
  Administered 2019-08-20: 11:00:00 500 mL via INTRAVENOUS

## 2019-08-20 MED ORDER — SODIUM CHLORIDE 0.9 % IV SOLN
2.0000 g | INTRAVENOUS | Status: DC
  Administered 2019-08-20 – 2019-08-22 (×3): 2 g via INTRAVENOUS
  Filled 2019-08-20 (×3): qty 2

## 2019-08-20 MED ORDER — VASOPRESSIN 20 UNIT/ML IV SOLN
0.0100 [IU]/min | INTRAVENOUS | Status: DC
  Administered 2019-08-20: 15:00:00 0.02 [IU]/min via INTRAVENOUS
  Administered 2019-08-21 – 2019-08-22 (×2): 0.04 [IU]/min via INTRAVENOUS
  Filled 2019-08-20 (×4): qty 2

## 2019-08-20 MED ORDER — ALBUMIN HUMAN 5 % IV SOLN
12.5000 g | Freq: Once | INTRAVENOUS | Status: AC
  Administered 2019-08-20: 13:00:00 12.5 g via INTRAVENOUS

## 2019-08-20 MED ORDER — SODIUM CHLORIDE 0.9% FLUSH: 10.0000 mL | INTRAVENOUS | Status: DC | PRN

## 2019-08-20 MED ORDER — LACTATED RINGERS IV SOLN: INTRAVENOUS | Status: DC

## 2019-08-20 NOTE — Anesthesia Postprocedure Evaluation (Signed)
Anesthesia Post Note  Patient: [name]  Procedure(s) Performed: PERICARDIAL WINDOW WITH THORACOTOMY APPROACH (Left ) TRANSESOPHAGEAL ECHOCARDIOGRAM (TEE) (Left )     Patient location during evaluation: SICU Anesthesia Type: General Level of consciousness: sedated Pain management: pain level controlled Vital Signs Assessment: post-procedure vital signs reviewed and stable Respiratory status: patient remains intubated per anesthesia plan Cardiovascular status: stable Postop Assessment: no apparent nausea or vomiting Anesthetic complications: no    Last Vitals:  Vitals:   08/20/19 0757 08/20/19 0851  BP:  (!) 98/48  Pulse:  (!) 133  Resp:  (!) 30  Temp: 36.5 C   SpO2:  94%    Last Pain:  Vitals:   08/20/19 0757  TempSrc: Oral  PainSc:                  [place] S

## 2019-08-20 NOTE — Progress Notes (Signed)
ANTICOAGULATION CONSULT NOTE - Follow Up Consult  Pharmacy Consult for heparin Indication: DVT  Labs: Recent Labs    08/17/19 0616 08/18/19 0857 08/07/2019 0226 08/04/2019 0226 08/04/2019 1654 08/20/19 0316  HGB   < >  --  15.5   < > 13.1 14.7  HCT   < >  --  47.4  --  41.1 44.8  PLT   < >  --  171  --  142* 141*  LABPROT  --   --   --   --   --  17.1*  INR  --   --   --   --   --  1.4*  HEPARINUNFRC  --   --   --   --   --  0.93*  CREATININE  --  2.63* 3.27*  --   --  3.31*   < > = values in this interval not displayed.    Assessment: 64yo male supratherapeutic on heparin with initial dosing and after resumed s/p pericardial window; no gtt issues or signs of bleeding per RN.  Goal of Therapy:  Heparin level 0.3-0.7 units/ml   Plan:  Will decrease heparin gtt by 3 units/kg/hr to 1400 units/hr and check level in 8 hours.    Wynona Neat, PharmD, BCPS  08/20/2019,4:20 AM

## 2019-08-20 NOTE — Progress Notes (Signed)
I spoke with the patient's wife to make sure she knew we were aware of his procedure yesterday and his current status. If he is stable enough to come for simulation tomorrow morning by [facility]link we would proceed with simulation and anticipate starting xrt on Thursday, if not, we will reconvene about simulating Thursday or Friday.     Carola Rhine, PAC

## 2019-08-20 NOTE — Progress Notes (Signed)
ANTICOAGULATION CONSULT NOTE  Pharmacy Consult for heparin  Indication: DVT  No Known Allergies  Patient Measurements: Height: 6\' 1"  (185.4 cm) Weight: 220 lb 14.4 oz (100.2 kg) IBW/kg (Calculated) : 79.9 Heparin Dosing Weight: 100.8  Vital Signs: Temp: 98.4 F (36.9 C) (02/23 1529) Temp Source: Oral (02/23 1529) BP: 103/83 (02/23 1300) Pulse Rate: 141 (02/23 1700)  Labs: Recent Labs    08/18/19 0857 08/17/2019 0226 07/31/2019 0226 08/14/2019 1654 08/20/19 0316 08/20/19 1425  HGB  --  15.5   < > 13.1 14.7  --   HCT  --  47.4  --  41.1 44.8  --   PLT  --  171  --  142* 141*  --   LABPROT  --   --   --   --  17.1*  --   INR  --   --   --   --  1.4*  --   HEPARINUNFRC  --   --   --   --  0.93* 0.99*  CREATININE 2.63* 3.27*  --   --  3.31*  --    < > = values in this interval not displayed.    Estimated Creatinine Clearance: 29.2 mL/min (A) (by C-G formula based on SCr of 3.31 mg/dL (H)).   Medical History: Past Medical History:  Diagnosis Date  . ED (erectile dysfunction)   . Hyperlipidemia   . Hypertension   . Lung cancer [facility])      Assessment: Patient with stage 4 lung cancer with a pleural drainage catheter for recurrent pleural effusions. Vascular ultrasound of left upper extremity positive for new DVTs. Will initiate heparin drip.  Heparin trended up to 0.99 this afternoon despite rate decrease earlier today. CBC stable, noted AKI - SCr stable today with no emergent need to RRT for now per Nephrology. No bleeding or issues with infusion per discussion with RN.  Goal of Therapy:  Heparin level 0.3-0.7 units/ml Monitor platelets by anticoagulation protocol: Yes   Plan:  Decrease heparin IV to 1100 units/hr Check 6hr heparin level Monitor daily CBC, s/sx bleeding   Elicia Lamp, PharmD, BCPS Please check AMION for all [place] contact numbers Clinical Pharmacist 08/20/2019 5:24 PM

## 2019-08-20 NOTE — Progress Notes (Signed)
1 Day Post-Op Procedure(s) (LRB): PERICARDIAL WINDOW WITH THORACOTOMY APPROACH (Left) TRANSESOPHAGEAL ECHOCARDIOGRAM (TEE) (Left) Subjective: Intubated/sedated  Objective: Vital signs in last 24 hours: Temp:  [97.6 F (36.4 C)-97.9 F (36.6 C)] 97.7 F (36.5 C) (02/23 0400) Pulse Rate:  [90-171] 112 (02/23 0700) Cardiac Rhythm: Normal sinus rhythm;Sinus tachycardia (02/23 0400) Resp:  [6-44] 17 (02/23 0700) BP: (49-194)/(20-115) 90/65 (02/23 0700) SpO2:  [87 %-100 %] 98 % (02/23 0700) Arterial Line BP: (96-140)/(50-81) 101/58 (02/23 0700) FiO2 (%):  [40 %-100 %] 40 % (02/23 0400) Weight:  [100.2 kg] 100.2 kg (02/23 0500)  Hemodynamic parameters for last 24 hours: CVP:  [9 mmHg] (P) 9 mmHg  Intake/Output from previous day: 02/22 0701 - 02/23 0700 In: 2766.4 [I.V.:2416.4; IV Piggyback:350] Out: 0383 [Urine:585; Blood:500; Chest Tube:2348] Intake/Output this shift: No intake/output data recorded.  General appearance: intubated/sedated Neurologic: unable to fully assess Heart: tachycardiac Lungs: diminished breath sounds LLL and LUL Abdomen: soft, non-tender; bowel sounds normal; no masses,  no organomegaly Wound: dressed, dry  Lab Results: Recent Labs    08/05/2019 1654 08/20/19 0316  WBC 16.1* 16.8*  HGB 13.1 14.7  HCT 41.1 44.8  PLT 142* 141*   BMET:  Recent Labs    07/31/2019 0226 08/20/19 0316  NA 141 139  K 4.7 4.7  CL 103 104  CO2 24 23  GLUCOSE 134* 115*  BUN 82* 88*  CREATININE 3.27* 3.31*  CALCIUM 11.9* 10.8*    PT/INR:  Recent Labs    08/20/19 0316  LABPROT 17.1*  INR 1.4*   ABG No results found for: PHART, HCO3, TCO2, ACIDBASEDEF, O2SAT CBG (last 3)  Recent Labs    08/20/19 0024  GLUCAP 102*    Assessment/Plan: S/P Procedure(s) (LRB): PERICARDIAL WINDOW WITH THORACOTOMY APPROACH (Left) TRANSESOPHAGEAL ECHOCARDIOGRAM (TEE) (Left) Mobilize leave drain today  Ok to try to extubate   LOS: 5 days    Wonda Olds 08/20/2019

## 2019-08-20 NOTE — Procedures (Signed)
Extubation Procedure Note  Patient Details:   Name: [name] DOB: December 19, 1955 MRN: [id number]   Airway Documentation:    Vent end date: 08/20/19 Vent end time: 0848   Evaluation  O2 sats: stable throughout Complications: No apparent complications Patient did tolerate procedure well. Bilateral Breath Sounds: Clear, Diminished   Yes   Patient extubated to 4L nasal cannula per MD order.  Positive cuff leak noted.  No evidence of stridor.  Patient able to speak post extubation.  Sats currently 95%.  Vitals are stable.  No complications noted at this time.   Judith Part 08/20/2019, 8:52 AM

## 2019-08-20 NOTE — Progress Notes (Signed)
[facility] Progress Note  Name: [name] MRN: [id number] DOB: [birth year]  Chief Complaint:  Shortness of breath   Subjective:  Did ok overnight per nursing.  He had a pericardial window for pericardial effusion with tamponade on 2/22.  Hemodynamics improved after intervention and CRRT was held off.  Had 585 ml UOP over 2/22 charted.  On levo at 3 mcg/min   Review of systems:  Unable to obtain secondary to intubated ----------------- Background:  [name] is a 64 y.o. male with a history of stage IV lung cancer, tobacco abuse, and recent covid infection (diagonsed on 1/27 and assessed as being out of the infectious window) who presented to the hospital with shortness of breath.  The stage IV lung cancer is a relatively new diagnosis and has also had recurrent left pleural effusion which has required multiple thoracenteses.  He had a thoracenteses in the ER at [facility] with removal of 1.5 L of bloody fluid.  He has been transferred to [facility] and is in the ICU.   He had a Pleurx catheter placement on 2/9 by thoracic surgery.  His course was complicated by hypotension at that time.  Blood pressures charted as being as low as the 70s.  He received pressors and 3 doses of albumin.  Per pulmonology/critical care, they are trying to get the patient to [facility] for radiation if possible.  Note that his course has also been complicated by hypercalcemia.  Calcium has improved from 13.9 on admission.  He had 450 mL of urine over 2/20.  On arrival to unit, patient found to be acutely hypotensive 67'Y systolic and below and obtunded and pulm was just notified of pericardial effusion.  Nursing applying pads to patient.  His wife would want for him to have CRRT when needed.     Intake/Output Summary (Last 24 hours) at 08/20/2019 0624 Last data filed at 08/20/2019 0600 Gross per 24 hour  Intake 2973.06 ml  Output 3433 ml  Net -459.94 ml    Vitals:  Vitals:    08/20/19 0326 08/20/19 0400 08/20/19 0500 08/20/19 0600  BP:  104/65 115/72 108/86  Pulse:  (!) 102 (!) 102 (!) 111  Resp:  16 17 (!) 21  Temp:  97.7 F (36.5 C)    TempSrc:  Oral    SpO2: 100% 99% 99% 98%  Weight:   100.2 kg   Height:         Physical Exam:  General adult male in bed intubated HEENT normocephalic atraumatic Neck supple trachea midline Lungs reduced breath sounds left; pleurex in place; clearer on right Heart S1S2 RRR Abdomen softly dist on sedation - cannot assess tenderness. Extremities LUE with 2+edema; no lower extremity edema; trace to 1+ RUE edema Neuro - on continuous sedation  GU has foley with urine output   Medications reviewed     Labs:  BMP Latest Ref Rng & Units 08/20/2019 08/12/2019 08/18/2019  Glucose 70 - 99 mg/dL 115(H) 134(H) 140(H)  BUN 8 - 23 mg/dL 88(H) 82(H) 71(H)  Creatinine 0.61 - 1.24 mg/dL 3.31(H) 3.27(H) 2.63(H)  BUN/Creat Ratio 10 - 24 - - -  Sodium 135 - 145 mmol/L 139 141 140  Potassium 3.5 - 5.1 mmol/L 4.7 4.7 4.7  Chloride 98 - 111 mmol/L 104 103 103  CO2 22 - 32 mmol/L 23 24 21(L)  Calcium 8.9 - 10.3 mg/dL 10.8(H) 11.9(H) 12.1(H)     Assessment/Plan:   # Acute renal failure Likely  secondary to ischemic and prerenal insults from setting of hypotension with pericardial effusion/tamponade as well as hypercalcemia.  Renal US with echogenic kidneys without hydro.  On lisinopril and HCTZ at home.  His wife would want for him to have RRT if/when needed.  UA neg protein and 0-5 RBC - Hopeful for plateau of AKI.  No emergent indication for renal replacement therapy .  Monitoring for need  - Send up/c ratio when able    # Shock cardiogenic  - improving; s/p pericardial window - on levo - 3 mcg/min.  Team weaning as able   # pericardial effusion  - Per CTS; s/p window on 2/22  # Acute hypoxic respiratory failure - on vent.  Per pulmonology - defer IV fluids for now   # Hypercalcemia -Setting of malignancy -  improving - s/p calcitonin   # Stage IV lung cancer - Primary team has consulted onc and rad onc   # hx of Covid positive - Test was on 1/27.  Patient now out of infectious window per pulm   Claudia Desanctis, MD 08/20/2019 6:40 AM

## 2019-08-20 NOTE — Progress Notes (Signed)
      [address]       [place] [address]             [phone]      Extubated earlier today  BP 103/83   Pulse (!) 141   Temp 98.4 F (36.9 C) (Oral)   Resp (!) 23   Ht 6\' 1"  (1.854 m)   Wt 100.2 kg   SpO2 97%   BMI 29.14 kg/m  On levophed and vasopressin 350 ml from CT this afternoon- serosanguinous  Intake/Output Summary (Last 24 hours) at 08/20/2019 1756 Last data filed at 08/20/2019 1700 Gross per 24 hour  Intake 2832.01 ml  Output 2694 ml  Net 138.01 ml   CCM managing hemodynamics  Remo Lipps C. Roxan Hockey, MD [facility] [phone]

## 2019-08-20 NOTE — Progress Notes (Signed)
ANTICOAGULATION CONSULT NOTE  Pharmacy Consult for heparin  Indication: DVT  No Known Allergies  Patient Measurements: Height: 6\' 1"  (185.4 cm) Weight: 220 lb 14.4 oz (100.2 kg) IBW/kg (Calculated) : 79.9 Heparin Dosing Weight: 100.8  Vital Signs: Temp: 98.3 F (36.8 C) (02/23 2000) Temp Source: Oral (02/23 2000) BP: 132/55 (02/23 2303) Pulse Rate: 113 (02/23 2200)  Labs: Recent Labs    08/18/19 0857 08/05/2019 0226 08/04/2019 0226 07/29/2019 1654 08/20/19 0316 08/20/19 1425 08/20/19 2235  HGB  --  15.5   < > 13.1 14.7  --   --   HCT  --  47.4  --  41.1 44.8  --   --   PLT  --  171  --  142* 141*  --   --   LABPROT  --   --   --   --  17.1*  --   --   INR  --   --   --   --  1.4*  --   --   HEPARINUNFRC  --   --   --   --  0.93* 0.99* 0.74*  CREATININE 2.63* 3.27*  --   --  3.31*  --   --    < > = values in this interval not displayed.    Estimated Creatinine Clearance: 29.2 mL/min (A) (by C-G formula based on SCr of 3.31 mg/dL (H)).   Assessment: Patient with stage 4 lung cancer with a pleural drainage catheter for recurrent pleural effusions. Vascular ultrasound of left upper extremity positive for new DVTs. Will initiate heparin drip.  Heparin trended still slightly supratherapeutic (0.74) on gtt at 1100 units/hr. No bleeding or issues with infusion per discussion with RN.  Goal of Therapy:  Heparin level 0.3-0.7 units/ml Monitor platelets by anticoagulation protocol: Yes   Plan:  Decrease heparin IV to 1000 units/hr Check 6hr heparin level  Sherlon Handing, PharmD, BCPS Please see amion for complete clinical pharmacist phone list 08/20/2019 11:20 PM

## 2019-08-20 NOTE — Progress Notes (Signed)
NAME:  [name], MRN:  [id number], DOB:  26-Nov-1955, LOS: 5 ADMISSION DATE:  08/25/2019, CONSULTATION DATE:  2/19 REFERRING MD:  Broadus John, CHIEF COMPLAINT:  Post-op hypotension    Brief History   Mr. Miron is a 64 year old gentleman with a medical history significant for hyperlipidemia, Covid infection on 1/27 now out of infectious window, and tobacco abuse with recently diagnosed metastatic non-small cell lung cancer with recurrent left malignant pleural effusion.  Admitted due to his recurrent pleural effusions for Pleurx tube placement.  Remained hypotensive during procedure, CCM was then consulted on 2/19 to manage pressors.    History of present illness   64 year old AA gentleman admitted on 2/18 due to worsening shortness of breath and exertional dyspnea in the setting of recently diagnosed stage IV lung cancer with recurrent left pleural effusion.  He is already undergone multiple thoracentesis with rapid removal accumulation of fluid.  CXR showed complete opacification of his left thorax with CT chest showing large left pleural effusion with midline shift.  Underwent a thoracentesis in the ER at [facility], yielding a 1.5L bloody pleural fluid.  He was transferred to [facility] for thoracic surgery evaluation and Pleurx catheter placement.  He successfully had a Pleurx catheter placed on 2/19 by thoracic surgery.  They were able to remove 2L of pleural fluid, however intraoperatively he developed hypotension with systolics as low as the 78L.  He was given 3 doses of albumin and then started on low-dose phenylephrine with resultant stable blood pressures in the 90s.  CCM was consulted for postoperative hypotension management.  Past Medical History  History of tobacco abuse, hyperlipidemia, recently diagnosed with metastatic non-small cell lung cancer with recurrent left malignant pleural effusion  Significant Hospital Events   2/18: Thoracentesis in the ED, transferred to [facility] 2/19: Pleurx catheter placement and thoracentesis, started Neo-Synephrine gtt 2 2/22: Deteriorated, intubated.  Pericardial tamponade, pericardial window performed.  2/23: Extubated successfully this morning  Consults:  Thoracic surgery 2/18 Critical care 2/19 2/22: medonc, radonc, nephrology, palliative care  Procedures:  Pleurx catheter placement 2/19 Pericardial window/TEE on 2/22 due to pericardial effusion with tamponade  Significant Diagnostic Tests:    Micro Data:  2/18 pleural fluid>>>neg 2/18 Gram stain pleural fluid>>>neg  Antimicrobials:  Ancef x1 Intra-Op  Interim history/subjective:  Yesterday afternoon, 2/22, patient deteriorated during echocardiogram with worsening hypotension and obtunded.  Echo with large pericardial effusion and complex appearing fluid causing collapse of RA/RV.  He was then intubated with central venous and arterial lines placed. Then underwent a pericardial window by CTS.   This morning he was successfully extubated, placed on 4L. Still quite tachycardic and requiring increasing levophed to maintain blood pressure. Less confused per wife at bedside. Able to say where he is and who he is.   Objective   Blood pressure 90/65, pulse (!) 112, temperature 97.7 F (36.5 C), temperature source Oral, resp. rate 17, height 6\' 1"  (1.854 m), weight 100.2 kg, SpO2 98 %. CVP:  [9 mmHg] (P) 9 mmHg  Vent Mode: PRVC FiO2 (%):  [40 %-100 %] 40 % Set Rate:  [16 bmp] 16 bmp Vt Set:  [630 mL-640 mL] 630 mL PEEP:  [5 cmH20] 5 cmH20 Plateau Pressure:  [21 FYB01-75 cmH20] 21 cmH20   Intake/Output Summary (Last 24 hours) at 08/20/2019 0804 Last data filed at 08/20/2019 0700 Gross per 24 hour  Intake 2512.3 ml  Output 3433 ml  Net -920.7 ml   Filed Weights   08/18/19 0500  08/05/2019 0338 08/20/19 0500  Weight: 100.3 kg 102.5 kg 100.2 kg    Examination: General: Middle-aged gentleman with mild increased WOB, just extubated HEENT: MM dry Cardiac:  Tachycardic, regular rhythm Pulmonary: Diminished breath sounds on the left, Pleurx catheter in place with serosanguineous drainage.  Now on 4 LNC satting appropriately.  Tachypneic without accessory muscle use.  No stridor. GI: Soft Extremities: Left upper arm swelling in comparison to right, no lower extremity edema Psych/neuro: Drowsy, just extubated weaning off of sedation, responds to voice.  Knows his name and that he is in the hospital.  Repetitive speech.   Resolved Hospital Problem list     Assessment & Plan:   Shock, Cardiogenic and Hypovolemic  Intubated on 2/22, extubated successfully this am. In the setting of large pericardial effusion, now s/p pericardial window on 2/22. Hypovolemic on exam, with compensatory sinus tachycardia.  Plan: -Continue Levophed gtt, wean as tolerated, hopeful as volume status improves w/ IVF  -Midodrine 10mg  TID  -500 cc bolus x2, start LR 100 ml/hr   Stage IV non-small cell lung cancer of LUL with left mainstem obstruction and recurrent left pleural effusion Widespread metastatic disease.  Proceeding with palliative radiation, poor candidate for concurrent chemotherapy per medical oncology. Plan: -Hopeful for transfer to [facility] this afternoon to start palliative radiotherapy tomorrow, 2/24 -Awaiting brain MRI to evaluate for mets -Palliative care consulted to establish relationship as he has worsening deterioration/organ failure related to malignancy  Recurrent left pleural effusion, in the setting of above S/p Pleurx catheter drain placement on 2/19. Plan: -Intermittent pleural drainage --Maintain pleurx in place for now   Large pericardial effusion, presumed malignant, with tamponade physiology S/p pericardial window by CTS on 2/22. Plan: -Drain in place per CTS  Acute renal failure, worsening  Cr 3.31 today, BL 1.0.  Felt to be ischemic ATN/prerenal injury in the setting of recent hypotension, hypercalcemia.  No emergent  indication for RRT per nephrology.  Plan: -Appreciate assistance per nephrology -Manage hypotension as above  -Follow-up urine P/C ratio -Renal panel in am   Acute left DVT Associated with LUE swelling.  U/S on 2/22 showing DVT involving the left subclavian, axillary, and brachial veins. Plan: -IV heparin started on 2/22  Malignant hypercalcemia, improving  Corrected ca 11.4.  Received calcitonin and Zometa on 2/19. Plan: -Monitor calcium level -Can consider repeating Zometa on 2/26 if increasing  COVID positive 1/27 Out of infectious window.  Leukocytosis WBC 16.8.  Likely reactive due to critical illness.  No objective evidence of infection, but will monitor closely as may have component of post-obstructive process.  Disposition: Hopeful for transfer to [facility] this afternoon for radiotherapy 2/24, currently working towards optimizing medical therapy to allow for stable transfer.    Patriciaann Clan, DO  Family Medicine PGY-2

## 2019-08-21 ENCOUNTER — Ambulatory Visit: Payer: BC Managed Care – PPO | Admitting: Radiation Oncology

## 2019-08-21 ENCOUNTER — Ambulatory Visit: Payer: BC Managed Care – PPO | Admitting: Cardiothoracic Surgery

## 2019-08-21 ENCOUNTER — Inpatient Hospital Stay (HOSPITAL_COMMUNITY): Payer: BC Managed Care – PPO

## 2019-08-21 DIAGNOSIS — C3412 Malignant neoplasm of upper lobe, left bronchus or lung: Secondary | ICD-10-CM | POA: Diagnosis not present

## 2019-08-21 DIAGNOSIS — Z452 Encounter for adjustment and management of vascular access device: Secondary | ICD-10-CM | POA: Diagnosis not present

## 2019-08-21 DIAGNOSIS — J939 Pneumothorax, unspecified: Secondary | ICD-10-CM | POA: Diagnosis not present

## 2019-08-21 DIAGNOSIS — Z7189 Other specified counseling: Secondary | ICD-10-CM | POA: Diagnosis not present

## 2019-08-21 DIAGNOSIS — C3490 Malignant neoplasm of unspecified part of unspecified bronchus or lung: Secondary | ICD-10-CM | POA: Diagnosis not present

## 2019-08-21 DIAGNOSIS — C3492 Malignant neoplasm of unspecified part of left bronchus or lung: Secondary | ICD-10-CM | POA: Diagnosis not present

## 2019-08-21 DIAGNOSIS — Z515 Encounter for palliative care: Secondary | ICD-10-CM | POA: Diagnosis not present

## 2019-08-21 DIAGNOSIS — J9601 Acute respiratory failure with hypoxia: Secondary | ICD-10-CM | POA: Diagnosis not present

## 2019-08-21 DIAGNOSIS — J9690 Respiratory failure, unspecified, unspecified whether with hypoxia or hypercapnia: Secondary | ICD-10-CM | POA: Diagnosis not present

## 2019-08-21 DIAGNOSIS — N179 Acute kidney failure, unspecified: Secondary | ICD-10-CM | POA: Diagnosis not present

## 2019-08-21 DIAGNOSIS — J910 Malignant pleural effusion: Secondary | ICD-10-CM | POA: Diagnosis not present

## 2019-08-21 DIAGNOSIS — A419 Sepsis, unspecified organism: Secondary | ICD-10-CM

## 2019-08-21 DIAGNOSIS — I319 Disease of pericardium, unspecified: Secondary | ICD-10-CM | POA: Diagnosis not present

## 2019-08-21 DIAGNOSIS — R570 Cardiogenic shock: Secondary | ICD-10-CM | POA: Diagnosis not present

## 2019-08-21 LAB — TYPE AND SCREEN
ABO/RH(D): O POS
Antibody Screen: NEGATIVE
Unit division: 0
Unit division: 0

## 2019-08-21 LAB — POCT I-STAT 7, (LYTES, BLD GAS, ICA,H+H)
Acid-base deficit: 7 mmol/L — ABNORMAL HIGH (ref 0.0–2.0)
Acid-base deficit: 6 mmol/L — ABNORMAL HIGH (ref 0.0–2.0)
Bicarbonate: 19.7 mmol/L — ABNORMAL LOW (ref 20.0–28.0)
Bicarbonate: 21.5 mmol/L (ref 20.0–28.0)
Calcium, Ion: 1.44 mmol/L — ABNORMAL HIGH (ref 1.15–1.40)
Calcium, Ion: 1.3 mmol/L (ref 1.15–1.40)
HCT: 43 % (ref 39.0–52.0)
HCT: 43 % (ref 39.0–52.0)
Hemoglobin: 14.6 g/dL (ref 13.0–17.0)
Hemoglobin: 14.6 g/dL (ref 13.0–17.0)
O2 Saturation: 98 %
O2 Saturation: 99 %
Patient temperature: 36.9
Patient temperature: 35.9
Potassium: 5.1 mmol/L (ref 3.5–5.1)
Potassium: 5.1 mmol/L (ref 3.5–5.1)
Sodium: 137 mmol/L (ref 135–145)
Sodium: 138 mmol/L (ref 135–145)
TCO2: 21 mmol/L — ABNORMAL LOW (ref 22–32)
TCO2: 23 mmol/L (ref 22–32)
pCO2 arterial: 42.8 mmHg (ref 32.0–48.0)
pCO2 arterial: 47.3 mmHg (ref 32.0–48.0)
pH, Arterial: 7.271 — ABNORMAL LOW (ref 7.350–7.450)
pH, Arterial: 7.259 — ABNORMAL LOW (ref 7.350–7.450)
pO2, Arterial: 123 mmHg — ABNORMAL HIGH (ref 83.0–108.0)
pO2, Arterial: 137 mmHg — ABNORMAL HIGH (ref 83.0–108.0)

## 2019-08-21 LAB — BASIC METABOLIC PANEL
Anion gap: 19 — ABNORMAL HIGH (ref 5–15)
Anion gap: 16 — ABNORMAL HIGH (ref 5–15)
BUN: 99 mg/dL — ABNORMAL HIGH (ref 8–23)
BUN: 86 mg/dL — ABNORMAL HIGH (ref 8–23)
CO2: 17 mmol/L — ABNORMAL LOW (ref 22–32)
CO2: 19 mmol/L — ABNORMAL LOW (ref 22–32)
Calcium: 10.4 mg/dL — ABNORMAL HIGH (ref 8.9–10.3)
Calcium: 8.9 mg/dL (ref 8.9–10.3)
Chloride: 107 mmol/L (ref 98–111)
Chloride: 106 mmol/L (ref 98–111)
Creatinine, Ser: 3.88 mg/dL — ABNORMAL HIGH (ref 0.61–1.24)
Creatinine, Ser: 3.14 mg/dL — ABNORMAL HIGH (ref 0.61–1.24)
GFR calc Af Amer: 18 mL/min — ABNORMAL LOW (ref >60)
GFR calc Af Amer: 24 mL/min — ABNORMAL LOW (ref >60)
GFR calc non Af Amer: 16 mL/min — ABNORMAL LOW (ref >60)
GFR calc non Af Amer: 20 mL/min — ABNORMAL LOW (ref >60)
Glucose, Bld: 133 mg/dL — ABNORMAL HIGH (ref 70–99)
Glucose, Bld: 130 mg/dL — ABNORMAL HIGH (ref 70–99)
Potassium: 5.1 mmol/L (ref 3.5–5.1)
Potassium: 5 mmol/L (ref 3.5–5.1)
Sodium: 143 mmol/L (ref 135–145)
Sodium: 141 mmol/L (ref 135–145)

## 2019-08-21 LAB — HEPATIC FUNCTION PANEL
ALT: 21 U/L (ref 0–44)
AST: 29 U/L (ref 15–41)
Albumin: 2.6 g/dL — ABNORMAL LOW (ref 3.5–5.0)
Alkaline Phosphatase: 69 U/L (ref 38–126)
Bilirubin, Direct: 0.4 mg/dL — ABNORMAL HIGH (ref 0.0–0.2)
Indirect Bilirubin: 0.5 mg/dL (ref 0.3–0.9)
Total Bilirubin: 0.9 mg/dL (ref 0.3–1.2)
Total Protein: 5.1 g/dL — ABNORMAL LOW (ref 6.5–8.1)

## 2019-08-21 LAB — BPAM RBC
Blood Product Expiration Date: 202103242359
Blood Product Expiration Date: 202103242359
Unit Type and Rh: 5100
Unit Type and Rh: 5100

## 2019-08-21 LAB — CBC WITH DIFFERENTIAL/PLATELET
Abs Immature Granulocytes: 0.16 10*3/uL — ABNORMAL HIGH (ref 0.00–0.07)
Basophils Absolute: 0 10*3/uL (ref 0.0–0.1)
Basophils Relative: 0 %
Eosinophils Absolute: 0 10*3/uL (ref 0.0–0.5)
Eosinophils Relative: 0 %
HCT: 45.8 % (ref 39.0–52.0)
Hemoglobin: 14.9 g/dL (ref 13.0–17.0)
Immature Granulocytes: 1 %
Lymphocytes Relative: 2 %
Lymphs Abs: 0.4 10*3/uL — ABNORMAL LOW (ref 0.7–4.0)
MCH: 29.9 pg (ref 26.0–34.0)
MCHC: 32.5 g/dL (ref 30.0–36.0)
MCV: 92 fL (ref 80.0–100.0)
Monocytes Absolute: 1.7 10*3/uL — ABNORMAL HIGH (ref 0.1–1.0)
Monocytes Relative: 8 %
Neutro Abs: 19.8 10*3/uL — ABNORMAL HIGH (ref 1.7–7.7)
Neutrophils Relative %: 89 %
Platelets: 208 10*3/uL (ref 150–400)
RBC: 4.98 MIL/uL (ref 4.22–5.81)
RDW: 15 % (ref 11.5–15.5)
WBC: 22 10*3/uL — ABNORMAL HIGH (ref 4.0–10.5)
nRBC: 0.1 % (ref 0.0–0.2)

## 2019-08-21 LAB — MAGNESIUM: Magnesium: 2.3 mg/dL (ref 1.7–2.4)

## 2019-08-21 LAB — CYTOLOGY - NON PAP

## 2019-08-21 LAB — PHOSPHORUS: Phosphorus: 3.6 mg/dL (ref 2.5–4.6)

## 2019-08-21 LAB — SURGICAL PATHOLOGY

## 2019-08-21 LAB — GLUCOSE, CAPILLARY: Glucose-Capillary: 101 mg/dL — ABNORMAL HIGH (ref 70–99)

## 2019-08-21 LAB — HEPARIN LEVEL (UNFRACTIONATED): Heparin Unfractionated: 0.46 IU/mL (ref 0.30–0.70)

## 2019-08-21 MED ORDER — VITAL HIGH PROTEIN PO LIQD
1000.0000 mL | ORAL | Status: DC
  Administered 2019-08-21: 1000 mL

## 2019-08-21 MED ORDER — ORAL CARE MOUTH RINSE: 15.0000 mL | OROMUCOSAL | Status: DC

## 2019-08-21 MED ORDER — MIDAZOLAM HCL 2 MG/2ML IJ SOLN
2.0000 mg | Freq: Once | INTRAMUSCULAR | Status: AC
  Administered 2019-08-21: 2 mg via INTRAVENOUS

## 2019-08-21 MED ORDER — FENTANYL BOLUS VIA INFUSION
50.0000 ug | INTRAVENOUS | Status: DC | PRN
  Administered 2019-08-21: 50 ug via INTRAVENOUS
  Filled 2019-08-21: qty 50

## 2019-08-21 MED ORDER — FENTANYL CITRATE (PF) 100 MCG/2ML IJ SOLN
100.0000 ug | Freq: Once | INTRAMUSCULAR | Status: AC
  Administered 2019-08-21: 100 ug via INTRAVENOUS

## 2019-08-21 MED ORDER — PHENYLEPHRINE CONCENTRATED 100MG/250ML (0.4 MG/ML) INFUSION SIMPLE
0.0000 ug/min | INTRAVENOUS | Status: DC
  Administered 2019-08-21: 10 ug/min via INTRAVENOUS
  Administered 2019-08-22 (×2): 400 ug/min via INTRAVENOUS
  Filled 2019-08-21 (×4): qty 250

## 2019-08-21 MED ORDER — PRISMASOL BGK 4/2.5 32-4-2.5 MEQ/L IV SOLN: INTRAVENOUS | Status: DC

## 2019-08-21 MED ORDER — ORAL CARE MOUTH RINSE
15.0000 mL | OROMUCOSAL | Status: DC
  Administered 2019-08-21 – 2019-08-22 (×7): 15 mL via OROMUCOSAL

## 2019-08-21 MED ORDER — AMIODARONE HCL IN DEXTROSE 360-4.14 MG/200ML-% IV SOLN
30.0000 mg/h | INTRAVENOUS | Status: DC
  Administered 2019-08-22: 11:00:00 30 mg/h via INTRAVENOUS
  Filled 2019-08-21: qty 200

## 2019-08-21 MED ORDER — FENTANYL CITRATE (PF) 100 MCG/2ML IJ SOLN
INTRAMUSCULAR | Status: AC
  Filled 2019-08-21: qty 2

## 2019-08-21 MED ORDER — SODIUM CHLORIDE 0.9 % IV BOLUS
500.0000 mL | Freq: Once | INTRAVENOUS | Status: AC
  Administered 2019-08-21: 22:00:00 500 mL via INTRAVENOUS

## 2019-08-21 MED ORDER — FUROSEMIDE 10 MG/ML IJ SOLN
60.0000 mg | Freq: Once | INTRAMUSCULAR | Status: AC
  Administered 2019-08-21: 09:00:00 60 mg via INTRAVENOUS
  Filled 2019-08-21: qty 6

## 2019-08-21 MED ORDER — AMIODARONE LOAD VIA INFUSION
150.0000 mg | Freq: Once | INTRAVENOUS | Status: AC
  Administered 2019-08-21: 23:00:00 150 mg via INTRAVENOUS
  Filled 2019-08-21: qty 83.34

## 2019-08-21 MED ORDER — ROCURONIUM BROMIDE 50 MG/5ML IV SOLN
100.0000 mg | Freq: Once | INTRAVENOUS | Status: AC
  Administered 2019-08-21: 100 mg via INTRAVENOUS
  Filled 2019-08-21: qty 10

## 2019-08-21 MED ORDER — ETOMIDATE 2 MG/ML IV SOLN
20.0000 mg | Freq: Once | INTRAVENOUS | Status: AC
  Administered 2019-08-21: 20 mg via INTRAVENOUS

## 2019-08-21 MED ORDER — VECURONIUM BROMIDE 10 MG IV SOLR
INTRAVENOUS | Status: AC
  Filled 2019-08-21: qty 10

## 2019-08-21 MED ORDER — MIDAZOLAM HCL 2 MG/2ML IJ SOLN
INTRAMUSCULAR | Status: AC
  Filled 2019-08-21: qty 2

## 2019-08-21 MED ORDER — AMIODARONE HCL IN DEXTROSE 360-4.14 MG/200ML-% IV SOLN
60.0000 mg/h | INTRAVENOUS | Status: AC
  Administered 2019-08-21 – 2019-08-22 (×2): 60 mg/h via INTRAVENOUS

## 2019-08-21 MED ORDER — HEPARIN SODIUM (PORCINE) 1000 UNIT/ML DIALYSIS
1000.0000 [IU] | INTRAMUSCULAR | Status: DC | PRN
  Filled 2019-08-21: qty 6

## 2019-08-21 MED ORDER — SODIUM CHLORIDE 0.9 % IV SOLN
INTRAVENOUS | Status: DC | PRN
  Administered 2019-08-21: 11:00:00 250 mL via INTRAVENOUS

## 2019-08-21 MED ORDER — PRISMASOL BGK 4/2.5 32-4-2.5 MEQ/L REPLACEMENT SOLN: Status: DC

## 2019-08-21 MED ORDER — VECURONIUM BROMIDE 10 MG IV SOLR
10.0000 mg | Freq: Once | INTRAVENOUS | Status: AC
  Administered 2019-08-21: 10 mg via INTRAVENOUS

## 2019-08-21 MED ORDER — PRO-STAT SUGAR FREE PO LIQD
30.0000 mL | Freq: Two times a day (BID) | ORAL | Status: DC
  Administered 2019-08-21 – 2019-08-22 (×2): 30 mL
  Filled 2019-08-21 (×2): qty 30

## 2019-08-21 MED ORDER — FENTANYL 2500MCG IN NS 250ML (10MCG/ML) PREMIX INFUSION
0.0000 ug/h | INTRAVENOUS | Status: DC
  Administered 2019-08-21: 50 ug/h via INTRAVENOUS
  Filled 2019-08-21 (×2): qty 250

## 2019-08-21 MED ORDER — MIDAZOLAM HCL 2 MG/2ML IJ SOLN
INTRAMUSCULAR | Status: AC
  Administered 2019-08-21: 2 mg via INTRAVENOUS
  Filled 2019-08-21: qty 2

## 2019-08-21 MED ORDER — MIDAZOLAM HCL 2 MG/2ML IJ SOLN
INTRAMUSCULAR | Status: AC
  Filled 2019-08-21: qty 4

## 2019-08-21 MED ORDER — AMIODARONE HCL IN DEXTROSE 360-4.14 MG/200ML-% IV SOLN
INTRAVENOUS | Status: AC
  Filled 2019-08-21: qty 400

## 2019-08-21 MED ORDER — CHLORHEXIDINE GLUCONATE 0.12% ORAL RINSE (MEDLINE KIT)
15.0000 mL | Freq: Two times a day (BID) | OROMUCOSAL | Status: DC
  Administered 2019-08-21 – 2019-08-22 (×3): 15 mL via OROMUCOSAL

## 2019-08-21 MED ORDER — ASPIRIN 81 MG PO CHEW
81.0000 mg | CHEWABLE_TABLET | Freq: Every day | ORAL | Status: DC
  Administered 2019-08-21 – 2019-08-22 (×2): 81 mg via ORAL
  Filled 2019-08-21 (×2): qty 1

## 2019-08-21 NOTE — Procedures (Signed)
Bronchoscopy Procedure Note [name] [id number] 10/08/1955  Procedure: Bronchoscopy Indications: Diagnostic evaluation of the airways and Obtain specimens for culture and/or other diagnostic studies  Procedure Details Consent: Risks of procedure as well as the alternatives and risks of each were explained to the (patient/caregiver).  Consent for procedure obtained. Time Out: Verified patient identification, verified procedure, site/side was marked, verified correct patient position, special equipment/implants available, medications/allergies/relevent history reviewed, required imaging and test results available.  Performed  In preparation for procedure, patient was given 100% FiO2 and bronchoscope lubricated. Sedation: Benzodiazepines, Muscle relaxants and narcotics  Airway entered and the following bronchi were examined: RUL, RML, RLL, LUL, LLL and Bronchi.   Procedures performed: Washing performed. Obstructed LUL with minimal secretions. Intrinsic compressions.      Bronchoscope removed.  , Patient placed back on 100% FiO2 at conclusion of procedure.    Evaluation Hemodynamic Status: BP stable throughout; O2 sats: stable throughout Patient's Current Condition: stable Specimens:  Sent purulent fluid Complications: No apparent complications Patient did tolerate procedure well.   [name] 08/21/2019

## 2019-08-21 NOTE — Progress Notes (Signed)
ANTICOAGULATION CONSULT NOTE  Pharmacy Consult for heparin  Indication: DVT  No Known Allergies  Patient Measurements: Height: 6\' 1"  (185.4 cm) Weight: 221 lb 9 oz (100.5 kg) IBW/kg (Calculated) : 79.9 Heparin Dosing Weight: 100.8  Vital Signs: Temp: 97.8 F (36.6 C) (02/24 0740) Temp Source: Axillary (02/24 0740) BP: 116/58 (02/24 1200) Pulse Rate: 143 (02/24 1200)  Labs: Recent Labs    08/14/2019 0226 08/13/2019 0226 08/25/2019 1654 08/06/2019 1654 08/20/19 0316 08/20/19 0316 08/20/19 1425 08/20/19 2235 08/21/19 0327 08/21/19 1008 08/21/19 1020  HGB 15.5   < > 13.1   < > 14.7   < >  --   --  14.9  --  14.6  HCT 47.4   < > 41.1   < > 44.8  --   --   --  45.8  --  43.0  PLT 171   < > 142*  --  141*  --   --   --  208  --   --   LABPROT  --   --   --   --  17.1*  --   --   --   --   --   --   INR  --   --   --   --  1.4*  --   --   --   --   --   --   HEPARINUNFRC  --   --   --   --  0.93*   < > 0.99* 0.74*  --  0.46  --   CREATININE 3.27*  --   --   --  3.31*  --   --   --  3.88*  --   --    < > = values in this interval not displayed.    Estimated Creatinine Clearance: 24.9 mL/min (A) (by C-G formula based on SCr of 3.88 mg/dL (H)).   Assessment: Patient with stage 4 lung cancer with a pleural drainage catheter for recurrent pleural effusions. Vascular ultrasound of left upper extremity positive for new DVT and pharmacy dosing heparin -heparin level at goal  Goal of Therapy:  Heparin level 0.3-0.7 units/ml Monitor platelets by anticoagulation protocol: Yes   Plan:  Continue heparin at  1000 units/hr Daily heparin level and CBC  Hildred Laser, PharmD Clinical Pharmacist **Pharmacist phone directory can now be found on [email or web address] ([email or web address]).  Listed under [place].

## 2019-08-21 NOTE — Significant Event (Signed)
SIGNIFICANT EVENT  Pt went into SVT/AFIB RVR with rates ranging from 150-190. BP dropped to 60/40. Titrated levo down d/t HR, titrated neo up d/t BP. See Flowsheet for vital signs documentation.   Dr. [name] at beside and ordered to prepare for emergent cardioversion. APP ordered pt to be given 2mg  versed, 72mcg fent. Pt adequately sedated and cardioverted at 100J at 23:39.   Pt converted out of Afib RVR into NSR 100. Reviewed POC with APP. Plan to continue titrate up on neo gtt d/t SVR in 300's. Then titrate up on levo now that pt HR not limiting gtt titration.   Pt's wife already notified of decline in status and on the way to the hospital.   [name]

## 2019-08-21 NOTE — Progress Notes (Signed)
[place] Progress Note Patient Name: [name] DOB: 1955-12-28 MRN: [id number]   Date of Service  08/21/2019  HPI/Events of Note  SVT with ventricular rate = 162 and BP = 66/32 with MAP = 41. Recent K+ = 5.0 and Mg++ = 2.3. Patient with Hx of ESRD.  eICU Interventions  Will order: 1. Amiodarone IV load and infusion.  2. Titratee Phenylephrine as already ordered for MAP > 65. 3. Will ask ground team to evaluate patient at bedside.      Intervention Category Major Interventions: Arrhythmia - evaluation and management;Hypotension - evaluation and management  Lysle Dingwall 08/21/2019, 11:25 PM

## 2019-08-21 NOTE — Plan of Care (Signed)
Discussed with Dr. Lynetta Mare.  Pulm/critical care is placing vascath and would like to proceed with CRRT.  Keep even for now  Claudia Desanctis 08/21/2019 4:13 PM

## 2019-08-21 NOTE — Consult Note (Addendum)
Consultation Note Date: 08/21/2019   Patient Name: [name]  DOB: 1955-09-26  MRN: [id number]  Age / Sex: 64 y.o., male, male  PCP: Mikey Kirschner, MD Referring Physician: Kipp Brood, MD  Reason for Consultation: Establishing goals of care  HPI/Patient Profile: 64 y.o. malemale  with past medical history of hyperlipidemia, hypertension, recent diagnosis left lung bronchogenic carcinoma and recurrent malignant left pleural effusion, COVID-19 infection admitted on 08/01/2019 with SOB related to large left pleural effusion with mild midline shift and has already required multiple thoracentesis with PleurX placed 2/19. He deteriorated with hypotension and obtunded requiring intubation and underwent pericardial window 2/22 with evidence of fluid causing collapse of RA/RV. Extubated 2/23 but then deteriorated again 2/24 with re-intubation. Plans were for radiation therapy to begin today if he remained stable but he has continued to decline.   Clinical Assessment and Goals of Care: I have reviewed records and spoken with Elmyra Ricks, RN and Dr. Lynetta Mare. Family have gathered and have just met with Dr. Lynetta Mare regarding [name] situation. He has explained that overall prognosis is poor and has discussed options with family. I met with wife, son, daughter, at bedside and they are very overwhelmed at this time. I allow them time with [name] and to gather their thoughts and will follow up with them a little later today.   I returned to bedside and children have stepped out. 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 At this time she feels that she must give over control to God. She acknowledges that she  feels selfish but wants to keep her husband as long as she is able too. She would like to try dialysis and continue interventions. She understands he is critically ill and may not survive. She understandably does not want to be the one to make any decision that could lead to the end of his life. This family is faced with very difficult decisions but are supportive to each other and find solace in their faith. She feels that if he does not tolerate dialysis and if he further declines then this is God's will. With this we discussed DNR and she confirms that she doesn't feel he could be resuscitated with what is going on in his chest. This is aligned with the goals she has expressed. She received phone call from Mr. Lacasse sister and I left her in privacy to speak and told her I would pass along her wishes with the team.   All questions/concerns addressed. Emotional support provided. Updated Dr. Lynetta Mare and Elmyra Ricks, RN.   Primary Decision Maker NEXT OF KIN wife    SUMMARY OF RECOMMENDATIONS   - Proceed with dialysis - DNR decided and if arrests this would mean his body physically dies and family feel they would accept this as God's answer and decision  Code Status/Advance Care Planning:  DNR - continue otherwise aggressive care   Symptom Management:   Per PCCM  Palliative Prophylaxis:   Aspiration, Frequent Pain Assessment, Oral Care and Turn Reposition  Psycho-social/Spiritual:   Desire for further Chaplaincy support:yes  Prognosis:   Overall prognosis guarded with acute health deterioration and underlying newly diagnosed lung cancer.   Discharge Planning: To Be Determined      Primary Diagnoses: Present on Admission: . Pleural effusion on left . Recurrent pleural effusion on left   I have reviewed the medical record, interviewed the patient and family, and examined the patient. The following aspects are pertinent.  Past Medical History:  Diagnosis Date  . ED  (erectile dysfunction)   . Hyperlipidemia   . Hypertension   . Lung cancer [facility])    Social History   Socioeconomic History  . Marital status: Married    Spouse name: Not on file  . Number of children: Not on file  . Years of education: Not on file  . Highest education level: Not on file  Occupational History  . Not on file  Tobacco Use  . Smoking status: Current Some Day Smoker  . Smokeless tobacco: Never Used  Substance and Sexual Activity  . Alcohol use: Not on file  . Drug use: Not on file  . Sexual activity: Not on file  Other Topics Concern  . Not on file  Social History Narrative  . Not on file   Social Determinants of Health   Financial Resource Strain: Low Risk   . Difficulty of Paying Living Expenses: Not hard at all  Food Insecurity: No Food Insecurity  . Worried About Charity fundraiser in the Last Year: Never true  . Ran Out of Food in the Last Year: Never true  Transportation Needs: No Transportation Needs  . Lack of Transportation (Medical): No  . Lack of Transportation (Non-Medical): No  Physical Activity:   . Days of Exercise per Week: Not on file  . Minutes of Exercise per Session: Not on file  Stress: No Stress Concern Present  . Feeling of Stress : Not at all  Social Connections: Slightly Isolated  . Frequency of Communication with Friends and Family: Once a week  . Frequency of Social Gatherings with Friends and Family: Never  . Attends Religious Services: More than 4 times per year  . Active Member of Clubs or Organizations: Yes  . Attends Archivist Meetings: 1 to 4 times per year  . Marital Status: Married   History reviewed. No pertinent family history. Scheduled Meds: . ascorbic acid  500 mg Oral q morning - 10a  . aspirin  81 mg Oral Daily  . bupivacaine liposome  20 mL Infiltration Once  . chlorhexidine gluconate (MEDLINE KIT)  15 mL Mouth Rinse BID  . Chlorhexidine Gluconate Cloth  6 each Topical Daily  . colchicine  0.6  mg Oral BID  . latanoprost  1 drop Both Eyes QHS  . mouth rinse  15 mL Mouth Rinse BID  . midodrine  10 mg Oral TID WC  . multivitamin with minerals  1 tablet Oral q morning - 10a  . pravastatin  20 mg Oral QPM  . sennosides  5 mL Per Tube BID  . sodium chloride flush  10-40 mL Intracatheter Q12H   Continuous Infusions: . sodium chloride 100 mL/hr (08/20/19 1200)  . sodium chloride 250 mL (08/21/19 1038)  . cefTRIAXone (ROCEPHIN)  IV Stopped (08/20/19 1551)  .  dexmedetomidine (PRECEDEX) IV infusion Stopped (08/20/19 0846)  . fentaNYL infusion INTRAVENOUS 50 mcg/hr (08/21/19 1003)  . heparin 1,000 Units/hr (08/21/19 0600)  . norepinephrine (LEVOPHED) Adult infusion 15 mcg/min (08/21/19 0600)  . phenylephrine (NEO-SYNEPHRINE) Adult infusion Stopped (07/31/2019 1356)  . sodium chloride    . vasopressin (PITRESSIN) infusion - *FOR SHOCK* 0.04 Units/min (08/21/19 0905)   PRN Meds:.sodium chloride, acetaminophen **OR** acetaminophen, ondansetron **OR** ondansetron (ZOFRAN) IV, sodium chloride flush, traMADol No Known Allergies Review of Systems  Unable to perform ROS: Acuity of condition    Physical Exam  Sedated on vent HR tachycardic Breathing regular, unlabored and tolerating vent Abd soft Generalized edema, hands/feet cool to touch  Vital Signs: BP (!) 124/114   Pulse (!) 135   Temp 97.8 F (36.6 C) (Axillary)   Resp 16   Ht '6\' 1"'$  (1.854 m)   Wt 100.5 kg   SpO2 96%   BMI 29.23 kg/m  Pain Scale: 0-10   Pain Score: 0-No pain   SpO2: SpO2: 96 % O2 Device:SpO2: 96 % O2 Flow Rate: .O2 Flow Rate (L/min): 6 L/min  IO: Intake/output summary:   Intake/Output Summary (Last 24 hours) at 08/21/2019 1120 Last data filed at 08/21/2019 1000 Gross per 24 hour  Intake 3250.13 ml  Output 1965 ml  Net 1285.13 ml    LBM: Last BM Date: 08/18/19 Baseline Weight: Weight: 95.3 kg Most recent weight: Weight: 100.5 kg     Palliative Assessment/Data:     Time In/Out: 1100-1120,  1430-1500 Time Total: 50 min Greater than 50%  of this time was spent counseling and coordinating care related to the above assessment and plan.  Signed by: Vinie Sill, NP Palliative Medicine Team Pager # [phone] (M-F 8a-5p) Team Phone # [phone] (Nights/Weekends)

## 2019-08-21 NOTE — Progress Notes (Signed)
Upon entering patients room for shift report patient was noted to be cool, clammy and diaphoretic.  Paitent O2 sats 90% on room air.  Placed patient on 4L [place].  Dr Calton Dach called to bedside to evaluate patients status.  Decision made by MD to reintubate patient.  RT notified and to bedside.

## 2019-08-21 NOTE — Progress Notes (Signed)
[place] Progress Note Patient Name: [name] DOB: 20-Sep-1955 MRN: [id number]   Date of Service  08/21/2019  HPI/Events of Note  Mutiple issues: 1. ABG on 60%/PRVC 16/TV 630/P 5 = 7.259/47.3/137.0 and 2. Episode of SVT now resolved. Last K+ = 5.1 and patient on CRRT. BMP and PO4--- pending.   eICU Interventions  Will order: 1. Increase PRVC rate to 21. 2. Repeat ABG at 12 midnight.  3. Mg++ level STAT.     Intervention Category Major Interventions: Arrhythmia - evaluation and management;Acid-Base disturbance - evaluation and management;Respiratory failure - evaluation and management  Lysle Dingwall 08/21/2019, 10:33 PM

## 2019-08-21 NOTE — Progress Notes (Signed)
Chaplain provided support to family as they spoke with doctor about [name]'s options.  Chaplain prayed with wife [name] as their children went to see [name].  Chaplain affirmed the families need to process what the doctor had relayed.  [name] noted that she wanted God's will to be done.  [name] thanked chaplain for listening.   Chaplain will continue to follow-up.

## 2019-08-21 NOTE — Progress Notes (Signed)
[place] Progress Note Patient Name: RAVI TUCCILLO DOB: 1955-06-28 MRN: [id number]   Date of Service  08/21/2019  HPI/Events of Note  Multiple issues: 1. Unable to get L DP pulse. PT pulse OK. Capillary refill sluggish in L foot, 2. Agitation - Patient waking up and 3. No ABG since this AM. CVP = 8.  eICU Interventions  Will order: 1. Bolus with 0.9 NaCl 500 mL IV over 30 minutes. 2. Try to wean Norepinephrine IV infusion as tolerated.  3. Continue to monitor L foot pulses.  4. Fentanyl 50 mcg bolus from bag Q 2 hours PRN. 5. ABG STAT.     Intervention Category Major Interventions: Other:  Lysle Dingwall 08/21/2019, 10:12 PM

## 2019-08-21 NOTE — Progress Notes (Addendum)
[facility] Progress Note  Name: SHAWNA Villarreal MRN: [id number] DOB: [birth year]  Chief Complaint:  Shortness of breath   Subjective:  Had 385 mL UOP charted for 2/23.  He has been confused o/n per nursing.  Nursing reports that he is to be transferred to [facility] today for radiation.  He has been on LR at 100 ml/hr.  His CVP has risen from 10 to 14.  Extubated and on 4 liters at start of shift which has been weaned.  Tachypneic.  Has been on levo - 10 at start of shift- up to 26 peak per nursing and down to 15 now.  Also on vasopressin.  Review of systems:  Unable to obtain secondary to intubated ----------------- Background:  [name] is a 64 y.o. male with a history of stage IV lung cancer, tobacco abuse, and recent covid infection (diagonsed on 1/27 and assessed as being out of the infectious window) who presented to the hospital with shortness of breath.  The stage IV lung cancer is a relatively new diagnosis and has also had recurrent left pleural effusion which has required multiple thoracenteses.  He had a thoracenteses in the ER at [facility] with removal of 1.5 L of bloody fluid.  He has been transferred to [facility] and is in the ICU.   He had a Pleurx catheter placement on 2/9 by thoracic surgery.  His course was complicated by hypotension at that time.  Blood pressures charted as being as low as the 70s.  He received pressors and 3 doses of albumin.  Per pulmonology/critical care, they are trying to get the patient to [facility] for radiation if possible.  Note that his course has also been complicated by hypercalcemia.  Calcium has improved from 13.9 on admission.  He had 450 mL of urine over 2/20.  On arrival to unit, patient found to be acutely hypotensive 93'A systolic and below and obtunded and pulm was just notified of pericardial effusion.  Nursing applying pads to patient.  His wife would want for him to have CRRT when needed.     Intake/Output  Summary (Last 24 hours) at 08/21/2019 0551 Last data filed at 08/21/2019 0500 Gross per 24 hour  Intake 3355.72 ml  Output 2015 ml  Net 1340.72 ml    Vitals:  Vitals:   08/21/19 0200 08/21/19 0300 08/21/19 0400 08/21/19 0500  BP: (!) 151/98 (!) 160/72 (!) 124/59 (!) 94/54  Pulse: (!) 149 (!) 147 (!) 146 (!) 142  Resp: (!) 45 (!) 44 (!) 30 (!) 46  Temp:   98.6 F (37 C)   TempSrc:   Axillary   SpO2: 92% 92% 94% 92%  Weight:    100.5 kg  Height:         Physical Exam:  General adult male in bed critically ill   HEENT normocephalic atraumatic Neck supple trachea midline Lungs tachypneic. reduced breath sounds left; clearer on right Heart S1S2 tachycardic CVP 14 Abdomen softly dist  Extremities LUE with 2+edema; trace to 1+ LE edema; trace to 1+ RUE edema Neuro - off continuous sedation; knows name not year  GU has foley - minimal output  Medications reviewed     Labs:  BMP Latest Ref Rng & Units 08/21/2019 08/20/2019 08/01/2019  Glucose 70 - 99 mg/dL 133(H) 115(H) 134(H)  BUN 8 - 23 mg/dL 99(H) 88(H) 82(H)  Creatinine 0.61 - 1.24 mg/dL 3.88(H) 3.31(H) 3.27(H)  BUN/Creat Ratio 10 - 24 - - -  Sodium 135 - 145 mmol/L 143 139 141  Potassium 3.5 - 5.1 mmol/L 5.1 4.7 4.7  Chloride 98 - 111 mmol/L 107 104 103  CO2 22 - 32 mmol/L 17(L) 23 24  Calcium 8.9 - 10.3 mg/dL 10.4(H) 10.8(H) 11.9(H)     Assessment/Plan:   # Acute renal failure Likely secondary to ischemic and prerenal insults from setting of hypotension with pericardial effusion/tamponade as well as hypercalcemia.  Renal US with echogenic kidneys without hydro.  On lisinopril and HCTZ at home.  His wife would want for him to have RRT if/when needed.  UA neg protein and 0-5 RBC - No emergent indication for renal replacement therapy however anticipate need for CRRT in the next 24 hours.  This can be done at [facility] if he is to be transferred there to begin radiation - Stop LR  - Lasix 60 mg IV once now  - Will  discuss with pulm/critical care  # Shock cardiogenic  - s/p pericardial window - on levo and vasopressin   # pericardial effusion  - Per CTS; s/p window on 2/22  # Acute hypoxic respiratory failure - Per pulmonology - Discontinue IV fluids  # Hypercalcemia -Setting of malignancy.  Corrected calcium 11.5 on 2/24 - s/p calcitonin   # Stage IV lung cancer - Primary team has consulted onc and rad onc   # hx of Covid positive - Test was on 1/27.  Patient now out of infectious window per pulm   Claudia Desanctis, MD 08/21/2019 6:17 AM  Spoke with pulm.  They are intubating the patient.  Planning on transfer to [facility] today.  They will place vascath for [place].  Plan for CRRT.  Will discuss with [facility] team.    Claudia Desanctis  7:44 AM 08/21/2019

## 2019-08-21 NOTE — Progress Notes (Signed)
Our service is aware of the patient decompensating this morning, and are holding simulation for radiation until critical care gives permission.     Carola Rhine, PAC

## 2019-08-21 NOTE — Procedures (Signed)
Hemodialysis Catheter Insertion Procedure Note [name] [id number] 09/02/1955  Procedure: Insertion of Hemodialysis Catheter Indications: Dialysis Access   Procedure Details Consent: Risks of procedure as well as the alternatives and risks of each were explained to the (patient/caregiver).  Consent for procedure obtained. Time Out: Verified patient identification, verified procedure, site/side was marked, verified correct patient position, special equipment/implants available, medications/allergies/relevent history reviewed, required imaging and test results available.  Performed  Maximum sterile technique was used including antiseptics, cap, gloves, gown, hand hygiene, mask and sheet. Skin prep: Chlorhexidine; local anesthetic administered Triple lumen hemodialysis catheter was inserted into right internal jugular vein using the Seldinger technique. Ultrasound guidance.       Evaluation Blood flow good Complications: No apparent complications Patient did tolerate procedure well. Chest X-ray ordered to verify placement.  CXR: normal.   [name] 08/21/2019

## 2019-08-21 NOTE — Progress Notes (Signed)
NAME:  LAYNE DILAURO, MRN:  [id number], DOB:  May 17, 1956, LOS: 6 ADMISSION DATE:  08/08/2019, CONSULTATION DATE:  2/19 REFERRING MD:  Broadus John, CHIEF COMPLAINT:  Post-op hypotension    Brief History   Mr. Rendell is a 64 year old gentleman with a medical history significant for hyperlipidemia, Covid infection on 1/27 now out of infectious window, and tobacco abuse with recently diagnosed metastatic non-small cell lung cancer with recurrent left malignant pleural effusion.  Admitted due to his recurrent pleural effusions for Pleurx tube placement.  Remained hypotensive during procedure, CCM was then consulted on 2/19 to manage pressors.    History of present illness   64 year old AA gentleman admitted on 2/18 due to worsening shortness of breath and exertional dyspnea in the setting of recently diagnosed stage IV lung cancer with recurrent left pleural effusion.  He is already undergone multiple thoracentesis with rapid removal accumulation of fluid.  CXR showed complete opacification of his left thorax with CT chest showing large left pleural effusion with midline shift.  Underwent a thoracentesis in the ER at [facility], yielding a 1.5L bloody pleural fluid.  He was transferred to [facility] for thoracic surgery evaluation and Pleurx catheter placement.  He successfully had a Pleurx catheter placed on 2/19 by thoracic surgery.  They were able to remove 2L of pleural fluid, however intraoperatively he developed hypotension with systolics as low as the 27O.  He was given 3 doses of albumin and then started on low-dose phenylephrine with resultant stable blood pressures in the 90s.  CCM was consulted for postoperative hypotension management.  Past Medical History  History of tobacco abuse, hyperlipidemia, recently diagnosed with metastatic non-small cell lung cancer with recurrent left malignant pleural effusion  Significant Hospital Events   2/18: Thoracentesis in the ED, transferred to [facility] 2/19: Pleurx catheter placement and thoracentesis, started Neo-Synephrine gtt 2 2/22: Deteriorated, intubated.  Pericardial tamponade, pericardial window performed.  2/23: Extubated  2/24: Clinical deterioration, reintubated successfully  Consults:  Thoracic surgery 2/18 Critical care 2/19 2/22: medonc, radonc, nephrology, palliative care  Procedures:  Pleurx catheter placement 2/19 Pericardial window/TEE on 2/22 due to pericardial effusion with tamponade Intubation 2/22 and 2/24 (extubation 2/23)   Significant Diagnostic Tests:    Micro Data:  2/18 pleural fluid>>>neg 2/18 Gram stain pleural fluid>>>neg  Antimicrobials:  Ancef x1 Intra-Op Ceftriaxone IV 2/23-   Interim history/subjective:  Over the course of yesterday afternoon and overnight, he has remained persistently hypotensive and tachycardic requiring further titration of NE and initiation of vasopressin.  Started on ceftriaxone yesterday.  This morning he was increasingly diaphoretic, clammy, and tachypneic with almost paroxysmal breathing.  Due to his worsening clinical status, he was reintubated and tolerated well.  724ml out of pleur-x catheter in the past 24 hours, 250 this morning.   Objective   Blood pressure 103/84, pulse (!) 138, temperature 98.6 F (37 C), temperature source Axillary, resp. rate (!) 34, height 6\' 1"  (1.854 m), weight 100.5 kg, SpO2 92 %. CVP:  [0 mmHg-27 mmHg] 15 mmHg      Intake/Output Summary (Last 24 hours) at 08/21/2019 0718 Last data filed at 08/21/2019 0600 Gross per 24 hour  Intake 3406.42 ml  Output 1765 ml  Net 1641.42 ml   Filed Weights   08/07/2019 0338 08/20/19 0500 08/21/19 0500  Weight: 102.5 kg 100.2 kg 100.5 kg    Examination: General: Middle-aged gentleman, appears ill and diaphoretic Cardiac: Tachycardic, regular rhythm Pulmonary: Tachypneic in the 40s with accessory muscle use,  on 6L [place].  Diminished breath sounds in the left throughout, clear with scattered  rhonchi on the right.  Pleurx catheter and chest tube in place with serosanguineous drainage. GI: Soft Extremities: Left arm swelling, no lower extremity edema.  Bilateral lower extremities cool to touch. Neuro/psych: Lethargic, responds to voice however unable to answer questions appropriately.  EOMI.    Resolved Hospital Problem list    Assessment & Plan:   Goals of care Currently critically ill and in shock requiring increasing titration of two vasopressors with renal failure potentially needing CRRT, all in the setting of stage IV lung cancer with left mainstem obstruction. Full code. Poor prognosis regardless. Dr. Lynetta Mare initiated discussion of goals of care with wife at bedside including desire to proceed with aggressive care including CRRT, pressor support, palliative radiotherapy etc. At this point she would like to proceed with current care, however would like to discuss further with her children.  We have also consulted palliative care to also help facilitate discussion.   Shock Reintubated this morning, 2/24, due to worsening clinical status.  Diaphoretic and clammy. Persistently tachycardic, hypotensive which was non-responsive to IVF and requiring further titration of NE and addition of vasopressin  Multifactorial, suspect SIRS related to surgery/tumor, recent cardiac tamponade s/p pericardial window on 2/22, and obstructive left lung mass with likely postobstructive pneumonia developing. Plan: -Levophed and vasopressin gtt, titrate as needed  -Midodrine 10mg  TID  -Continue ceftriaxone (2/23-)  -Intubated, fentanyl gtt for sedation  -Chest PT -Palliative care consulted as above  -Pending family discussions, could consider repeat CT chest +/- bronchoscopy to better understand locations of obstruction/clear secretions   Stage IV non-small cell lung cancer of LUL with left mainstem obstruction and recurrent left pleural effusion Widespread metastatic disease.  Proceeding with  palliative radiation, poor candidate for concurrent chemotherapy per medical oncology. Plan: -Potential transfer to [facility] for radiotherapy, to discuss with family further  -Awaiting brain MRI to evaluate for mets  Acute renal failure, worsening  Cr 3.31>3.88 today, BL 1.0.  Felt to be ischemic ATN/prerenal injury.  Nephrology anticipating need for CRRT in the next 24 hours. Plan: -Appreciate assistance per nephrology, CRRT can be done at [facility] if needed -Lasix 60 mg x1  -Manage hypotension as above  -Follow-up urine P/C ratio -BMP in am   Recurrent left pleural effusion, in the setting of above S/p Pleurx catheter drain placement on 2/19. 250 ml output this morning.  Plan: -Intermittent pleural drainage --Maintain pleurx in place for now   Large pericardial effusion, presumed malignant, with tamponade physiology S/p pericardial window by CTS on 2/22. Plan: -Drain in place per CTS  Acute LUE DVT with associated swelling  Within left subclavian, axillary, and brachial veins.  Plan: -IV heparin started on 2/22  Malignant hypercalcemia, improving  Corrected ca 11.5.  Received calcitonin and Zometa on 2/19. Plan: -Monitor calcium level -Can consider repeating Zometa on 2/26 if increasing  COVID positive 1/27 Out of infectious window.  Leukocytosis WBC 16.8>22.  May be reactive due to critical illness, however currently covering for postobstructive pneumonia with ceftriaxone as discussed above.   Disposition: Working towards stabilization for now. Further family [place] conversation. Holding on [place] long transfer for today, 2/24.    Patriciaann Clan, DO  Family Medicine PGY-2

## 2019-08-21 NOTE — Procedures (Signed)
Intubation Procedure Note  [name]  [id number] 1956-03-28   Procedure: Intubation Indications: Respiratory insufficiency  Procedure Details Consent: Unable to obtain consent because of altered level of consciousness. Time Out: Verified patient identification, verified procedure, site/side was marked, verified correct patient position, special equipment/implants available, medications/allergies/relevent history reviewed, required imaging and test results available.  Performed  Pre-oxygenation: 100% via Bag valve mask  Position: supine Premedication/Induction agent: Fentanyl 100mg , etomidate 20mg , and Versed 2mg   Paralytic: Rocuronium 100mg   Technique: Video laryngoscope  Tube size: 7.5 ETT Number of attempts: 1 Insertion depth: 24 cm  Tube secured: tube holder Other findings: significant secretions   Evaluation Colorimetric change: Yes Bilateral breath sounds: Yes, but already significant decrease on the left d/t mass  Hemodynamic Status: BP stable throughout; O2 sats: stable throughout Patient's Current Condition: unstable Complications: No apparent complications Patient did tolerate procedure well. Chest X-ray ordered to verify placement.  CXR: tube position acceptable.  Direct supervision by Dr. Lynetta Mare.   Darrelyn Hillock, DO  Family Medicine PGY-2 08/21/2019

## 2019-08-21 NOTE — Progress Notes (Addendum)
PCCM Interval Progress Note  Asked to see pt at bedside for SVT vs AF RVR with HR 160's and MAP low 40's.  Rhythm primarily seems to be SVT. Occasionally has irregularity as if were A.fib.  No underlying hx of A.fib and per RN, has not been in A.fib while here. Previously had HR low 100's with MAP high 50's. SVR on FloTrac 600.  Amio bolus and infusion already ordered by [facility].  Cardioverted x 1 at 100J.  HR improved to low 100's.  MAP temporarily jumped to high 50's but shortly after returned back to high 40's. Neo increased from 200 to 400 given low SVR and MAP's (MAP already slowly improving). Titrate levo up as needed. Continue vaso. ABG pending and ELINK to follow.  Additional CC time: 20 min.   [name], [facility] 08/21/2019, 11:53 PM

## 2019-08-22 ENCOUNTER — Other Ambulatory Visit: Payer: Self-pay

## 2019-08-22 DIAGNOSIS — Z515 Encounter for palliative care: Secondary | ICD-10-CM

## 2019-08-22 LAB — CBC WITH DIFFERENTIAL/PLATELET
Abs Immature Granulocytes: 0.22 10*3/uL — ABNORMAL HIGH (ref 0.00–0.07)
Basophils Absolute: 0 10*3/uL (ref 0.0–0.1)
Basophils Relative: 0 %
Eosinophils Absolute: 0 10*3/uL (ref 0.0–0.5)
Eosinophils Relative: 0 %
HCT: 44.1 % (ref 39.0–52.0)
Hemoglobin: 14.2 g/dL (ref 13.0–17.0)
Immature Granulocytes: 2 %
Lymphocytes Relative: 2 %
Lymphs Abs: 0.3 10*3/uL — ABNORMAL LOW (ref 0.7–4.0)
MCH: 30.2 pg (ref 26.0–34.0)
MCHC: 32.2 g/dL (ref 30.0–36.0)
MCV: 93.8 fL (ref 80.0–100.0)
Monocytes Absolute: 0.9 10*3/uL (ref 0.1–1.0)
Monocytes Relative: 7 %
Neutro Abs: 10.3 10*3/uL — ABNORMAL HIGH (ref 1.7–7.7)
Neutrophils Relative %: 89 %
Platelets: 134 10*3/uL — ABNORMAL LOW (ref 150–400)
RBC: 4.7 MIL/uL (ref 4.22–5.81)
RDW: 16.1 % — ABNORMAL HIGH (ref 11.5–15.5)
WBC: 11.7 10*3/uL — ABNORMAL HIGH (ref 4.0–10.5)
nRBC: 0.6 % — ABNORMAL HIGH (ref 0.0–0.2)

## 2019-08-22 LAB — POCT I-STAT 7, (LYTES, BLD GAS, ICA,H+H)
Acid-base deficit: 9 mmol/L — ABNORMAL HIGH (ref 0.0–2.0)
Bicarbonate: 18.1 mmol/L — ABNORMAL LOW (ref 20.0–28.0)
Calcium, Ion: 1.22 mmol/L (ref 1.15–1.40)
HCT: 40 % (ref 39.0–52.0)
Hemoglobin: 13.6 g/dL (ref 13.0–17.0)
O2 Saturation: 99 %
Patient temperature: 35.3
Potassium: 5.4 mmol/L — ABNORMAL HIGH (ref 3.5–5.1)
Sodium: 137 mmol/L (ref 135–145)
TCO2: 19 mmol/L — ABNORMAL LOW (ref 22–32)
pCO2 arterial: 38.9 mmHg (ref 32.0–48.0)
pH, Arterial: 7.267 — ABNORMAL LOW (ref 7.350–7.450)
pO2, Arterial: 184 mmHg — ABNORMAL HIGH (ref 83.0–108.0)

## 2019-08-22 LAB — BASIC METABOLIC PANEL
Anion gap: 20 — ABNORMAL HIGH (ref 5–15)
BUN: 63 mg/dL — ABNORMAL HIGH (ref 8–23)
CO2: 14 mmol/L — ABNORMAL LOW (ref 22–32)
Calcium: 8.4 mg/dL — ABNORMAL LOW (ref 8.9–10.3)
Chloride: 106 mmol/L (ref 98–111)
Creatinine, Ser: 2.71 mg/dL — ABNORMAL HIGH (ref 0.61–1.24)
GFR calc Af Amer: 28 mL/min — ABNORMAL LOW (ref >60)
GFR calc non Af Amer: 24 mL/min — ABNORMAL LOW (ref >60)
Glucose, Bld: 109 mg/dL — ABNORMAL HIGH (ref 70–99)
Potassium: 5.3 mmol/L — ABNORMAL HIGH (ref 3.5–5.1)
Sodium: 140 mmol/L (ref 135–145)

## 2019-08-22 LAB — GLUCOSE, CAPILLARY
Glucose-Capillary: 139 mg/dL — ABNORMAL HIGH (ref 70–99)
Glucose-Capillary: 76 mg/dL (ref 70–99)
Glucose-Capillary: 95 mg/dL (ref 70–99)

## 2019-08-22 LAB — HEPARIN LEVEL (UNFRACTIONATED): Heparin Unfractionated: 0.38 IU/mL (ref 0.30–0.70)

## 2019-08-22 LAB — PHOSPHORUS: Phosphorus: 4.6 mg/dL (ref 2.5–4.6)

## 2019-08-22 LAB — MAGNESIUM: Magnesium: 2.6 mg/dL — ABNORMAL HIGH (ref 1.7–2.4)

## 2019-08-22 MED ORDER — ACETAMINOPHEN 325 MG PO TABS: 650.0000 mg | ORAL_TABLET | Freq: Four times a day (QID) | ORAL | Status: DC | PRN

## 2019-08-22 MED ORDER — DEXTROSE 5 % IV SOLN: INTRAVENOUS | Status: DC

## 2019-08-22 MED ORDER — MIDAZOLAM HCL 2 MG/2ML IJ SOLN: 2.0000 mg | Freq: Once | INTRAMUSCULAR | Status: AC

## 2019-08-22 MED ORDER — FENTANYL BOLUS VIA INFUSION: 50.0000 ug | INTRAVENOUS | Status: DC | PRN

## 2019-08-22 MED ORDER — STERILE WATER FOR INJECTION IV SOLN
INTRAVENOUS | Status: DC
  Filled 2019-08-22: qty 850

## 2019-08-22 MED ORDER — POLYVINYL ALCOHOL 1.4 % OP SOLN
1.0000 [drp] | Freq: Four times a day (QID) | OPHTHALMIC | Status: DC | PRN
  Filled 2019-08-22: qty 15

## 2019-08-22 MED ORDER — GLYCOPYRROLATE 0.2 MG/ML IJ SOLN: 0.2000 mg | INTRAMUSCULAR | Status: DC | PRN

## 2019-08-22 MED ORDER — SODIUM BICARBONATE 8.4 % IV SOLN
INTRAVENOUS | Status: AC
  Administered 2019-08-22: 01:00:00 100 meq via INTRAVENOUS
  Filled 2019-08-22: qty 50

## 2019-08-22 MED ORDER — MIDAZOLAM HCL 2 MG/2ML IJ SOLN
2.0000 mg | INTRAMUSCULAR | Status: DC | PRN
  Administered 2019-08-22: 2 mg via INTRAVENOUS
  Filled 2019-08-22: qty 4

## 2019-08-22 MED ORDER — GLYCOPYRROLATE 1 MG PO TABS
1.0000 mg | ORAL_TABLET | ORAL | Status: DC | PRN
  Filled 2019-08-22: qty 1

## 2019-08-22 MED ORDER — DIPHENHYDRAMINE HCL 50 MG/ML IJ SOLN: 25.0000 mg | INTRAMUSCULAR | Status: DC | PRN

## 2019-08-22 MED ORDER — FENTANYL BOLUS VIA INFUSION
50.0000 ug | INTRAVENOUS | Status: DC | PRN
  Administered 2019-08-22: 12:00:00 150 ug via INTRAVENOUS
  Filled 2019-08-22: qty 200

## 2019-08-22 MED ORDER — ACETAMINOPHEN 650 MG RE SUPP: 650.0000 mg | Freq: Four times a day (QID) | RECTAL | Status: DC | PRN

## 2019-08-22 MED ORDER — SODIUM BICARBONATE 8.4 % IV SOLN
100.0000 meq | Freq: Once | INTRAVENOUS | Status: AC
  Filled 2019-08-22: qty 50

## 2019-08-26 NOTE — Progress Notes (Signed)
MD made aware of current blood pressure and gtt rates. CRRT just filtering, not pulling or keeping even. Wife at bedside.

## 2019-08-26 NOTE — Progress Notes (Signed)
I responded to a call from the nurse to provide spiritual support for the patient's wife, [name]. I arrived at the patient's room where his wife was at bedside with his nurse and doctor. I provided spiritual care and support by sharing words of encouragement, by reading scripture, and by leading in prayer. I remained present as the doctor provided an update to [place] on her husband's current medical condition to provide spiritual support as needed or requested.    08-29-19 0100  Clinical Encounter Type  Visited With Patient and family together  Visit Type Spiritual support  Referral From Nurse  Consult/Referral To Chaplain  Spiritual Encounters  Spiritual Needs Prayer;Emotional    Chaplain Dr Redgie Grayer

## 2019-08-26 NOTE — Progress Notes (Signed)
ANTICOAGULATION CONSULT NOTE  Pharmacy Consult for heparin  Indication: DVT  No Known Allergies  Patient Measurements: Height: 6\' 1"  (185.4 cm) Weight: (transitioning to comfort care) IBW/kg (Calculated) : 79.9 Heparin Dosing Weight: 100.8  Vital Signs: Temp: 90 F (32.2 C) (02/25 1000)  Labs: Recent Labs    08/20/19 0316 08/20/19 0316 08/20/19 1425 08/20/19 2235 08/21/19 0327 08/21/19 1008 08/21/19 1020 08/21/19 2218 08/21/19 2224 08/21/19 2224 09/18/19 0005 2019/09/18 0554 09/18/2019 1114  HGB 14.7   < >  --   --  14.9  --    < >  --  14.6   < > 13.6 14.2  --   HCT 44.8   < >  --   --  45.8  --    < >  --  43.0  --  40.0 44.1  --   PLT 141*  --   --   --  208  --   --   --   --   --   --  134*  --   LABPROT 17.1*  --   --   --   --   --   --   --   --   --   --   --   --   INR 1.4*  --   --   --   --   --   --   --   --   --   --   --   --   HEPARINUNFRC 0.93*  --    < > 0.74*  --  0.46  --   --   --   --   --   --  0.38  CREATININE 3.31*   < >  --   --  3.88*  --   --  3.14*  --   --   --  2.71*  --    < > = values in this interval not displayed.    Estimated Creatinine Clearance: 35.7 mL/min (A) (by C-G formula based on SCr of 2.71 mg/dL (H)).   Assessment: Patient with stage 4 lung cancer with a pleural drainage catheter for recurrent pleural effusions. Vascular ultrasound of left upper extremity positive for new DVT and pharmacy dosing heparin. Possible transition to comfort care -heparin level at goal  Goal of Therapy:  Heparin level 0.3-0.7 units/ml Monitor platelets by anticoagulation protocol: Yes   Plan:  Continue heparin at  1000 units/hr Daily heparin level and CBC  Hildred Laser, PharmD Clinical Pharmacist **Pharmacist phone directory can now be found on [email or web address] ([email or web address]).  Listed under [place].

## 2019-08-26 NOTE — Progress Notes (Signed)
Chaplain engaged in follow-up visit with [name] and his family.  Chaplain gave [name] clarity around [employer].  Notaries that chaplain checked with were only willing to notarize healthcare POA.  Chaplain provided options to [facility] and to nurse.  Chaplain did a check-in with family.  Family is leaning on each other for strength. Chaplain offered support.    Chaplain will continue to follow-up.

## 2019-08-26 NOTE — Progress Notes (Signed)
Comfort measures started for patient. CRRT stopped.  158mcg bolus of fentanyl and 2 mg of versed given to patient for comfort.   Patient placed on CPAP and comfortable -- patient extubated. All vasoactive medications stopped.   Family at bedside with patient as he passes away in peace.

## 2019-08-26 NOTE — Death Summary Note (Signed)
DEATH SUMMARY   Patient Details  Name: [name] MRN: [id number] DOB: 10/09/1955  Admission/Discharge Information   Admit Date:  08-20-2019  Date of Death: Date of Death: August 27, 2019  Time of Death: Time of Death: 09-09-1218  Length of Stay: 7  Referring Physician: Mikey Kirschner, MD   Reason(s) for Hospitalization  Recurrent left malignant pleural effusion, in the setting of stage IV lung cancer  Diagnoses  Diagnoses (including complications and co-morbidities):  Active Problems:   Recurrent pleural effusion on left, malignant   Acute respiratory failure (HCC)   Cardiogenic shock (HCC)   Metastatic stage IV lung cancer (metastasis from lung to other site), left (HCC)   Hypercalcemia of malignancy   Moderate pericardial effusion with cardiac tamponade   Pressure injury of skin   Septic shock (HCC)   Acute renal failure, requiring CRRT   Metabolic encephalopathy  Brief Hospital Course (including significant findings, care, treatment, and services provided and events leading to death)  [name] is a 64 y.o. year old male who was admitted on 2022/08/19 due to worsening shortness of breath and exertional dyspnea in the setting of recently diagnosed stage IV lung cancer with left mainstem obstruction and recurrent left pleural effusion.  Underwent a thoracentesis in the ED and then subsequent Pleurx catheter placement by thoracic surgery on 2/19.  Following pleural fluid removal, intraoperatively became hypotensive requiring pressor support.  Further decompensated through 2/22 requiring intubation and found to have a large pericardial effusion, presumed malignant, with cardiac tamponade physiology. Underwent an urgent pericardial window on 2/22 by CVTS.  Initially had plans to transfer to [facility] to start palliative radiotherapy (was not felt to be a candidate for chemo through medical oncology), however with continued deterioration this was postponed.  He remained critically ill  due to distributive shock/respiratory distress and hypotensive despite ultimately titrating to maximum support of VE, NE, and Neo by Aug 26, 2022.  Bronchoscopy was performed on 2/24 to assess for any reversible secretions, however only found significant airway compression due to his large lung mass.  Additionally during this time, developed acute renal failure, initiated CRRT on 2/24, however became increasingly hypotensive and arrhythmic (SVT/afib with RVR) leading to emergent cardioversion.  Throughout his hospital stay, discussions were held with the family regarding their goals of care given continued significant deterioration in his condition.  Family initially opted for aggressive medical therapy, however as he continued to deteriorate despite maximum aggressive measures, they transitioned him to comfort care around 11:30 AM on 2022-08-26.  He passed away peacefully with his family at bedside at 12:20 PM.   Pertinent Labs and Studies  Significant Diagnostic Studies DG Chest 1 View  Result Date: 07/31/2019 CLINICAL DATA:  Central line placement. EXAM: CHEST  1 VIEW COMPARISON:  Radiograph earlier this day. Chest CT 07/12/2019 FINDINGS: Endotracheal tube tip at the thoracic inlet 5.4 cm from the carina. Enteric tube in place with tip below the diaphragm not included in the field of view. Left internal jugular central venous catheter tip in the upper SVC. Left chest tube in place. Additional catheter tubing projects over the lower left chest/upper abdomen, may be external to the patient or an additional drain. Decreasing left pleural effusion with small residual. No visualized pneumothorax. Rounded left midlung opacity corresponding to known lung mass. Pulmonary nodules in the right lung on prior CT are not well visualized radiographically. Unchanged heart size and mediastinal contours. IMPRESSION: 1. Tip of the left internal jugular central venous catheter in the upper  SVC. 2. Endotracheal tube in place. Enteric tube  tip below the diaphragm not included in the field of view. 3. Decreasing left pleural effusion with small residual. No visualized pneumothorax. 4. Left midlung opacity corresponding to known lung mass. Electronically Signed   By: Keith Rake M.D.   On: 08/14/2019 17:09   US RENAL  Result Date: 08/17/2019 CLINICAL DATA:  Acute kidney injury.  Inpatient. EXAM: RENAL / URINARY TRACT ULTRASOUND COMPLETE COMPARISON:  07/22/2019 PET-CT. FINDINGS: Right Kidney: Renal measurements: 11.7 x 6.1 x 6.7 cm = volume: 249 mL. Echogenic right renal parenchyma, normal thickness. No hydronephrosis. No renal mass. Left Kidney: Renal measurements: 11.8 x 7.4 x 8.5 cm = volume: 388 mL. Echogenic left renal parenchyma, normal thickness. No hydronephrosis. No renal mass. Bladder: Nonspecific mild diffuse bladder wall thickening. No focal bladder abnormality demonstrated. Other: None. IMPRESSION: 1. No hydronephrosis. 2. Echogenic normal size kidneys, compatible with reported history of nonspecific acute renal parenchymal disease. 3. Nonspecific mild diffuse bladder wall thickening. Suggest correlation with urinalysis. Electronically Signed   By: Ilona Sorrel M.D.   On: 08/17/2019 08:42   CT Biopsy  Result Date: 07/24/2019 INDICATION: 64 year old male with a history metastatic disease, likely lung carcinoma. He has been referred for biopsy of left anterior third rib lesion EXAM: CT BIOPSY MEDICATIONS: None. ANESTHESIA/SEDATION: Moderate (conscious) sedation was employed during this procedure. A total of Versed 2.0 mg and Fentanyl 100 mcg was administered intravenously. Moderate Sedation Time: 15 minutes. The patient's level of consciousness and vital signs were monitored continuously by radiology nursing throughout the procedure under my direct supervision. FLUOROSCOPY TIME:  CT COMPLICATIONS: None PROCEDURE: The procedure, risks, benefits, and alternatives were explained to the patient and the patient's family. Specific risks  that were addressed included bleeding, infection, pneumothorax, need for further procedure including chest tube placement, chance of delayed pneumothorax or hemorrhage, hemoptysis, nondiagnostic sample, cardiopulmonary collapse, death. Questions regarding the procedure were encouraged and answered. The patient understands and consents to the procedure. Patient was positioned in the supine position on the CT gantry table and a scout CT of the chest was performed for planning purposes. Once angle of approach was determined, the skin and subcutaneous tissues this scan was prepped and draped in the usual sterile fashion, and a sterile drape was applied covering the operative field. A sterile gown and sterile gloves were used for the procedure. Local anesthesia was provided with 1% Lidocaine. The skin and subcutaneous tissues were infiltrated 1% lidocaine for local anesthesia, and a small stab incision was made with an 11 blade scalpel. Using CT guidance, a 17 gauge trocar needle was advanced into the left anterior third ribtarget. After confirmation of the tip, separate 18 gauge core biopsies were performed. These were placed into solution for transportation to the lab. Final CT was performed. Patient tolerated the procedure well and remained hemodynamically stable throughout. No complications were encountered and no significant blood loss was encounter IMPRESSION: Status post CT-guided biopsy of left anterior third rib lesion. Tissue specimen sent to pathology for complete histopathologic analysis. Signed, Dulcy Fanny. Dellia Nims, RPVI [facility] Electronically Signed   By: Corrie Mckusick D.O.   On: 07/24/2019 11:20   DG CHEST PORT 1 VIEW  Result Date: 08/21/2019 CLINICAL DATA:  Dialysis catheter placement. EXAM: PORTABLE CHEST 1 VIEW COMPARISON:  Earlier same day FINDINGS: 6:30 p.m. Low volume film with similar appearance of left pneumothorax and consolidative  opacity in the left mid and lower lung.  Left chest tube remains in place. Endotracheal tube tip is 6.4 cm above the base of the carina. The NG tube passes into the stomach although the distal tip position is not included on the film. Left IJ central line tip overlies the proximal SVC. Right IJ central line is new in the interval with tip overlying the proximal SVC. No right-sided pneumothorax. Subcutaneous emphysema noted left chest wall, new in the interval but compatible with the presence of a chest tube. IMPRESSION: New right IJ central line tip overlies the proximal SVC. No evidence for right-sided pneumothorax. Stable small left-sided pneumothorax with left chest tube in situ. Electronically Signed   By: Misty Stanley M.D.   On: 08/21/2019 19:02   DG CHEST PORT 1 VIEW  Result Date: 08/21/2019 CLINICAL DATA:  Respiratory failure EXAM: PORTABLE CHEST 1 VIEW COMPARISON:  08/20/2019 FINDINGS: Endotracheal tube terminates approximately 5.8 cm superior to the carina. Enteric tube courses below the diaphragm with distal tip and side hole beyond the inferior margin of the film. Left IJ central venous catheter terminates at the level of the SVC, unchanged. Left-sided thoracostomy tube is unchanged in positioning. Stable cardiomediastinal contours. Large mass within the left mid lung field. Similar prominent interstitial markings within the left upper lobe. Grossly stable nodular densities within the right lung. Question trace left apical pneumothorax. IMPRESSION: 1. Question trace left apical pneumothorax. 2. Otherwise stable chest radiograph. Support lines and tubes as above. These results will be called to the ordering clinician or representative by the Radiologist Assistant, and communication documented in the PACS or zVision Dashboard. Electronically Signed   By: Davina Poke D.O.   On: 08/21/2019 09:11   DG Chest Port 1 View  Result Date: 08/20/2019 CLINICAL DATA:  Follow-up ET tube and chest tube.  EXAM: PORTABLE CHEST 1 VIEW COMPARISON:  08/10/2019. FINDINGS: The endotracheal tube tip is approximately 6 cm above the carina. There is a left IJ catheter with tip in the SVC. NG tube in place with side port below the GE junction. Left-sided chest tube is stable in position. No pneumothorax identified. Left pleural effusion scratch set small residual left pleural effusion noted on today's exam. Large left midlung mass is again noted. Smaller lung nodules including 1.4 cm right upper lobe lung nodule are stable. IMPRESSION: 1. Stable position of left chest tube without pneumothorax. Compared with 08/17/2019 there has been considerable decrease in volume of left pleural effusion with improved aeration of the left lung. Electronically Signed   By: Kerby Moors M.D.   On: 08/20/2019 09:13   DG CHEST PORT 1 VIEW  Result Date: 08/21/2019 CLINICAL DATA:  Central line placement. EXAM: PORTABLE CHEST 1 VIEW COMPARISON:  Chest x-ray from yesterday. FINDINGS: New endotracheal tube with tip at the thoracic inlet, 8.4 cm above the carina. New enteric tube with tip in the mid esophagus. New left internal jugular central venous catheter with tip in the proximal SVC. Unchanged left-sided chest tube. Stable cardiomediastinal silhouette. Decreasing left hydropneumothorax with slightly improved aeration of the left lung. Unchanged large left upper lobe lung mass and small right upper lobe lung nodule. No acute osseous abnormality. IMPRESSION: 1. Endotracheal tube tip at the thoracic inlet. Recommend advancement by 3 cm. 2. Enteric tube tip in the mid esophagus.  Recommend advancement. 3. Left internal jugular central venous catheter without complicating feature. 4. Decreasing left hydropneumothorax with slightly improved aeration of the left lung. 5. Unchanged large left upper lobe lung mass and small right upper lobe lung nodule.  Electronically Signed   By: Titus Dubin M.D.   On: 08/14/2019 15:11   DG Chest Port 1  View  Result Date: 08/18/2019 CLINICAL DATA:  Follow-up pleural effusion EXAM: PORTABLE CHEST 1 VIEW COMPARISON:  Chest radiograph from one day prior. FINDINGS: Left apical chest tube in place. Stable cardiomediastinal silhouette with normal heart size. No right pneumothorax. No significant right pleural effusion. Volume loss in the left hemithorax is similar. Left hydropneumothorax is overall similar in size with decreased pleural component and increased pneumothorax component. Upper left parahilar masslike opacity is newly silhouetted. Stable apical right lung nodule. IMPRESSION: 1. Stable apical left chest tube position. Left hydropneumothorax is similar in size with decreased pleural component and increased pneumothorax component. Overall volume loss in the left hemithorax is similar. 2. Newly silhouetted left upper parahilar masslike opacity, compatible with known neoplasm. 3. Stable apical right lung nodule. Electronically Signed   By: Ilona Sorrel M.D.   On: 08/18/2019 06:58   DG Chest Port 1 View  Result Date: 08/17/2019 CLINICAL DATA:  Lung cancer, pleural effusion EXAM: PORTABLE CHEST 1 VIEW COMPARISON:  Chest radiograph from one day prior. FINDINGS: Complete opacification of the left hemithorax is unchanged. Stable left apical chest tube position. No pneumothorax. No right pleural effusion. Vague focal nodular opacity in the upper right lung is stable. Stable obscured cardiomediastinal silhouette. IMPRESSION: 1. Stable chest radiograph with complete opacification of the left hemithorax and left apical chest tube in place. No pneumothorax. 2. Stable focal nodular opacity in the upper right lung. Electronically Signed   By: Ilona Sorrel M.D.   On: 08/17/2019 05:25   DG Chest Port 1 View  Result Date: 08/13/2019 CLINICAL DATA:  Follow-up chest tube. EXAM: PORTABLE CHEST 1 VIEW COMPARISON:  08/14/2019 FINDINGS: Interval placement of left chest tube. Complete opacification of the left hemithorax is  unchanged. This reflects large left pleural effusion, the large left upper lobe lung mass and postobstructive changes. Similar appearance of right upper lobe lung nodule. IMPRESSION: 1. Interval placement of left chest tube. No change in complete opacification of the left hemithorax. 2. Stable right upper lobe lung nodule. Electronically Signed   By: Kerby Moors M.D.   On: 08/23/2019 12:35   DG Chest Port 1 View  Result Date: 08/18/2019 CLINICAL DATA:  Shortness of breath.  Lung carcinoma EXAM: PORTABLE CHEST 1 VIEW COMPARISON:  August 08, 2019 FINDINGS: There remains essentially complete opacification of the left hemithorax with shift of heart and mediastinum toward the right. Right lung remains clear. Heart size is grossly normal. No adenopathy evident on the right. Note that the left hilar and mediastinal regions are obscured by the opacification. No bone lesions. IMPRESSION: Complete opacification of the left hemithorax with shift of heart and mediastinum toward the right. Suspect large pleural effusion on the left. There may well be underlying mass and/or consolidation on the left. Right lung clear. Grossly stable cardiac silhouette. Electronically Signed   By: Lowella Grip III M.D.   On: 08/13/2019 09:53   DG Chest Port 1 View  Result Date: 08/08/2019 CLINICAL DATA:  Post LEFT thoracentesis, removal 1.2 L of fluid, metastatic lung cancer EXAM: PORTABLE CHEST 1 VIEW COMPARISON:  Portable exam at 1105 hrs compared to 08/07/2019 FINDINGS: Persistent complete opacification of the LEFT hemithorax despite pain at removal of 1.2 L of fluid. Slight mediastinal shift to the RIGHT. RIGHT lung clear. No pneumothorax. Osseous structures unremarkable. IMPRESSION: No pneumothorax following LEFT thoracentesis. Persistent diffuse opacification of the LEFT  hemithorax despite thoracentesis, representing a combination of tumor, atelectatic lung and residual effusion. Electronically Signed   By: Lavonia Dana M.D.    On: 08/08/2019 11:46   DG Chest Port 1 View  Result Date: 08/07/2019 CLINICAL DATA:  The patient states increasing SOB for a few days. Patient has lung cancer and had a thoracentesis on 07/24/2019. Patient positive for Covid-19 on 07/24/2019. Hx of HTN and current smoker. EXAM: PORTABLE CHEST 1 VIEW COMPARISON:  Chest radiograph 07/23/2018 FINDINGS: Visualized portions of the cardiomediastinal contours appear stable. Redemonstrated diffuse opacification of the left hemithorax with rightward mediastinal shift. The right lung appears clear. No right pleural effusion. No large pneumothorax. No acute finding in the visualized skeleton. IMPRESSION: Redemonstrated diffuse opacification of the left hemithorax with associated rightward mediastinal shift. Electronically Signed   By: Audie Pinto M.D.   On: 08/07/2019 20:31   DG Chest Port 1 View  Result Date: 07/24/2019 CLINICAL DATA:  Post thoracentesis on the left today. Metastatic lung cancer. COVID-19 infection. EXAM: PORTABLE CHEST 1 VIEW COMPARISON:  Radiographs 07/02/2019, CT 07/12/2019 and PET-CT 07/22/2019. FINDINGS: 1344 hours. Two views obtained. There is near complete whiteout of the left hemithorax, similar to the most recent PET-CT done 2 days ago. This corresponds with a residual large pleural effusion and subtotal collapse of the left lung. No evidence of pneumothorax. There is residual mild mediastinal shift to the right. Underlying right lung nodularity is noted. IMPRESSION: No evidence of pneumothorax following left thoracentesis. Persistent near complete whiteout of the left hemithorax consistent with a large pleural effusion and subtotal left lung collapse as seen on recent PET-CT. Electronically Signed   By: Richardean Sale M.D.   On: 07/24/2019 13:58   DG Chest Port 1V same Day  Result Date: 08/09/2019 CLINICAL DATA:  Post left-sided thoracentesis. EXAM: PORTABLE CHEST 1 VIEW COMPARISON:  Earlier same day; 08/08/2019; 08/07/2019;  chest CT-07/12/2019 FINDINGS: Persistent complete opacification of the left hemithorax following ultrasound-guided thoracentesis. There is persistent deviation of the mediastinal structures to the right. Pulmonary venous congestion within the aerated right lung without new focal airspace opacity. No pneumothorax. No acute osseous abnormalities. IMPRESSION: Persistent complete opacification of the left hemithorax following thoracentesis. No pneumothorax. Electronically Signed   By: Sandi Mariscal M.D.   On: 08/03/2019 11:29   DG Fluoro Guide CV Line-No Report  Result Date: 08/04/2019 Fluoroscopy was utilized by the requesting physician.  No radiographic interpretation.   ECHOCARDIOGRAM COMPLETE  Result Date: 08/02/2019    ECHOCARDIOGRAM REPORT   Patient Name:   [name] Date of Exam: 08/07/2019 Medical Rec #:  [id number]          Height:       73.0 in Accession #:    [id number]         Weight:       226.0 lb Date of Birth:  [birth year]           BSA:          2.266 m Patient Age:    66 years           BP:           86/43 mmHg Patient Gender: M                  HR:           110 bpm. Exam Location:  Inpatient Procedure: 2D Echo, Cardiac Doppler and Color Doppler  STAT ECHO Reported to: Dr Fransico Him on 07/30/2019 1:48:00 PM                      Tamponade. Indications:     I42.9 Cardiomyopathy (unspecified)  History:         Patient has no prior history of Echocardiogram examinations.                  Risk Factors:Hypertension and Dyslipidemia. Lung Cancer.  Sonographer:     Jonelle Sidle Dance Referring Phys:  [id number] Candee Furbish Diagnosing Phys: Fransico Him MD IMPRESSIONS  1. Left ventricular ejection fraction, by estimation, is 60 to 65%. The left ventricle has normal function. The left ventricle has no regional wall motion abnormalities. Left ventricular diastolic parameters are consistent with Grade I diastolic dysfunction (impaired relaxation). Elevated left ventricular end-diastolic  pressure.  2. The right ventricular size is small. Tricuspid regurgitation signal is inadequate for assessing PA pressure.  3. The mitral valve is normal in structure and function. No evidence of mitral valve regurgitation. No evidence of mitral stenosis.  4. The aortic valve is tricuspid. Aortic valve regurgitation is not visualized. Mild to moderate aortic valve sclerosis/calcification is present, without any evidence of aortic stenosis.  5. The inferior vena cava is dilated in size with <50% respiratory variability, suggesting right atrial pressure of 15 mmHg.  6. There is a moderate sized pericardial effusion that is circumferential but greatest at the apex and RV free wall with greatest diamter 1.7cm at the apex. There is diastolic RA inversion. The RV cavity is small and appears underfilled. Suggestive of RV diastolic collapse but images are not ideal. The IVC is dilated and plethoric. There is some MV inflow velocity variation with respirations. Findings worrisome for tamponade. A moderately sized pericardial effusion is present. The pericardial effusion  is circumferential. There is diastolic collapse of the right atrial wall and excessive respiratory variation in the tricuspid valve spectral Doppler velocities.  7. Patient deteriorated during 2D echo and taken to OR for emergent pericardial window by CVTS.  8.  9. 10. 11. 12. 13. 14. 15. 16. 17. 18. 19. 20. 21. FINDINGS  Left Ventricle: Left ventricular ejection fraction, by estimation, is 60 to 65%. The left ventricle has normal function. The left ventricle has no regional wall motion abnormalities. The left ventricular internal cavity size was normal in size. There is  no left ventricular hypertrophy. Left ventricular diastolic parameters are consistent with Grade I diastolic dysfunction (impaired relaxation). Elevated left ventricular end-diastolic pressure. Right Ventricle: The right ventricular size is small. No increase in right ventricular wall  thickness. Right ventricular systolic function is normal. Tricuspid regurgitation signal is inadequate for assessing PA pressure. Left Atrium: Left atrial size was normal in size. Right Atrium: Right atrial size was normal in size. Pericardium: There is a moderate sized pericardial effusion that is circumferential but greatest at the apex and RV free wall with greatest diamter 1.7cm at the apex. There is diastolic RA inversion. The RV cavity is small and appears underfilled. Suggestive of RV diastolic collapse but images are not ideal. The IVC is dilated and plethoric. There is some MV inflow velocity variation with respirations. Findings worrisome for tamponade. A moderately sized pericardial effusion is present. The pericardial effusion is circumferential. There is diastolic collapse of the right atrial wall and excessive respiratory variation in the tricuspid valve spectral Doppler velocities. Mitral Valve: The mitral valve is normal in structure and function. There is mild  calcification of the anterior mitral valve leaflet(s). Normal mobility of the mitral valve leaflets. No evidence of mitral valve regurgitation. No evidence of mitral valve stenosis. Tricuspid Valve: The tricuspid valve is normal in structure. Tricuspid valve regurgitation is not demonstrated. No evidence of tricuspid stenosis. Aortic Valve: The aortic valve is tricuspid. Aortic valve regurgitation is not visualized. Mild to moderate aortic valve sclerosis/calcification is present, without any evidence of aortic stenosis. Pulmonic Valve: The pulmonic valve was normal in structure. Pulmonic valve regurgitation is not visualized. No evidence of pulmonic stenosis. Aorta: The aortic root is normal in size and structure. Venous: The inferior vena cava is dilated in size with less than 50% respiratory variability, suggesting right atrial pressure of 15 mmHg. IAS/Shunts: No atrial level shunt detected by color flow Doppler.  LEFT VENTRICLE PLAX 2D  LVIDd:         3.40 cm  Diastology LVIDs:         2.60 cm  LV e' lateral:   8.27 cm/s LV PW:         0.80 cm  LV E/e' lateral: 6.8 LV IVS:        0.90 cm  LV e' medial:    7.72 cm/s LVOT diam:     2.10 cm  LV E/e' medial:  7.3 LV SV:         77.24 ml LV SV Index:   34.08 LVOT Area:     3.46 cm  IVC IVC diam: 2.20 cm LEFT ATRIUM             Index LA diam:        2.90 cm 1.28 cm/m LA Vol (A2C):   52.4 ml 23.12 ml/m LA Vol (A4C):   38.9 ml 17.17 ml/m LA Biplane Vol: 48.7 ml 21.49 ml/m  AORTIC VALVE LVOT Vmax:   130.00 cm/s LVOT Vmean:  86.200 cm/s LVOT VTI:    0.223 m  AORTA Ao Root diam: 3.20 cm Ao Asc diam:  3.10 cm MITRAL VALVE MV Area (PHT): 3.27 cm    SHUNTS MV Decel Time: 232 msec    Systemic VTI:  0.22 m MV E velocity: 56.30 cm/s  Systemic Diam: 2.10 cm MV A velocity: 69.80 cm/s MV E/A ratio:  0.81 Fransico Him MD Electronically signed by Fransico Him MD Signature Date/Time: 08/12/2019/2:49:56 PM    Final (Updated)    VAS [place] UPPER EXTREMITY VENOUS DUPLEX  Result Date: 08/20/2019 UPPER VENOUS STUDY  Indications: Swelling Risk Factors: None identified. Comparison Study: No prior studies. Performing Technologist: Oliver Hum RVT  Examination Guidelines: A complete evaluation includes B-mode imaging, spectral Doppler, color Doppler, and power Doppler as needed of all accessible portions of each vessel. Bilateral testing is considered an integral part of a complete examination. Limited examinations for reoccurring indications may be performed as noted.  Right Findings: +----------+------------+---------+-----------+----------+-------+ RIGHT     CompressiblePhasicitySpontaneousPropertiesSummary +----------+------------+---------+-----------+----------+-------+ Subclavian    Full       Yes       Yes                      +----------+------------+---------+-----------+----------+-------+  Left Findings: +----------+------------+---------+-----------+----------+-------+ LEFT       CompressiblePhasicitySpontaneousPropertiesSummary +----------+------------+---------+-----------+----------+-------+ IJV           Full       Yes       Yes                      +----------+------------+---------+-----------+----------+-------+ Subclavian  Partial  No        No                Acute  +----------+------------+---------+-----------+----------+-------+ Axillary      None       No        No                Acute  +----------+------------+---------+-----------+----------+-------+ Brachial      None       No        No                Acute  +----------+------------+---------+-----------+----------+-------+ Radial        Full                                          +----------+------------+---------+-----------+----------+-------+ Ulnar         Full                                          +----------+------------+---------+-----------+----------+-------+ Cephalic      Full                                          +----------+------------+---------+-----------+----------+-------+ Basilic       Full                                          +----------+------------+---------+-----------+----------+-------+  Summary:  Right: No evidence of thrombosis in the subclavian.  Left: No evidence of superficial vein thrombosis in the upper extremity. 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  *See table(s) above for measurements and observations.  Diagnosing physician: Ruta Hinds MD Electronically signed by Ruta Hinds MD on 08/20/2019 at 11:49:48 AM.    Final    US THORACENTESIS ASP PLEURAL SPACE W/IMG GUIDE  Result Date: 08/20/2019 INDICATION: Lung Ca Recurrent pleural effusion SOB EXAM: ULTRASOUND GUIDED Left THORACENTESIS MEDICATIONS: 10 cc 1% lidocaine. COMPLICATIONS: None immediate. PROCEDURE: An ultrasound guided thoracentesis was thoroughly discussed  with the patient and questions answered. The benefits, risks, alternatives and complications were also discussed. The patient understands and wishes to proceed with the procedure. Written consent was obtained. Ultrasound was performed to localize and mark an adequate pocket of fluid in the left chest. The area was then prepped and draped in the normal sterile fashion. 1% Lidocaine was used for local anesthesia. Under ultrasound guidance a 19 g Yueh catheter was introduced. Thoracentesis was performed. The catheter was removed and a dressing applied. FINDINGS: A total of approximately 1.5 liters of bloody fluid was removed. Samples were sent to the laboratory as requested by the clinical team. IMPRESSION: Successful ultrasound guided left thoracentesis yielding 1.5 liters of pleural fluid. Read by Lavonia Drafts [facility] Electronically Signed   By: Sandi Mariscal M.D.   On: 07/30/2019 11:17   US THORACENTESIS ASP PLEURAL SPACE W/IMG GUIDE  Result Date: 08/08/2019 INDICATION: Right pleural effusion Recurrent; malignant EXAM: ULTRASOUND GUIDED RIGHT THORACENTESIS MEDICATIONS: 10 cc 1% lidocaine COMPLICATIONS: None immediate. PROCEDURE: An ultrasound guided  thoracentesis was thoroughly discussed with the patient and questions answered. The benefits, risks, alternatives and complications were also discussed. The patient understands and wishes to proceed with the procedure. Written consent was obtained. Ultrasound was performed to localize and mark an adequate pocket of fluid in the right chest. The area was then prepped and draped in the normal sterile fashion. 1% Lidocaine was used for local anesthesia. Under ultrasound guidance a 19 g Yueh catheter was introduced. Thoracentesis was performed. The catheter was removed and a dressing applied. FINDINGS: A total of approximately 1.2 liters of blood tinged fluid was removed. IMPRESSION: Successful ultrasound guided right thoracentesis yielding 1.2 Liters of pleural fluid. Read  by Lavonia Drafts [facility] Electronically Signed   By: Lavonia Dana M.D.   On: 08/08/2019 11:09   US THORACENTESIS ASP PLEURAL SPACE W/IMG GUIDE  Result Date: 07/24/2019 INDICATION: Patient with history of metastatic disease, likely lung carcinoma, dyspnea, COVID-19 positive, left pleural effusion. Request received for diagnostic and therapeutic left thoracentesis. EXAM: ULTRASOUND GUIDED DIAGNOSTIC AND THERAPEUTIC LEFT THORACENTESIS MEDICATIONS: None COMPLICATIONS: None immediate. PROCEDURE: An ultrasound guided thoracentesis was thoroughly discussed with the patient and questions answered. The benefits, risks, alternatives and complications were also discussed. The patient understands and wishes to proceed with the procedure. Written consent was obtained. Ultrasound was performed to localize and mark an adequate pocket of fluid in the left chest. The area was then prepped and draped in the normal sterile fashion. 1% Lidocaine was used for local anesthesia. Under ultrasound guidance a 6 Fr Safe-T-Centesis catheter was introduced. Thoracentesis was performed. The catheter was removed and a dressing applied. FINDINGS: A total of approximately 1.3 liters of blood-tinged fluid was removed. Samples were sent to the laboratory as requested by the clinical team. Due to this being patient's initial thoracentesis only the above amount of fluid was removed today. IMPRESSION: Successful ultrasound guided diagnostic and therapeutic left thoracentesis yielding 1.3 liters of pleural fluid. Read by: Rowe Robert, PA-C Electronically Signed   By: Corrie Mckusick D.O.   On: 07/24/2019 13:45    Microbiology Recent Results (from the past 240 hour(s))  Gram stain     Status: None   Collection Time: 07/30/2019 10:56 AM   Specimen: PATH Cytology Peritoneal fluid  Result Value Ref Range Status   Specimen Description PLEURAL  Final   Gram Stain   Final    NO ORGANISMS SEEN WBC PRESENT, PREDOMINANTLY MONONUCLEAR CYTOSPIN  SMEAR Performed at [facility], [address], [place], [place] [address]    Report Status 08/18/2019 FINAL  Final  Culture, body fluid-bottle     Status: None   Collection Time: 08/14/2019 10:58 AM   Specimen: Pleura  Result Value Ref Range Status   Specimen Description PLEURAL  Final   Special Requests BOTTLES DRAWN AEROBIC AND ANAEROBIC 10CC  Final   Culture   Final    NO GROWTH 5 DAYS Performed at [facility], [address], [place], [place] [address]    Report Status 08/20/2019 FINAL  Final  Surgical pcr screen     Status: None   Collection Time: 08/02/2019  5:37 AM   Specimen: Nasal Mucosa; Nasal Swab  Result Value Ref Range Status   MRSA, PCR NEGATIVE NEGATIVE Final   Staphylococcus aureus NEGATIVE NEGATIVE Final    Comment: (NOTE) The Xpert SA Assay (FDA approved for NASAL specimens in patients 53 years of age and older), is one component of a comprehensive surveillance program. It is not intended to diagnose infection  nor to guide or monitor treatment. Performed at [facility], [place] [address] [place], [place] [address]     Lab Basic Metabolic Panel: Recent Labs  Lab 08/18/19 [phone] 08/18/19 8546 08/07/2019 0226 08/14/2019 0226 08/20/19 0316 08/20/19 0316 08/21/19 0327 08/21/19 0327 08/21/19 1020 08/21/19 2218 08/21/19 2224 2019/09/11 0005 Sep 11, 2019 0554  NA 140   < > 141   < > 139   < > 143   < > 137 141 138 137 140  K 4.7   < > 4.7   < > 4.7   < > 5.1   < > 5.1 5.0 5.1 5.4* 5.3*  CL 103   < > 103  --  104  --  107  --   --  106  --   --  106  CO2 21*   < > 24  --  23  --  17*  --   --  19*  --   --  14*  GLUCOSE 140*   < > 134*  --  115*  --  133*  --   --  130*  --   --  109*  BUN 71*   < > 82*  --  88*  --  99*  --   --  86*  --   --  63*  CREATININE 2.63*   < > 3.27*  --  3.31*  --  3.88*  --   --  3.14*  --   --  2.71*  CALCIUM 12.1*   < > 11.9*  --  10.8*  --  10.4*  --   --  8.9  --   --  8.4*  MG 2.2  --   --   --   --   --   --   --   --  2.3  --    --  2.6*  PHOS  --   --   --   --   --   --   --   --   --  3.6  --   --  4.6   < > = values in this interval not displayed.   Liver Function Tests: Recent Labs  Lab 08/17/19 0616 08/21/19 0327  AST 45* 29  ALT 44 21  ALKPHOS 80 69  BILITOT 1.0 0.9  PROT 5.8* 5.1*  ALBUMIN 3.2* 2.6*   No results for input(s): LIPASE, AMYLASE in the last 168 hours. No results for input(s): AMMONIA in the last 168 hours. CBC: Recent Labs  Lab 08/20/2019 0226 08/01/2019 0226 08/09/2019 1654 08/02/2019 1654 08/20/19 0316 08/20/19 0316 08/21/19 0327 08/21/19 1020 08/21/19 2224 2019-09-11 0005 09/11/2019 0554  WBC 20.4*  --  16.1*  --  16.8*  --  22.0*  --   --   --  11.7*  NEUTROABS  --   --   --   --  15.2*  --  19.8*  --   --   --  10.3*  HGB 15.5   < > 13.1   < > 14.7   < > 14.9 14.6 14.6 13.6 14.2  HCT 47.4   < > 41.1   < > 44.8   < > 45.8 43.0 43.0 40.0 44.1  MCV 94.2  --  94.1  --  92.4  --  92.0  --   --   --  93.8  PLT 171  --  142*  --  141*  --  208  --   --   --  134*   < > = values in this interval not displayed.   Cardiac Enzymes: No results for input(s): CKTOTAL, CKMB, CKMBINDEX, TROPONINI in the last 168 hours. Sepsis Labs: Recent Labs  Lab 08/14/2019 1654 08/20/19 0316 08/21/19 0327 2019/09/12 0554  WBC 16.1* 16.8* 22.0* 11.7*    Procedures/Operations  Pleurx catheter placement on 2/19 Pericardial window on 2/22 Intubated on 2/22 and 2/24, extubated 2/23 and 2/25 Central venous and arterial catheter placement 2/22 Bronchoscopy on 2/24 Hemodialysis catheter within right IJ on 2/24 Cardioversion on 2/24  Patriciaann Clan Sep 12, 2019, 1:49 PM

## 2019-08-26 NOTE — Progress Notes (Addendum)
Palliative:  64 y.o. male  with past medical history of hyperlipidemia, hypertension, recent diagnosis left lung bronchogenic carcinoma and recurrent malignant left pleural effusion, COVID-19 infection admitted on 08/17/2019 with SOB related to large left pleural effusion with mild midline shift and has already required multiple thoracentesis with PleurX placed 2/19. He deteriorated with hypotension and obtunded requiring intubation and underwent pericardial window 2/22 with evidence of fluid causing collapse of RA/RV. Extubated 2/23 but then deteriorated again 2/24 with re-intubation. Plans were for radiation therapy to begin today if he remained stable. Unfortunately he has continued to decline with maximal supportive efforts.   I met again at bedside and RN is in process of preparing for one way extubation and proceeding with comfort care. [name] has continued with significant decline overnight. I adjusted orders in chart to reflect comfort care and liberalization of medication to ensure comfort. I offered support to family at bedside and then privacy after he was extubated. Prognosis is poor and likely only minutes to hours after extubation.   All questions/concerns addressed. Emotional support provided.   Exam: Unresponsive. No distress. Generalized edema. HR 70s. Hypothermic. Synchronous with vent. Extremities cool to touch.   Plan: - Plan for comfort care already decided. Offered support to family and nursing during this process.   [place], NP Palliative Medicine Team Pager [phone] (Please see [email or web address] for schedule) Team Phone [phone]    Greater than 50%  of this time was spent counseling and coordinating care related to the above assessment and plan

## 2019-08-26 NOTE — Progress Notes (Signed)
I spoke with the patient's nurse this am and it sounds as though the patient may transition to comfort care in the near future. We will plan to sign off. Please do not hesitate to let us know if we need to be involved for radiotherapy if the patient's clinical course were to significantly improve.    Carola Rhine, PAC

## 2019-08-26 NOTE — Progress Notes (Signed)
[facility] Progress Note Patient Name: [name] DOB: [birth year] MRN: [id number]   Date of Service  2019/09/19  HPI/Events of Note  ABG on 60%/PRVC 21/TV 630/P 10 = 7.267/38.9/184.0/19  eICU Interventions  Will order: 1. NaHCO3 IV infusion to run at 50 mL/hour 2. NaHCO3 100 meq IV now.  3. Will ask ground team to have face to face [place] discussion with patient's wife who is at bedside as we are rapidly approaching maximal therapy and continues to do poorly.      Intervention Category Major Interventions: Acid-Base disturbance - evaluation and management;Respiratory failure - evaluation and management  Sommer,Steven Cornelia Copa 09/19/19, 12:57 AM

## 2019-08-26 NOTE — Progress Notes (Signed)
[facility] Progress Note  Name: [name] MRN: [id number] DOB: 05-30-1956  Chief Complaint:  Shortness of breath   Subjective:  Started on CRRT last night after nontunneled catheter with primary team.  Patient has had continued decline - now on max levo and neo and was cardioverted overnight for afib with RVR.  Family has been visiting with them and wants to continue with current measures but his wife understands his poor prognosis despite aggressive care.  Blood pressure is 68/32.  He has pulled limited fluid as did not tolerate keep even.    Review of systems:  Unable to obtain secondary to intubated ----------------- Background:  [name] is a 64 y.o. male with a history of stage IV lung cancer, tobacco abuse, and recent covid infection (diagonsed on 1/27 and assessed as being out of the infectious window) who presented to the hospital with shortness of breath.  The stage IV lung cancer is a relatively new diagnosis and has also had recurrent left pleural effusion which has required multiple thoracenteses.  He had a thoracenteses in the ER at [facility] with removal of 1.5 L of bloody fluid.  He has been transferred to [facility] and is in the ICU.   He had a Pleurx catheter placement on 2/9 by thoracic surgery.  His course was complicated by hypotension at that time.  Blood pressures charted as being as low as the 70s.  He received pressors and 3 doses of albumin.  Per pulmonology/critical care, they are trying to get the patient to [facility] for radiation if possible.  Note that his course has also been complicated by hypercalcemia.  Calcium has improved from 13.9 on admission.  He had 450 mL of urine over 2/20.  On arrival to unit, patient found to be acutely hypotensive 26'V systolic and below and obtunded and pulm was just notified of pericardial effusion.  Nursing applying pads to patient.  His wife would want for him to have CRRT when needed.      Intake/Output Summary (Last 24 hours) at 09/19/2019 0545 Last data filed at 19-Sep-2019 0500 Gross per 24 hour  Intake 4441.23 ml  Output 1700 ml  Net 2741.23 ml    Vitals:  Vitals:   September 19, 2019 0430 09-19-2019 0445 September 19, 2019 0500 09-19-19 0515  BP:      Pulse:      Resp: (!) 21 (!) 28 (!) 24 (!) 21  Temp: (!) 92.5 F (33.6 C) (!) 92.3 F (33.5 C) (!) 92.1 F (33.4 C) (!) 91.9 F (33.3 C)  TempSrc:      SpO2:      Weight:      Height:         Physical Exam:    General adult male in bed critically ill on vent  HEENT normocephalic atraumatic Neck supple trachea midline Lungs coarse reduced breath sounds left; clearer on right Heart S1S2   Abdomen softly distended  Extremities LUE with 2+edema; 1+ edema lower extremities neuro - sedation currently running  GU has foley - minimal output  Medications reviewed     Labs:  BMP Latest Ref Rng & Units 19-Sep-2019 08/21/2019 08/21/2019  Glucose 70 - 99 mg/dL - - 130(H)  BUN 8 - 23 mg/dL - - 86(H)  Creatinine 0.61 - 1.24 mg/dL - - 3.14(H)  BUN/Creat Ratio 10 - 24 - - -  Sodium 135 - 145 mmol/L 137 138 141  Potassium 3.5 - 5.1 mmol/L 5.4(H) 5.1 5.0  Chloride 98 - 111 mmol/L - - 106  CO2 22 - 32 mmol/L - - 19(L)  Calcium 8.9 - 10.3 mg/dL - - 8.9    Assessment/Plan:   # Acute renal failure Likely secondary to ischemic and prerenal insults from setting of hypotension with pericardial effusion/tamponade as well as hypercalcemia.  Renal US with echogenic kidneys without hydro.  On lisinopril and HCTZ at home.  His wife would want for him to have RRT if/when needed.  UA neg protein and 0-5 RBC - On CRRT and declining; his wife would like to continue current measures   # Shock cardiogenic  - s/p pericardial window - on levo and neo  # pericardial effusion  - Per CTS; s/p window on 2/22  # Acute hypoxic respiratory failure - on vent Per pulmonology  # Hypercalcemia -Setting of malignancy.  Corrected calcium 11.5 on  2/24 - s/p calcitonin   # Stage IV lung cancer - with malignant effusion    # hx of Covid positive - Test was on 1/27.  Patient now out of infectious window per pulm   Claudia Desanctis, MD 2019-09-12 5:57 AM

## 2019-08-26 NOTE — Procedures (Signed)
Extubation Procedure Note  Patient Details:   Name: [name] DOB: Aug 23, 1955 MRN: [id number]   Airway Documentation:    Vent end date: 08/20/19 Vent end time: 0848   Evaluation  O2 sats: stable throughout Complications: No apparent complications Patient did tolerate procedure well. Bilateral Breath Sounds: Clear   No, pt could not speak post-extubation.  Pt extubated per physician's order in accordance with family's wishes.  Earney Navy 23-Aug-2019, 11:43 AM

## 2019-08-26 NOTE — Plan of Care (Signed)
I spoke with patient's wife at bedside about [name]'s worsening clinical status.  She understands that he is unstable with the maximum amount of life support that we can give and I do not see any reversible in his poor clinical status.  At this point, she thinks that [name] would be happy with the intensive care that he has received, even though his body is not responding well to it.  She understands that he has a very poor prognosis, and we discussed the option of transitioning to comfort care now.  She needs some time to contemplate this and discuss with her children.    Code status: remains DNR

## 2019-08-26 NOTE — Progress Notes (Signed)
Patient's time of death pronounced at 62. Family notified.  Eye prep complete.

## 2019-08-26 DEATH — deceased

## 2019-08-27 ENCOUNTER — Telehealth: Payer: Self-pay | Admitting: Family Medicine

## 2019-08-27 NOTE — Telephone Encounter (Signed)
Funeral home dropped off death certificate. Placed in providers box in office.

## 2019-08-28 NOTE — Telephone Encounter (Signed)
done

## 2019-08-29 ENCOUNTER — Telehealth: Payer: Self-pay | Admitting: Family Medicine

## 2019-08-29 NOTE — Telephone Encounter (Signed)
[employer] faxed over life insurance form to be completed by physician.I wasn't quite sure how to complete form please review and fill in form,date and sign.

## 2019-09-09 ENCOUNTER — Ambulatory Visit: Payer: BC Managed Care – PPO | Admitting: Family Medicine

## 2019-09-16 DIAGNOSIS — Z515 Encounter for palliative care: Secondary | ICD-10-CM

## 2020-07-17 IMAGING — US US THORACENTESIS ASP PLEURAL SPACE W/IMG GUIDE
1 series · 8 of 8 positions shown · non-contrast
Comparison: none

INDICATION: Lung Ca

[Series 1: us thoracentesis asp pleural space w/img guide · 0.24mm/px · 8 of 8 slices shown]
[im 1/8]
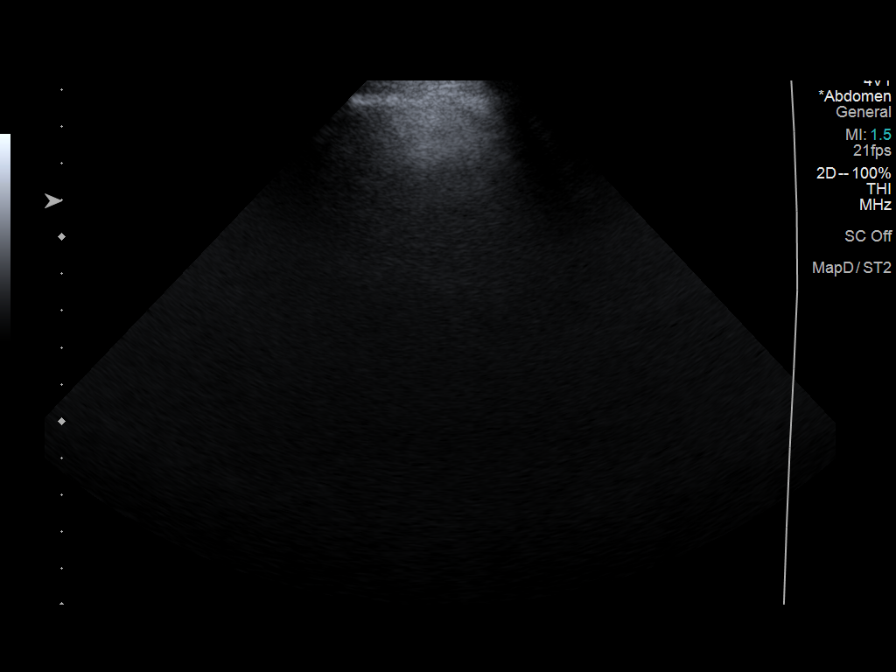
[im 2/8]
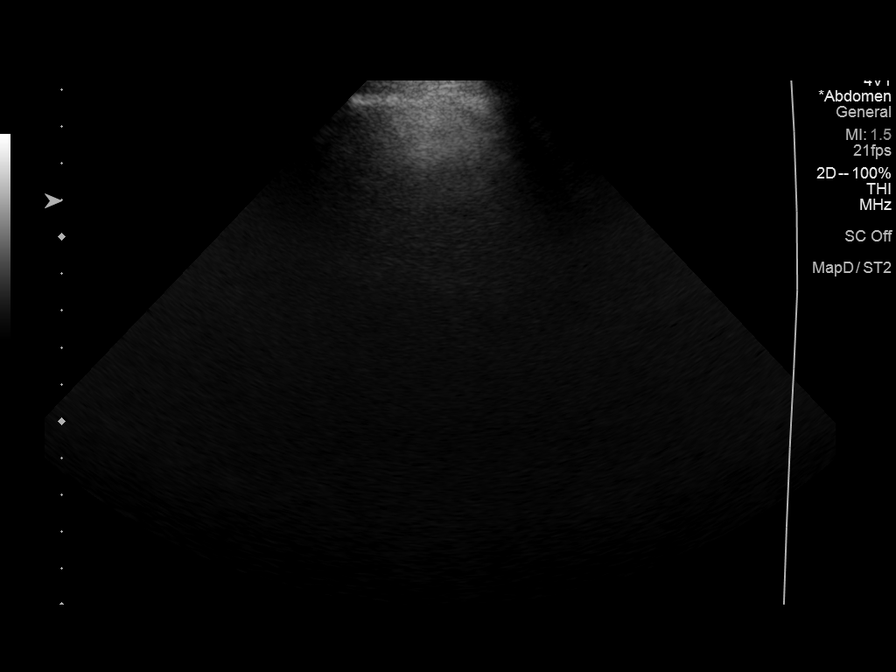
[im 3/8]
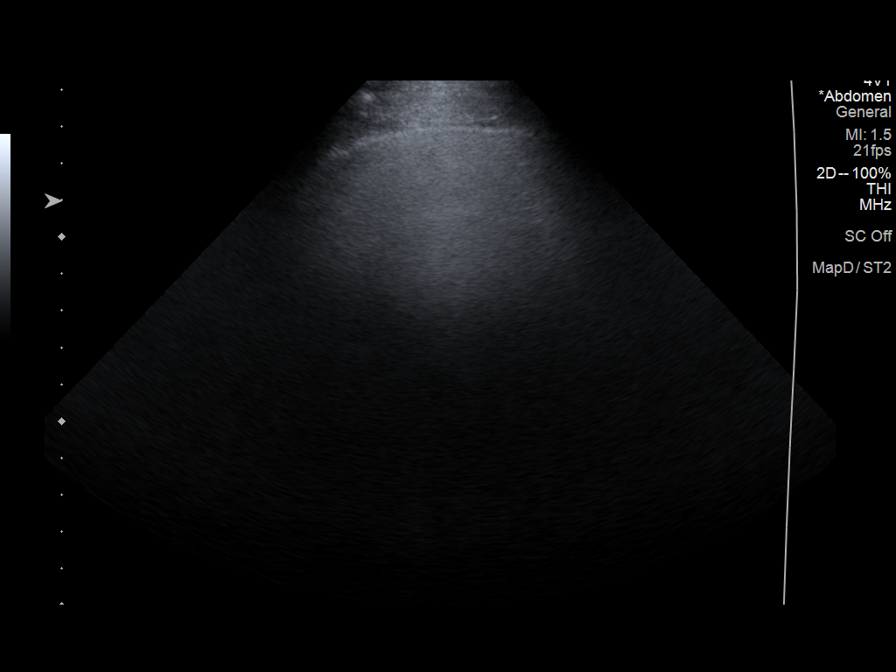
[im 4/8]
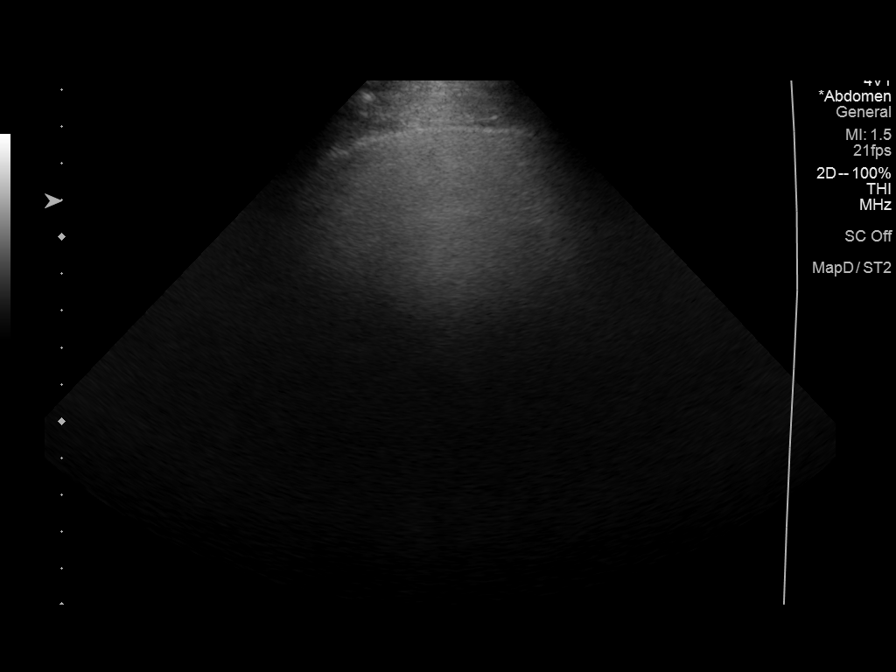
[im 5/8]
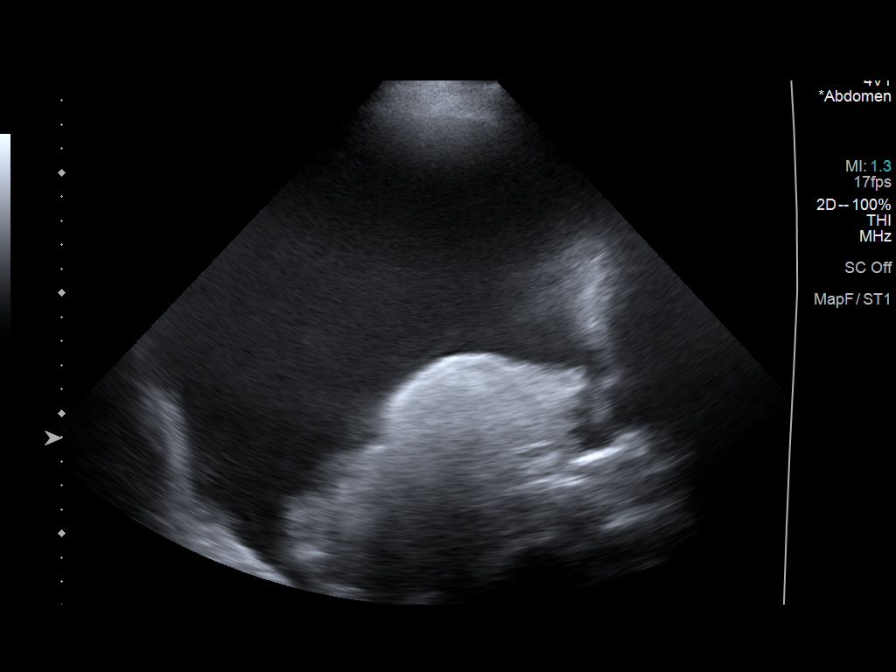
[im 6/8]
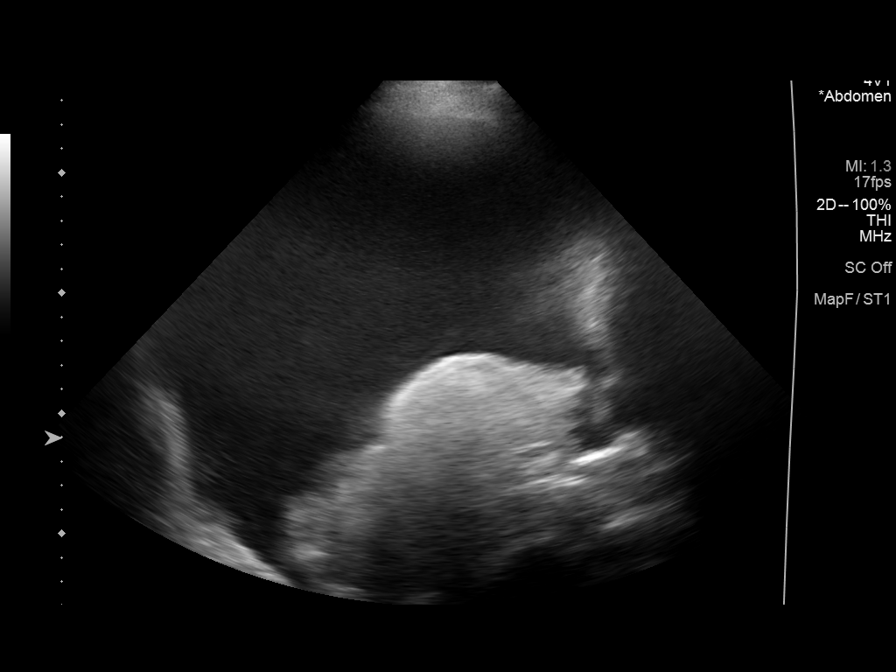
[im 7/8]
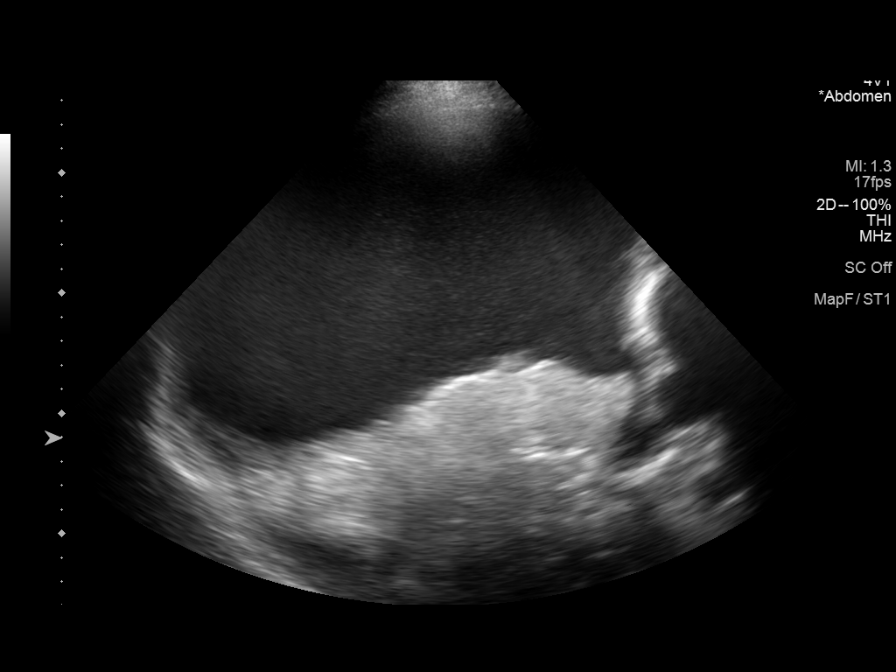
[im 8/8]
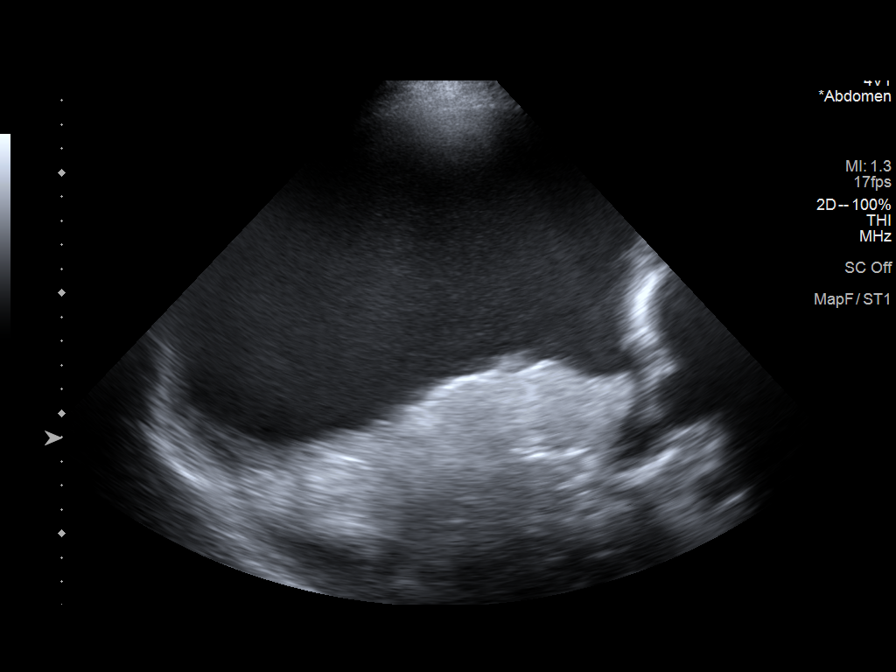

[8 of 8 positions shown; findings below may reference images not displayed]

Recurrent pleural effusion

EXAM:
ULTRASOUND GUIDED Left THORACENTESIS

MEDICATIONS:
10 cc 1% lidocaine.

COMPLICATIONS:
None immediate.

PROCEDURE:
An ultrasound guided thoracentesis was thoroughly discussed with the
patient and questions answered. The benefits, risks, alternatives
and complications were also discussed. The patient understands and
wishes to proceed with the procedure. Written consent was obtained.

Ultrasound was performed to localize and mark an adequate pocket of
fluid in the left chest. The area was then prepped and draped in the
normal sterile fashion. 1% Lidocaine was used for local anesthesia.
Under ultrasound guidance a 19 g Yueh catheter was introduced.
Thoracentesis was performed. The catheter was removed and a dressing
applied.
FINDINGS: A total of approximately 1.5 liters of bloody fluid was removed.
Samples were sent to the laboratory as requested by the clinical
team.
IMPRESSION: Successful ultrasound guided left thoracentesis yielding 1.5 liters
of pleural fluid.

Read by

Yoel Tiger

## 2020-07-17 IMAGING — DX DG CHEST 1V PORT
2 series · 2 of 2 positions shown · non-contrast
Comparison: August 08, 2019

CLINICAL DATA: Shortness of breath.  Lung carcinoma

EXAM:
PORTABLE CHEST 1 VIEW

[chest ap (1 of 2)]
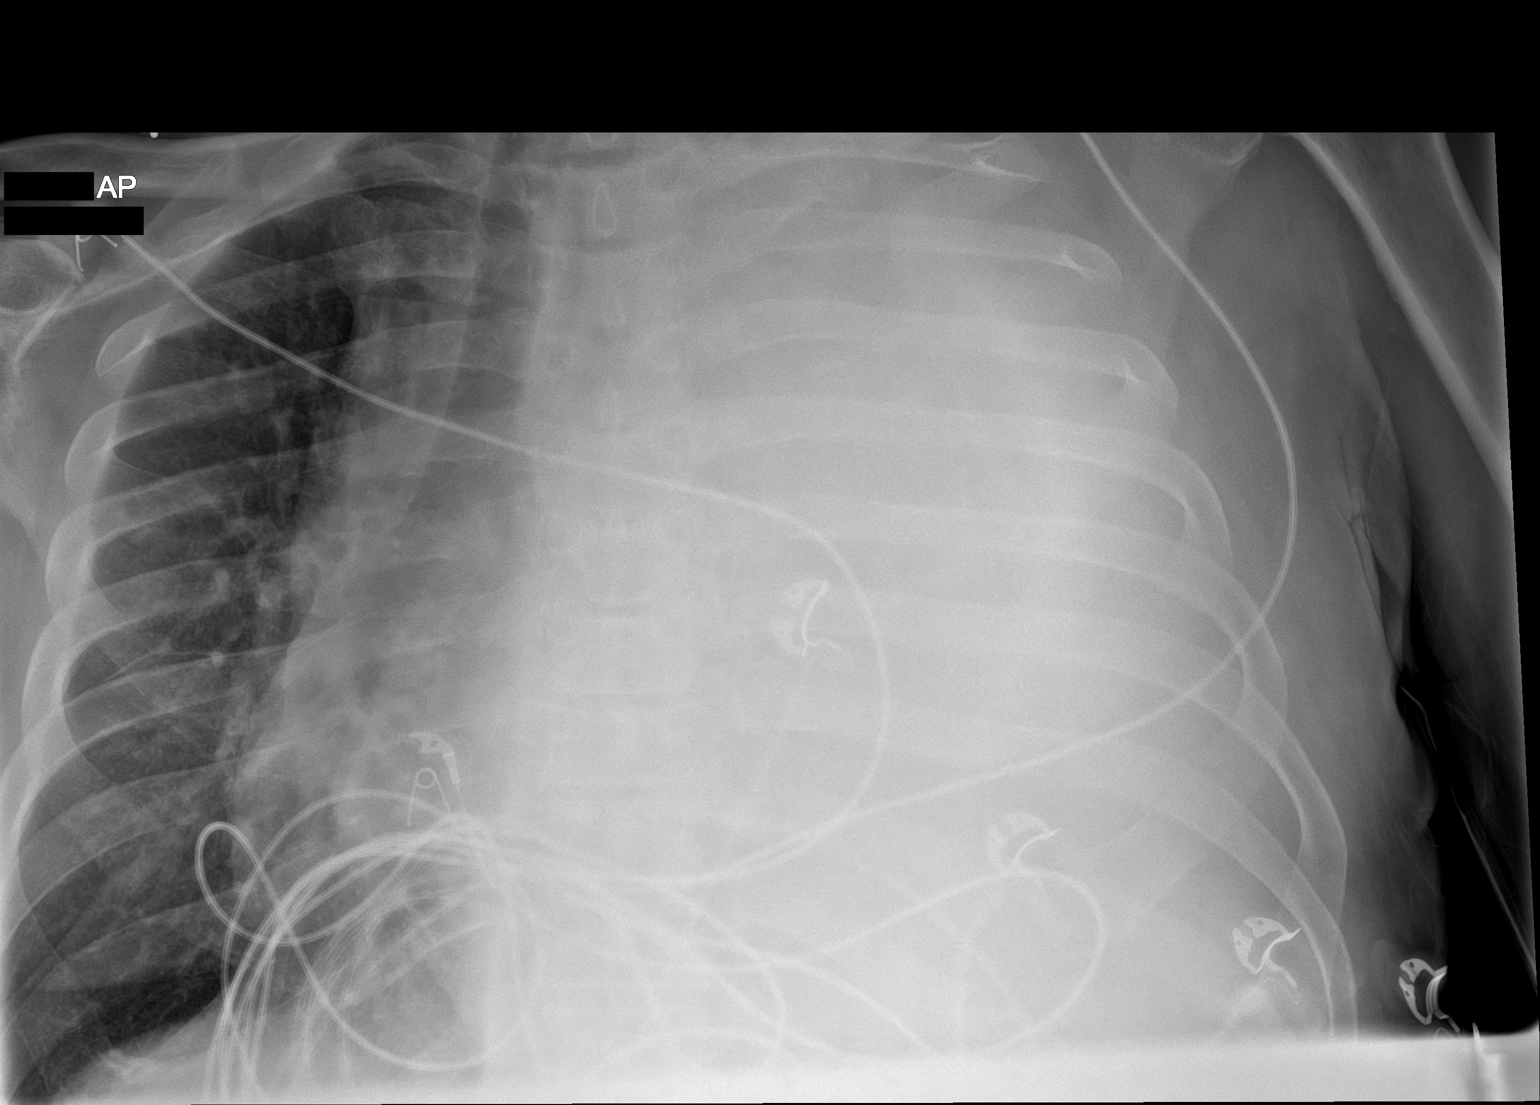

[chest ap (2 of 2)]
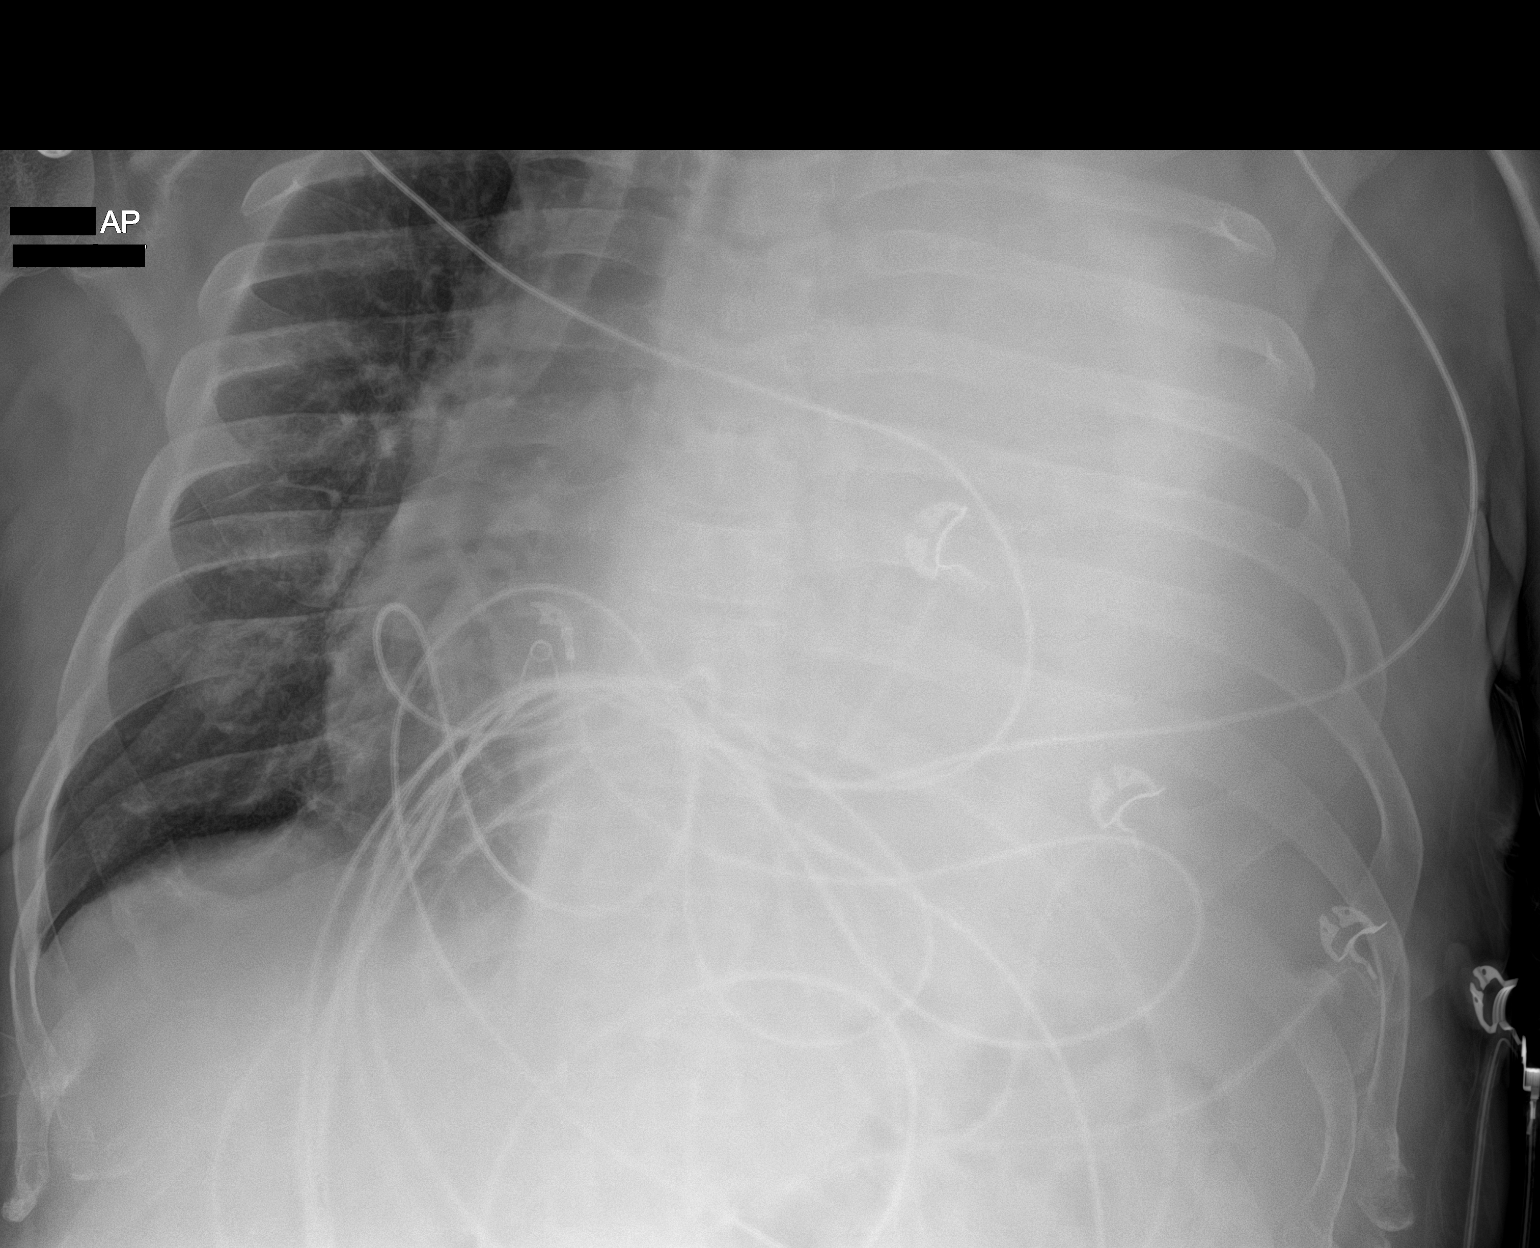

[2 of 2 positions shown; findings below may reference images not displayed]

FINDINGS: There remains essentially complete opacification of the left
hemithorax with shift of heart and mediastinum toward the right.
Right lung remains clear. Heart size is grossly normal. No
adenopathy evident on the right. Note that the left hilar and
mediastinal regions are obscured by the opacification. No bone
lesions.
IMPRESSION: Complete opacification of the left hemithorax with shift of heart
and mediastinum toward the right. Suspect large pleural effusion on
the left. There may well be underlying mass and/or consolidation on
the left. Right lung clear. Grossly stable cardiac silhouette.

## 2020-07-19 IMAGING — US US RENAL
1 series · 14 of 25 positions shown · non-contrast
Comparison: 07/22/2019 PET-CT.

CLINICAL DATA: Acute kidney injury.  Inpatient.

EXAM:
RENAL / URINARY TRACT ULTRASOUND COMPLETE

[Series 1: us renal · 14 of 28 slices shown]
[im 1/28]
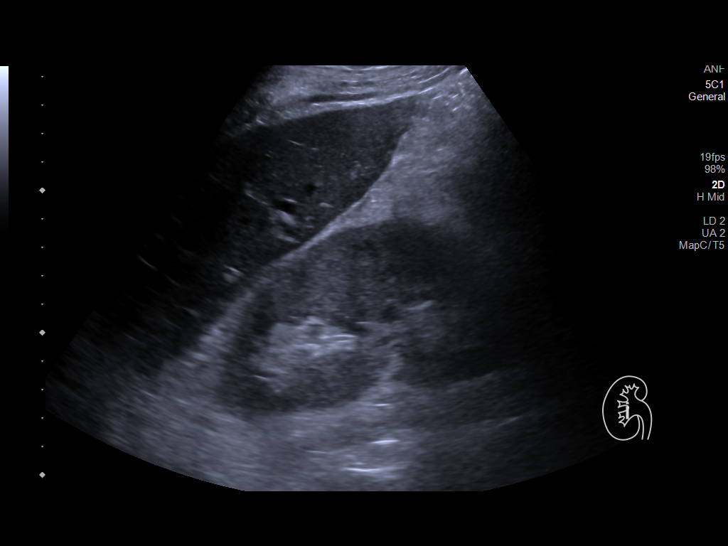
[im 3/28]
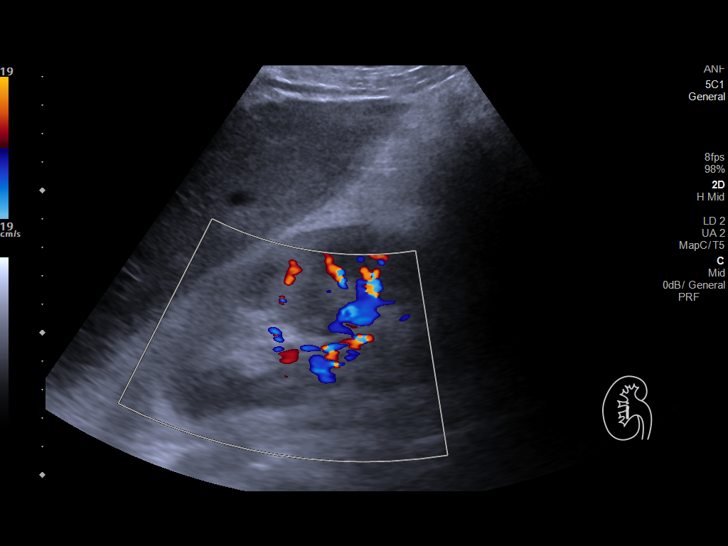
[im 5/28]
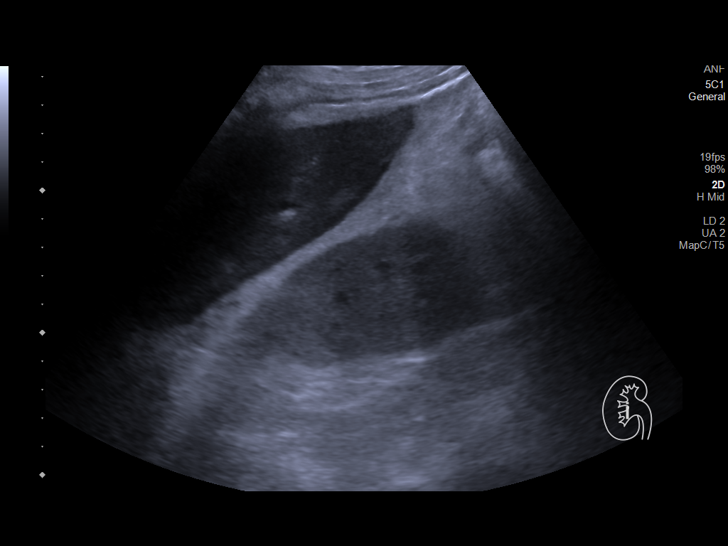
[im 7/28]
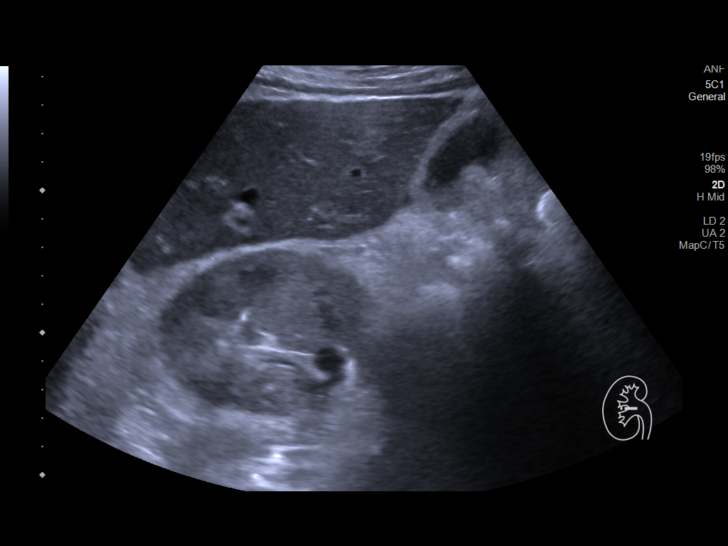
[im 10/28]
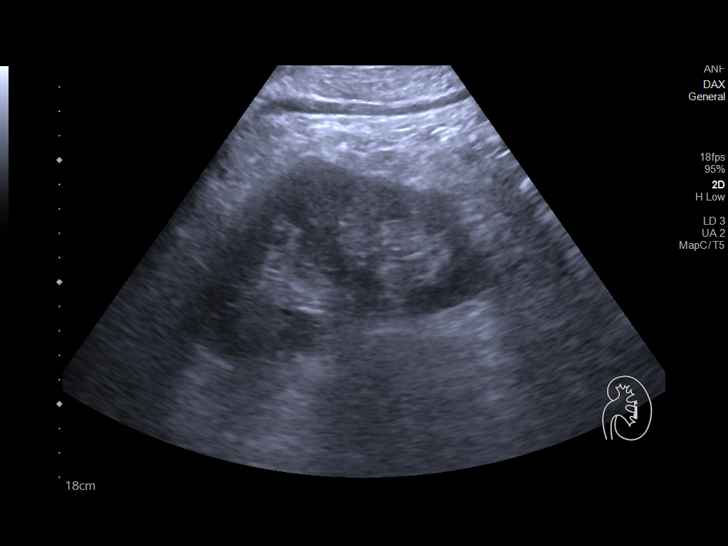
[im 11/28]
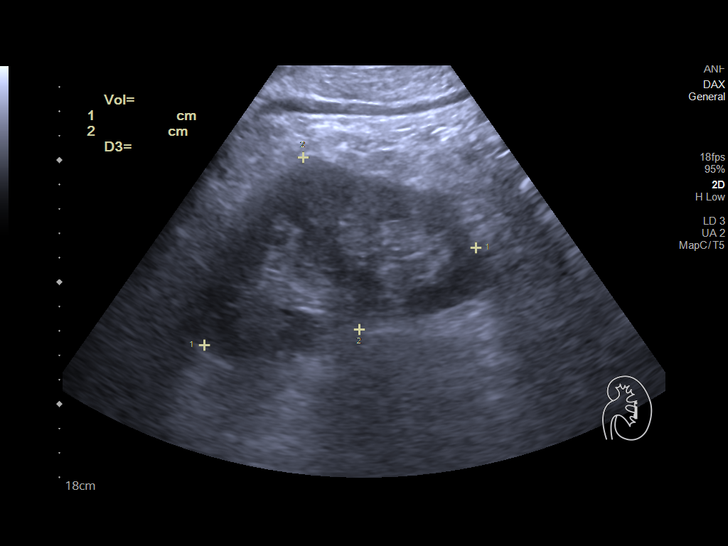
[im 13/28]
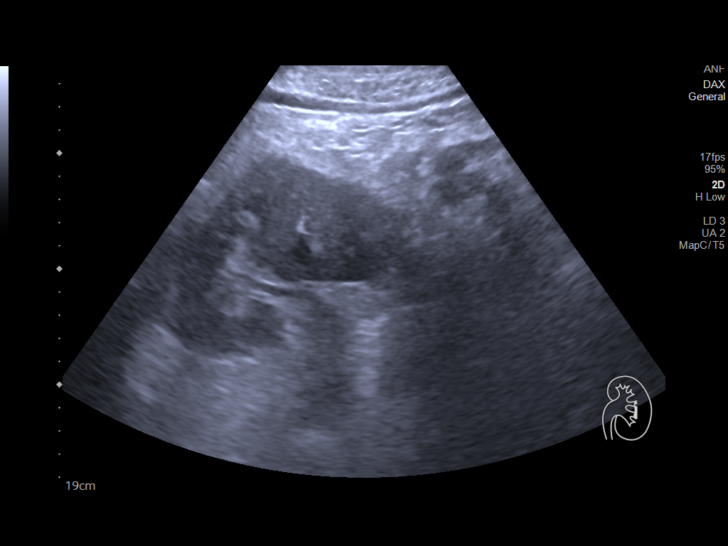
[im 15/28]
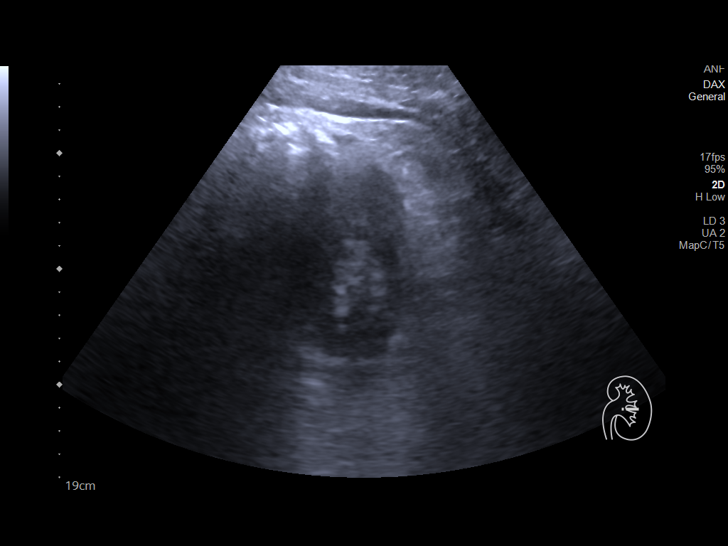
[im 17/28]
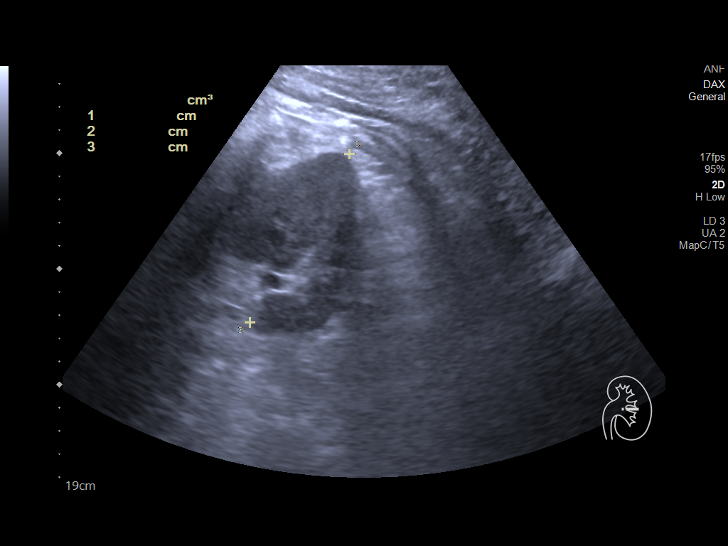
[im 19/28]
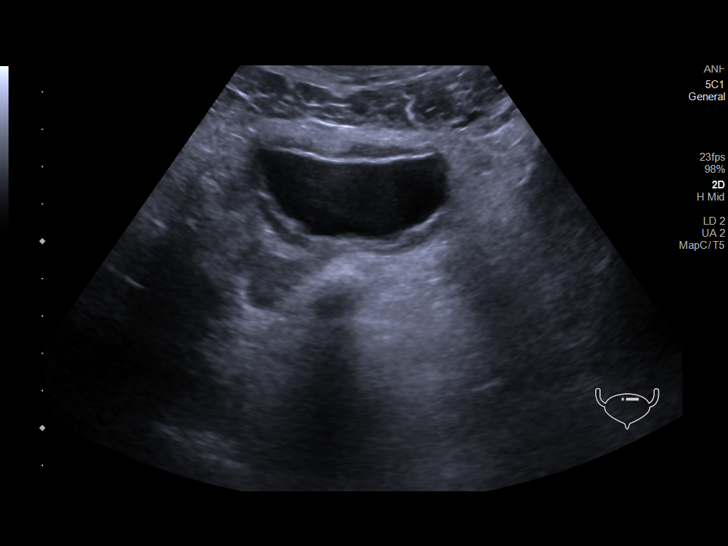
[im 21/28]
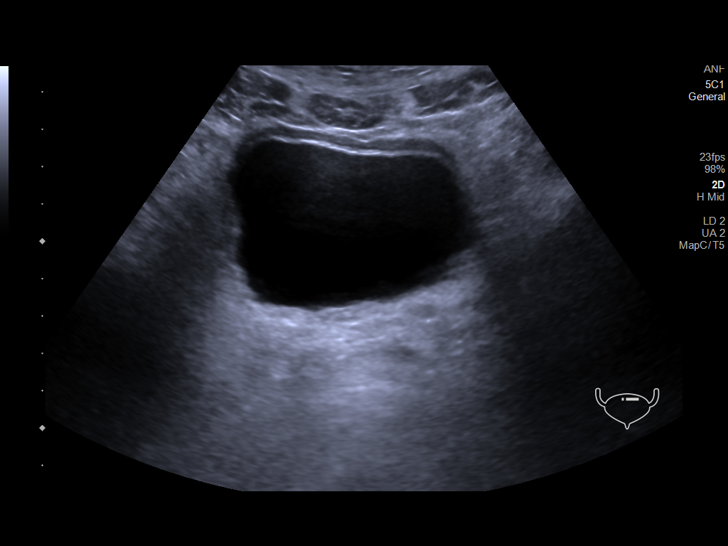
[im 23/28]
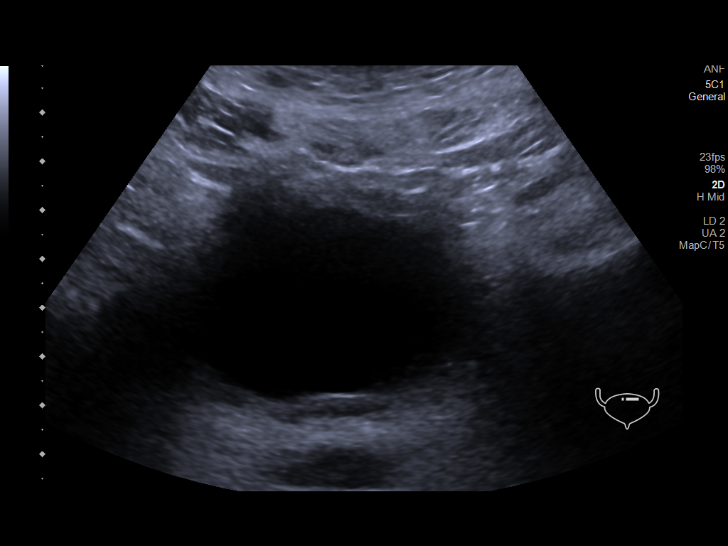
[im 25/28]
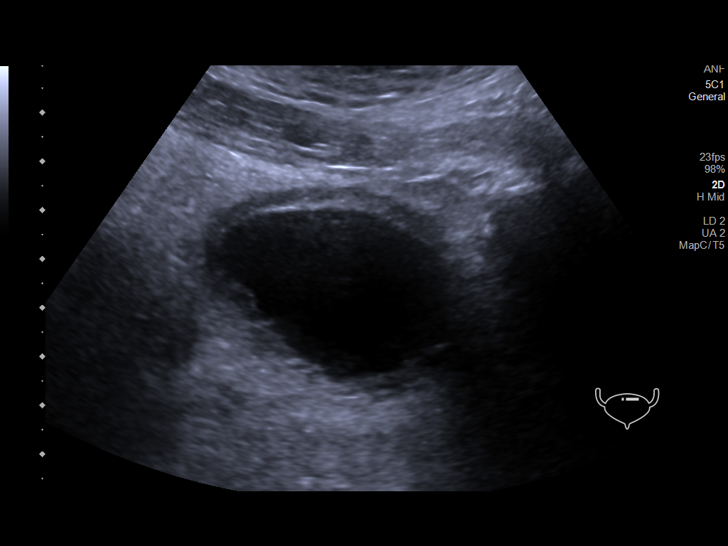
[im 28/28]
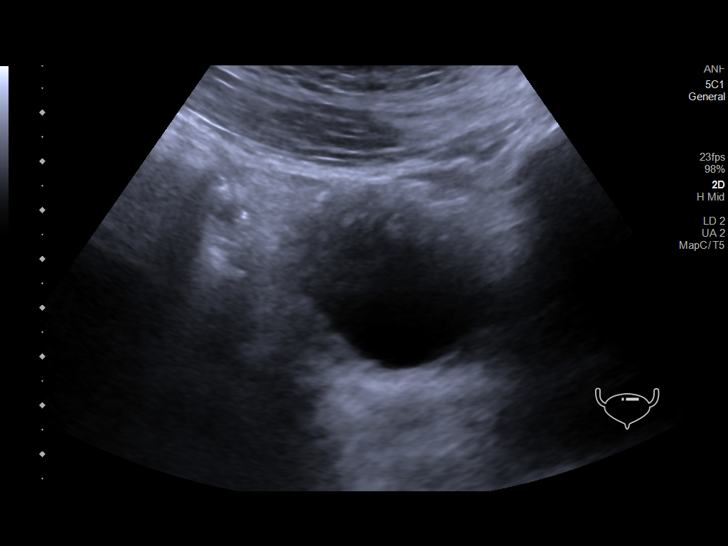

[14 of 25 positions shown; findings below may reference images not displayed]

FINDINGS: Right Kidney:

Renal measurements: 11.7 x 6.1 x 6.7 cm = volume: 249 mL. Echogenic
right renal parenchyma, normal thickness. No hydronephrosis. No
renal mass.

Left Kidney:

Renal measurements: 11.8 x 7.4 x 8.5 cm = volume: 388 mL. Echogenic
left renal parenchyma, normal thickness. No hydronephrosis. No renal
mass.

Bladder:

Nonspecific mild diffuse bladder wall thickening. No focal bladder
abnormality demonstrated.

Other:

None.
IMPRESSION: 1. No hydronephrosis.
2. Echogenic normal size kidneys, compatible with reported history
of nonspecific acute renal parenchymal disease.
3. Nonspecific mild diffuse bladder wall thickening. Suggest
correlation with urinalysis.

## 2020-07-20 IMAGING — DX DG CHEST 1V PORT
1 series · 1 of 1 positions shown · non-contrast
Comparison: Chest radiograph from one day prior.

CLINICAL DATA: Follow-up pleural effusion

EXAM:
PORTABLE CHEST 1 VIEW

[chest ap]
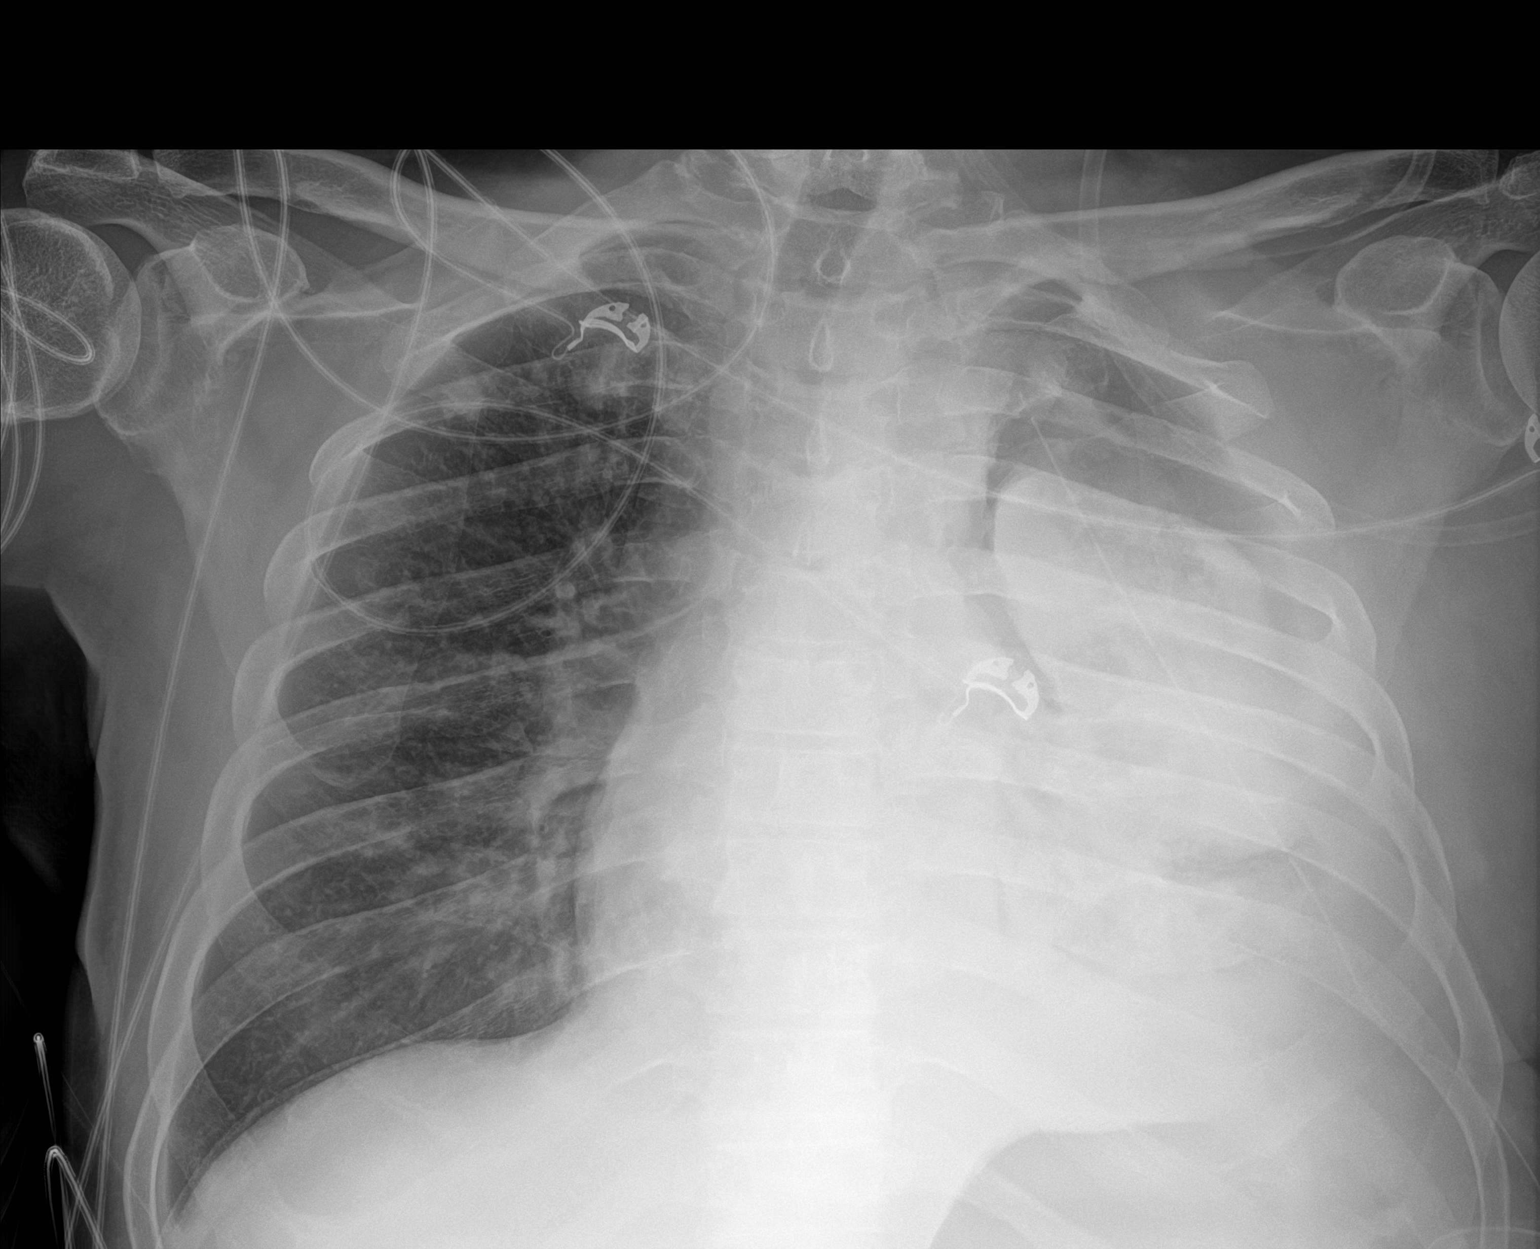

[1 of 1 positions shown; findings below may reference images not displayed]

FINDINGS: Left apical chest tube in place. Stable cardiomediastinal silhouette
with normal heart size. No right pneumothorax. No significant right
pleural effusion. Volume loss in the left hemithorax is similar.
Left hydropneumothorax is overall similar in size with decreased
pleural component and increased pneumothorax component. Upper left
parahilar masslike opacity is newly silhouetted. Stable apical right
lung nodule.
IMPRESSION: 1. Stable apical left chest tube position. Left hydropneumothorax is
similar in size with decreased pleural component and increased
pneumothorax component. Overall volume loss in the left hemithorax
is similar.
2. Newly silhouetted left upper parahilar masslike opacity,
compatible with known neoplasm.
3. Stable apical right lung nodule.
